# Patient Record
Sex: Female | Born: 1975 | Race: Black or African American | Hispanic: No | Marital: Married | Smoking: Never smoker
Health system: Southern US, Community
[De-identification: ages and names within clinical notes are randomized; demographics above are authoritative.]

## PROBLEM LIST (undated history)

## (undated) DIAGNOSIS — M26629 Arthralgia of temporomandibular joint, unspecified side: Secondary | ICD-10-CM

## (undated) DIAGNOSIS — N83209 Unspecified ovarian cyst, unspecified side: Secondary | ICD-10-CM

## (undated) DIAGNOSIS — L209 Atopic dermatitis, unspecified: Secondary | ICD-10-CM

## (undated) DIAGNOSIS — J0101 Acute recurrent maxillary sinusitis: Secondary | ICD-10-CM

## (undated) DIAGNOSIS — G43909 Migraine, unspecified, not intractable, without status migrainosus: Secondary | ICD-10-CM

## (undated) DIAGNOSIS — B019 Varicella without complication: Secondary | ICD-10-CM

## (undated) DIAGNOSIS — F419 Anxiety disorder, unspecified: Secondary | ICD-10-CM

## (undated) HISTORY — DX: Unspecified ovarian cyst, unspecified side: N83.209

## (undated) HISTORY — DX: Anxiety disorder, unspecified: F41.9

## (undated) HISTORY — DX: Migraine, unspecified, not intractable, without status migrainosus: G43.909

## (undated) HISTORY — DX: Atopic dermatitis, unspecified: L20.9

## (undated) HISTORY — DX: Acute recurrent maxillary sinusitis: J01.01

## (undated) HISTORY — DX: Varicella without complication: B01.9

## (undated) HISTORY — DX: Arthralgia of temporomandibular joint, unspecified side: M26.629

---
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
## 2008-08-17 ENCOUNTER — Ambulatory Visit: Payer: Self-pay | Admitting: Obstetrics and Gynecology

## 2008-08-23 ENCOUNTER — Encounter: Payer: Self-pay | Admitting: Obstetrics & Gynecology

## 2008-08-23 ENCOUNTER — Ambulatory Visit: Payer: Self-pay | Admitting: Obstetrics & Gynecology

## 2008-08-24 ENCOUNTER — Encounter: Payer: Self-pay | Admitting: Obstetrics & Gynecology

## 2008-08-30 ENCOUNTER — Ambulatory Visit: Payer: Self-pay | Admitting: Nurse Practitioner

## 2008-08-31 ENCOUNTER — Ambulatory Visit (HOSPITAL_COMMUNITY): Admission: RE | Admit: 2008-08-31 | Discharge: 2008-08-31 | Payer: Self-pay | Admitting: Obstetrics & Gynecology

## 2008-09-15 ENCOUNTER — Ambulatory Visit: Payer: Self-pay | Admitting: Obstetrics and Gynecology

## 2008-09-26 ENCOUNTER — Inpatient Hospital Stay (HOSPITAL_COMMUNITY): Admission: AD | Admit: 2008-09-26 | Discharge: 2008-09-26 | Payer: Self-pay | Admitting: Obstetrics & Gynecology

## 2008-10-08 ENCOUNTER — Ambulatory Visit: Payer: Self-pay | Admitting: Advanced Practice Midwife

## 2008-10-08 ENCOUNTER — Inpatient Hospital Stay (HOSPITAL_COMMUNITY): Admission: AD | Admit: 2008-10-08 | Discharge: 2008-10-09 | Payer: Self-pay | Admitting: Obstetrics and Gynecology

## 2008-10-11 ENCOUNTER — Ambulatory Visit: Payer: Self-pay | Admitting: Family Medicine

## 2008-10-18 ENCOUNTER — Ambulatory Visit: Payer: Self-pay | Admitting: Obstetrics & Gynecology

## 2008-10-21 ENCOUNTER — Inpatient Hospital Stay (HOSPITAL_COMMUNITY): Admission: AD | Admit: 2008-10-21 | Discharge: 2008-10-28 | Payer: Self-pay | Admitting: Family Medicine

## 2008-10-21 ENCOUNTER — Ambulatory Visit: Payer: Self-pay | Admitting: Obstetrics and Gynecology

## 2008-10-24 ENCOUNTER — Encounter: Payer: Self-pay | Admitting: Obstetrics & Gynecology

## 2008-10-28 ENCOUNTER — Encounter: Payer: Self-pay | Admitting: Family Medicine

## 2008-10-29 ENCOUNTER — Other Ambulatory Visit: Payer: Self-pay | Admitting: Emergency Medicine

## 2008-10-29 ENCOUNTER — Ambulatory Visit: Payer: Self-pay | Admitting: Physician Assistant

## 2008-10-29 ENCOUNTER — Inpatient Hospital Stay (HOSPITAL_COMMUNITY): Admission: AD | Admit: 2008-10-29 | Discharge: 2008-10-29 | Payer: Self-pay | Admitting: Obstetrics & Gynecology

## 2008-10-31 ENCOUNTER — Encounter: Payer: Self-pay | Admitting: Family Medicine

## 2008-10-31 ENCOUNTER — Ambulatory Visit (HOSPITAL_COMMUNITY): Admission: RE | Admit: 2008-10-31 | Discharge: 2008-10-31 | Payer: Self-pay | Admitting: Family Medicine

## 2008-11-03 ENCOUNTER — Ambulatory Visit: Payer: Self-pay | Admitting: Obstetrics and Gynecology

## 2008-11-08 ENCOUNTER — Ambulatory Visit: Payer: Self-pay | Admitting: Family Medicine

## 2008-11-08 ENCOUNTER — Encounter: Payer: Self-pay | Admitting: Obstetrics & Gynecology

## 2008-11-08 LAB — CONVERTED CEMR LAB
MCHC: 33.3 g/dL (ref 30.0–36.0)
MCV: 88.7 fL (ref 78.0–100.0)
Platelets: 279 10*3/uL (ref 150–400)
RDW: 13.8 % (ref 11.5–15.5)
WBC: 10.4 10*3/uL (ref 4.0–10.5)

## 2008-11-14 ENCOUNTER — Ambulatory Visit (HOSPITAL_COMMUNITY): Admission: RE | Admit: 2008-11-14 | Discharge: 2008-11-14 | Payer: Self-pay | Admitting: Family Medicine

## 2008-11-16 ENCOUNTER — Ambulatory Visit: Payer: Self-pay | Admitting: Obstetrics & Gynecology

## 2008-11-22 ENCOUNTER — Ambulatory Visit: Payer: Self-pay | Admitting: Obstetrics & Gynecology

## 2008-12-01 ENCOUNTER — Ambulatory Visit: Payer: Self-pay | Admitting: Obstetrics & Gynecology

## 2008-12-08 ENCOUNTER — Ambulatory Visit: Payer: Self-pay | Admitting: Obstetrics & Gynecology

## 2008-12-15 ENCOUNTER — Ambulatory Visit: Payer: Self-pay | Admitting: Obstetrics & Gynecology

## 2008-12-21 ENCOUNTER — Ambulatory Visit: Payer: Self-pay | Admitting: Obstetrics and Gynecology

## 2009-01-05 ENCOUNTER — Ambulatory Visit: Payer: Self-pay | Admitting: Obstetrics & Gynecology

## 2009-01-06 ENCOUNTER — Encounter: Payer: Self-pay | Admitting: Obstetrics & Gynecology

## 2009-01-06 LAB — CONVERTED CEMR LAB: GC Probe Amp, Genital: NEGATIVE

## 2009-01-09 ENCOUNTER — Ambulatory Visit: Payer: Self-pay | Admitting: Obstetrics and Gynecology

## 2009-01-12 ENCOUNTER — Ambulatory Visit: Payer: Self-pay | Admitting: Obstetrics & Gynecology

## 2009-01-19 ENCOUNTER — Ambulatory Visit: Payer: Self-pay | Admitting: Obstetrics & Gynecology

## 2009-01-24 ENCOUNTER — Ambulatory Visit: Payer: Self-pay | Admitting: Obstetrics & Gynecology

## 2009-01-24 ENCOUNTER — Inpatient Hospital Stay (HOSPITAL_COMMUNITY): Admission: AD | Admit: 2009-01-24 | Discharge: 2009-01-26 | Payer: Self-pay | Admitting: Obstetrics & Gynecology

## 2009-01-28 ENCOUNTER — Encounter: Admission: RE | Admit: 2009-01-28 | Discharge: 2009-02-26 | Payer: Self-pay | Admitting: Obstetrics & Gynecology

## 2009-01-30 ENCOUNTER — Ambulatory Visit: Admission: RE | Admit: 2009-01-30 | Discharge: 2009-01-30 | Payer: Self-pay | Admitting: Obstetrics & Gynecology

## 2009-02-02 ENCOUNTER — Ambulatory Visit: Payer: Self-pay | Admitting: Family Medicine

## 2009-02-02 LAB — CONVERTED CEMR LAB
ALT: 24 units/L (ref 0–35)
AST: 16 units/L (ref 0–37)
Alkaline Phosphatase: 132 units/L — ABNORMAL HIGH (ref 39–117)
BUN: 13 mg/dL (ref 6–23)
Creatinine, Ser: 0.89 mg/dL (ref 0.40–1.20)
HCT: 40.8 % (ref 36.0–46.0)
MCHC: 34.1 g/dL (ref 30.0–36.0)
MCV: 88.9 fL (ref 78.0–100.0)
Platelets: 308 10*3/uL (ref 150–400)
RDW: 14.3 % (ref 11.5–15.5)
Total Bilirubin: 0.7 mg/dL (ref 0.3–1.2)

## 2009-02-08 ENCOUNTER — Ambulatory Visit: Payer: Self-pay | Admitting: Obstetrics and Gynecology

## 2009-02-20 ENCOUNTER — Telehealth: Payer: Self-pay | Admitting: Family Medicine

## 2009-02-21 ENCOUNTER — Ambulatory Visit: Payer: Self-pay | Admitting: Family Medicine

## 2009-02-21 DIAGNOSIS — J0100 Acute maxillary sinusitis, unspecified: Secondary | ICD-10-CM | POA: Insufficient documentation

## 2009-02-21 DIAGNOSIS — S335XXA Sprain of ligaments of lumbar spine, initial encounter: Secondary | ICD-10-CM | POA: Insufficient documentation

## 2009-02-22 ENCOUNTER — Telehealth: Payer: Self-pay | Admitting: Family Medicine

## 2009-02-22 LAB — CONVERTED CEMR LAB
ALT: 18 units/L (ref 0–35)
AST: 21 units/L (ref 0–37)
Albumin: 3.8 g/dL (ref 3.5–5.2)
Alkaline Phosphatase: 93 units/L (ref 39–117)
BUN: 11 mg/dL (ref 6–23)
CO2: 31 meq/L (ref 19–32)
Chloride: 105 meq/L (ref 96–112)
Cholesterol: 195 mg/dL (ref 0–200)
Creatinine, Ser: 0.8 mg/dL (ref 0.4–1.2)
LDL Cholesterol: 139 mg/dL — ABNORMAL HIGH (ref 0–99)
Potassium: 4.1 meq/L (ref 3.5–5.1)
VLDL: 9.8 mg/dL (ref 0.0–40.0)

## 2009-02-23 ENCOUNTER — Telehealth: Payer: Self-pay | Admitting: Family Medicine

## 2009-03-08 ENCOUNTER — Encounter: Payer: Self-pay | Admitting: Family Medicine

## 2009-03-09 ENCOUNTER — Telehealth: Payer: Self-pay | Admitting: Family Medicine

## 2009-03-09 ENCOUNTER — Ambulatory Visit: Payer: Self-pay | Admitting: Obstetrics and Gynecology

## 2009-03-09 ENCOUNTER — Encounter: Payer: Self-pay | Admitting: Family Medicine

## 2009-03-09 DIAGNOSIS — M542 Cervicalgia: Secondary | ICD-10-CM | POA: Insufficient documentation

## 2009-03-09 LAB — CONVERTED CEMR LAB
Hemoglobin: 14.4 g/dL (ref 12.0–15.0)
MCHC: 36.2 g/dL — ABNORMAL HIGH (ref 30.0–36.0)
MCV: 84.3 fL (ref 78.0–100.0)
RBC: 4.72 M/uL (ref 3.87–5.11)
WBC: 5.3 10*3/uL (ref 4.0–10.5)

## 2009-03-21 ENCOUNTER — Encounter: Payer: Self-pay | Admitting: Family Medicine

## 2009-03-30 ENCOUNTER — Encounter: Admission: RE | Admit: 2009-03-30 | Discharge: 2009-04-29 | Payer: Self-pay | Admitting: Obstetrics & Gynecology

## 2009-04-21 ENCOUNTER — Telehealth: Payer: Self-pay | Admitting: Family Medicine

## 2009-04-30 ENCOUNTER — Encounter: Admission: RE | Admit: 2009-04-30 | Discharge: 2009-05-30 | Payer: Self-pay | Admitting: Obstetrics & Gynecology

## 2009-06-28 ENCOUNTER — Telehealth: Payer: Self-pay | Admitting: Family Medicine

## 2009-06-29 ENCOUNTER — Ambulatory Visit: Payer: Self-pay | Admitting: Family Medicine

## 2009-06-29 ENCOUNTER — Encounter: Admission: RE | Admit: 2009-06-29 | Discharge: 2009-07-29 | Payer: Self-pay | Admitting: Obstetrics & Gynecology

## 2009-06-29 DIAGNOSIS — S139XXA Sprain of joints and ligaments of unspecified parts of neck, initial encounter: Secondary | ICD-10-CM | POA: Insufficient documentation

## 2009-07-03 ENCOUNTER — Telehealth: Payer: Self-pay | Admitting: Family Medicine

## 2009-07-13 ENCOUNTER — Telehealth: Payer: Self-pay | Admitting: Family Medicine

## 2009-07-14 ENCOUNTER — Encounter (INDEPENDENT_AMBULATORY_CARE_PROVIDER_SITE_OTHER): Payer: Self-pay | Admitting: *Deleted

## 2009-07-20 ENCOUNTER — Encounter: Payer: Self-pay | Admitting: Family Medicine

## 2009-07-21 ENCOUNTER — Telehealth: Payer: Self-pay | Admitting: Family Medicine

## 2009-07-21 ENCOUNTER — Encounter: Payer: Self-pay | Admitting: Family Medicine

## 2009-07-30 ENCOUNTER — Encounter
Admission: RE | Admit: 2009-07-30 | Discharge: 2009-08-09 | Payer: Self-pay | Source: Home / Self Care | Admitting: Obstetrics & Gynecology

## 2009-08-01 ENCOUNTER — Encounter: Payer: Self-pay | Admitting: Family Medicine

## 2009-08-02 ENCOUNTER — Encounter: Payer: Self-pay | Admitting: Family Medicine

## 2009-08-08 ENCOUNTER — Encounter: Payer: Self-pay | Admitting: Family Medicine

## 2010-01-09 ENCOUNTER — Ambulatory Visit: Payer: Self-pay | Admitting: Nurse Practitioner

## 2010-04-03 ENCOUNTER — Ambulatory Visit
Admission: RE | Admit: 2010-04-03 | Discharge: 2010-04-03 | Payer: Self-pay | Source: Home / Self Care | Attending: Nurse Practitioner | Admitting: Nurse Practitioner

## 2010-04-03 NOTE — Progress Notes (Signed)
Summary: ? Bad cold  Phone Note Call from Patient Call back at Home Phone [phone]   Caller: Patient Call For: Ruthe Mannan MD Summary of Call: Patient called and stated that she has a bad cold.  She has no fever, no sore throat, body aches, chills, no nausea, no vomiting, no diarrhea.  Leaving to go out of town on "Sunday and wanted to know if Dr. Aron could call her in another Zpak because that is what she had before and it worked.  Advised that we usually do not call in antibiotics without seeing the patient first but would sent the message to Dr. Aron. [facility] Initial call taken by: Nikki Thorpe CMA (AAMA),  April 21, 2009 11:28 AM  Follow-up for Phone Call        I don't typicall do that but since she is going out of town, will do this ONE time.  Needs to be evaluated if worsening sputum production, fevers, or shortness of breath. Follow-up by: Talia Aron MD,  April 21, 2009 11:41 AM    New/Updated Medications: AZITHROMYCIN 250 MG  TABS (AZITHROMYCIN) 2 by  mouth today and then 1 daily for 4 days Prescriptions: AZITHROMYCIN 250 MG  TABS (AZITHROMYCIN) 2 by  mouth today and then 1 daily for 4 days  #6 x 0   Entered and Authorized by:   Talia Aron MD   Signed by:   Talia Aron MD on 04/21/2009   Method used:   Electronically [facility]        [facility] [id number]       [address]       [place], [place]  [address]       Ph: [phone] or [phone]       Fax: [phone]   RxID:   [id number]   Appended Document: ? Bad cold Patient Advised.

## 2010-04-03 NOTE — Miscellaneous (Signed)
Summary: PT Certification/[facility]  PT Certification/[facility]   Imported By: Lanelle Bal 08/07/2009 10:42:25  _____________________________________________________________________  External Attachment:    Type:   Image     Comment:   External Document

## 2010-04-03 NOTE — Progress Notes (Signed)
Summary: Muscle spasms  Phone Note Call from Patient Call back at Home Phone [phone]   Caller: Patient Call For: Ruthe Mannan MD Summary of Call: Patient say that for the last several days she has been having muscle spasms in her neck.  Doesn't remember pulling or injuring it in any way.  She has been taking Tylenol and Flexeril but neither are helping the pain.  Taking long hot showers and using a heating pad and still no relief.  [facility].  Please advise. Initial call taken by: Linde Gillis CMA Duncan Dull),  June 28, 2009 11:46 AM  Follow-up for Phone Call        If muscle relaxants, NSAIDs and hot showers not helping much, no other meds that could really help at this point.  Needs to be seen. Ruthe Mannan MD  June 28, 2009 11:56 AM   Additional Follow-up for Phone Call Additional follow up Details #1::        Patient advised as instructed, scheduled her to see Dr. Dayton Martes tomorrow at 2:45. Additional Follow-up by: Linde Gillis CMA Duncan Dull),  June 28, 2009 1:05 PM

## 2010-04-03 NOTE — Miscellaneous (Signed)
Summary: PT Discharge/[facility]  PT Discharge/ [facility]   Imported By: Lanelle Bal 08/16/2009 09:56:02  _____________________________________________________________________  External Attachment:    Type:   Image     Comment:   External Document

## 2010-04-03 NOTE — Progress Notes (Signed)
Summary: neck pain is not any better  Phone Note Call from Patient Call back at Home Phone [phone]   Caller: Patient Call For: Ruthe Mannan MD Summary of Call: Pt is taking soma and vicodin for muscle spasms in her neck.  She said this is not helping, she has not seen any improvement in her pain.  [facility]. Initial call taken by: Lowella Petties CMA,  Jul 03, 2009 9:42 AM  Follow-up for Phone Call        We could try PT next if she is interested. Ruthe Mannan MD  Jul 03, 2009 9:43 AM  Patient Advised.  She would like to do PT.  Please send referral to [facility].  Patient is coming into the office later this a.m. to bring her daughter.  Lugene Fuquay CMA Duncan Dull)  Jul 03, 2009 10:34 AM

## 2010-04-03 NOTE — Progress Notes (Signed)
Summary: requests refill on soma  Phone Note Call from Patient Call back at Home Phone [phone]   Caller: Patient Summary of Call: Pt has been taking soma for neck pain but she is out.  She still has the pain but it is getting better, she will be starting PT next week.  She is asking if refill on soma can be [facility]. Initial call taken by: Lowella Petties CMA,  Jul 13, 2009 12:30 PM  Follow-up for Phone Call        Skelaxin was called in 10 days ago. (another muscle relaxant)  I don't routinely use Soma.   OK to use Skelaxin and continue with supportive care for now. Will forward to Dr. Dayton Martes to see if she is comfortable refilling SOMA (controlled substance) Follow-up by: Hannah Beat MD,  Jul 13, 2009 12:37 PM  Additional Follow-up for Phone Call Additional follow up Details #1::        Patient says that the Skelaxin did not work for her and thats why she wants the [place] refill.Consuello Masse CMA  Additional Follow-up by: Benny Lennert CMA Duncan Dull),  Jul 13, 2009 2:50 PM    Additional Follow-up for Phone Call Additional follow up Details #2::    No. Controlled and highly abused substance. Will have to wait for Dr. Dayton Martes to refill.  cc: Dr. Dayton Martes Follow-up by: Hannah Beat MD,  Jul 13, 2009 2:56 PM  Additional Follow-up for Phone Call Additional follow up Details #3:: Details for Additional Follow-up Action Taken: Pt called back, is angry that soma has been denied.  She doesnt want anything else, says skelaxin and flexeril dont  work.  Insisting on soma.  She says she will come in for appt if she needs to.               Lowella Petties CMA  Jul 14, 2009 8:55 AM  Spoke with dr Ermalene Searing she is agreeable to fill temporary rx of soma as long as patient brings skelaxin back in.Consuello Masse CMA   Spoke with patient and phyllis also about office policy and procedure she will bring medication (skelaxin) to off and we will refill soma until dr Dayton Martes is  back.Consuello Masse CMA   Patient came in office and returned 28 of 30 skelaxin in original bottle. Dr. Ermalene Searing gave 15 pills until dr Dayton Martes returns.Consuello Masse CMA  Additional Follow-up by: Benny Lennert CMA Duncan Dull),  Jul 14, 2009 4:34 PM  Prescriptions: SOMA 250 MG TABS (CARISOPRODOL) 1 tab by mouth three times a day and at bedtime.  #15 x 0   Entered and Authorized by:   Kerby Nora MD   Signed by:   Kerby Nora MD on 07/14/2009   Method used:   Print then Give to Patient   RxID:   [id number]   Appended Document: requests refill on soma Patient was educated on these two medications not supposed to be taken together patietn took pills with her and took to pharmacy to see if they would take them back. She called back and said that pharamcy would not take medication back and she was going to discard this medication her self. Patient was told not to take the medications together.Consuello Masse CMA

## 2010-04-03 NOTE — Miscellaneous (Signed)
Summary: PT Certification/[facility]  PT Certification/[facility]   Imported By: Lanelle Bal 07/28/2009 10:14:23  _____________________________________________________________________  External Attachment:    Type:   Image     Comment:   External Document
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
## 2010-04-03 NOTE — Progress Notes (Signed)
Summary: soma  Phone Note Refill Request Call back at Home Phone [phone] Message from:  Patient on Jul 21, 2009 11:23 AM  Refills Requested: Medication #1:  SOMA 250 MG TABS 1 tab by mouth three times a day and at bedtime.  Method Requested: Pick up at Office Initial call taken by: Melody Comas,  Jul 21, 2009 11:23 AM  Follow-up for Phone Call        Rx called to pharmacy, patient notified.   Follow-up by: Linde Gillis CMA Duncan Dull),  Jul 21, 2009 11:50 AM    Prescriptions: SOMA 250 MG TABS (CARISOPRODOL) 1 tab by mouth three times a day and at bedtime.  #30 x 0   Entered and Authorized by:   Ruthe Mannan MD   Signed by:   Ruthe Mannan MD on 07/21/2009   Method used:   Telephoned to ...       [facility] [facility]       [address]       [place], [place]  [address]       Ph: [phone] or [phone]       Fax: [phone]   RxID:   [id number]

## 2010-04-03 NOTE — Letter (Signed)
Summary: Generic Letter  [facility]  [address] [place], [place] [address]   Phone: [phone]  Fax: [phone]    07/14/2009  [name] [address] [name], [place]  [address]  To Whom it may concern,   The above named patient returned a prescription of Skelaxin 800mg  to our office at [facility] in [place]. The Patient had only taken to tablets out of the prescription and come to find that they did not work for her. Patient had to be perscribe another rx that does work for her. Again 28 of the 30 tablets in the rx had been returned and counted in the present of the patient. If you have any furthur questions please contact our office at [phone].          Sincerely,   Benny Lennert CMA (AAMA)

## 2010-04-03 NOTE — Assessment & Plan Note (Signed)
Summary: MUSCLE SPASMS/NT   Vital Signs:  Patient profile:   35 year old female Height:      65.5 inches Weight:      178.25 pounds BMI:     29.32 Temp:     98.3 degrees F oral Pulse rate:   80 / minute Pulse rhythm:   regular BP sitting:   112 / 70  (left arm) Cuff size:   regular  Vitals Entered By: Delilah Shan CMA Duncan Dull) (June 29, 2009 3:04 PM) CC: Muscle spasm   History of Present Illness: 35 yo here for acute muscle spasm.  Woke up with sore right side of neck 1 week ago.  Hurts to turn head to right or any sudden movements. No radiculopathy or focal neurological deficits. Has tried Flexeril 10 mg and Tylenol with no relief of symptoms.  Current Medications (verified): 1)  Prenavite Multiple Vitamin 28-0.8 Mg Tabs (Prenatal Vit-Fe Fumarate-Fa) .... Take 1 Tablet By Mouth Once A Day 2)  Soma 250 Mg Tabs (Carisoprodol) .Marland Kitchen.. 1 Tab By Mouth Three Times A Day and At Bedtime. 3)  Vicodin 5-500 Mg Tabs (Hydrocodone-Acetaminophen) .Marland Kitchen.. 1 Tab Every Six Hours As Needed For Pain.  Allergies: 1)  ! Penicillin  Review of Systems      See HPI MS:  Complains of stiffness. Neuro:  Denies tingling and weakness.  Physical Exam  General:  Well-developed,well-nourished,in no acute distress; alert,appropriate and cooperative throughout examination Msk:  right trapezium tightness,  Full ROM. Neurologic:  No cranial nerve deficits noted. Station and gait are normal. Plantar reflexes are down-going bilaterally. DTRs are symmetrical throughout. Sensory, motor and coordinative functions appear intact.   Impression & Recommendations:  Problem # 1:  CERVICAL SPASM (ICD-847.0) Assessment New Failed Flexeril. Will try Soma, Vicodin as needed pain. Follow up in 1 week if no improvement. Her updated medication list for this problem includes:    Soma 250 Mg Tabs (Carisoprodol) .Marland Kitchen... 1 tab by mouth three times a day and at bedtime.    Vicodin 5-500 Mg Tabs (Hydrocodone-acetaminophen)  .Marland Kitchen... 1 tab every six hours as needed for pain.  Complete Medication List: 1)  Prenavite Multiple Vitamin 28-0.8 Mg Tabs (Prenatal vit-fe fumarate-fa) .... Take 1 tablet by mouth once a day 2)  Soma 250 Mg Tabs (Carisoprodol) .Marland Kitchen.. 1 tab by mouth three times a day and at bedtime. 3)  Vicodin 5-500 Mg Tabs (Hydrocodone-acetaminophen) .Marland Kitchen.. 1 tab every six hours as needed for pain. Prescriptions: VICODIN 5-500 MG TABS (HYDROCODONE-ACETAMINOPHEN) 1 tab every six hours as needed for pain.  #30 x 0   Entered and Authorized by:   Ruthe Mannan MD   Signed by:   Ruthe Mannan MD on 06/29/2009   Method used:   Print then Give to Patient   RxID:   [id number] SOMA 250 MG TABS (CARISOPRODOL) 1 tab by mouth three times a day and at bedtime.  #30 x 0   Entered and Authorized by:   Ruthe Mannan MD   Signed by:   Ruthe Mannan MD on 06/29/2009   Method used:   Print then Give to Patient   RxID:   [id number]   Current Allergies (reviewed today): ! PENICILLIN

## 2010-04-04 NOTE — Assessment & Plan Note (Unsigned)
[name], [name]         ACCOUNT NO.:  [id number]  MEDICAL RECORD NO.:  [id number]          PATIENT TYPE:  POB  LOCATION:  [facility]         FACILITY:  [facility]  PHYSICIAN:  Allie Bossier, MD        DATE OF BIRTH:  June 24, 1975  DATE OF SERVICE:  04/03/2010                                 CLINIC NOTE  The patient comes to the office today for a headache followup.  The patient was last seen in November 2011.  At that time, we did have a lengthy discussion concerning her headache and her anxiety.  The patient was encouraged at that time to try her best to do exercise, yoga, try some stress reduction techniques.  She was given a prescription for Imitrex as well as Norflex.  She was doing relatively well until about a week ago when her husband had an MI, very stressful situation for her. She also has a 37-year-old child and she really had a hard time.  So again after having yet another almost 45-minute discussion today, the patient has agreed to start on Effexor 75 mg one daily.  She is sleeping very poorly.  Dr. Penne Lash did call in some Ambien for her, which she has been taking and she would like to have a refill on and we will refill that for her.  She does not feel that the Imitrex is working well.  We will switch that over for some Relpax.  We spent a very long time today talking about the mechanism of action of all of these medications as well as migraine and an anxiety.  She did get a prescription for Vicodin and Phenergan 10 tablets each when she was having a difficult time.  I did give her a refill on those for rescue only.  She will return here in 4 weeks or sooner as need be.  Today on physical exam, cardiac was clear and lungs were regular.     Remonia Richter, NP   ______________________________ Allie Bossier, MD   LR/MEDQ  D:  04/03/2010  T:  04/04/2010  Job:  [id number]

## 2010-05-30 ENCOUNTER — Emergency Department: Payer: Self-pay | Admitting: Emergency Medicine

## 2010-06-06 LAB — CBC
HCT: 42.8 % (ref 36.0–46.0)
MCHC: 33.5 g/dL (ref 30.0–36.0)
MCV: 92.5 fL (ref 78.0–100.0)
RBC: 4.62 MIL/uL (ref 3.87–5.11)
WBC: 11 10*3/uL — ABNORMAL HIGH (ref 4.0–10.5)

## 2010-06-06 LAB — RPR: RPR Ser Ql: NONREACTIVE

## 2010-06-07 LAB — POCT URINALYSIS DIP (DEVICE)
Glucose, UA: NEGATIVE mg/dL
Hgb urine dipstick: NEGATIVE
Nitrite: NEGATIVE
Protein, ur: 30 mg/dL — AB
Protein, ur: NEGATIVE mg/dL
Specific Gravity, Urine: 1.02 (ref 1.005–1.030)
Specific Gravity, Urine: 1.025 (ref 1.005–1.030)
Urobilinogen, UA: 0.2 mg/dL (ref 0.0–1.0)
Urobilinogen, UA: 0.2 mg/dL (ref 0.0–1.0)
pH: 5.5 (ref 5.0–8.0)
pH: 6.5 (ref 5.0–8.0)

## 2010-06-08 LAB — POCT URINALYSIS DIP (DEVICE)
Hgb urine dipstick: NEGATIVE
Ketones, ur: NEGATIVE mg/dL
Nitrite: NEGATIVE
Protein, ur: 30 mg/dL — AB
Protein, ur: NEGATIVE mg/dL
Urobilinogen, UA: 0.2 mg/dL (ref 0.0–1.0)
Urobilinogen, UA: 0.2 mg/dL (ref 0.0–1.0)
pH: 6 (ref 5.0–8.0)
pH: 5.5 (ref 5.0–8.0)

## 2010-06-09 LAB — POCT I-STAT, CHEM 8
Calcium, Ion: 1.14 mmol/L (ref 1.12–1.32)
Chloride: 104 mEq/L (ref 96–112)
Creatinine, Ser: 0.7 mg/dL (ref 0.4–1.2)
Glucose, Bld: 91 mg/dL (ref 70–99)
Potassium: 3.5 mEq/L (ref 3.5–5.1)

## 2010-06-09 LAB — URINALYSIS, ROUTINE W REFLEX MICROSCOPIC
Bilirubin Urine: NEGATIVE
Bilirubin Urine: NEGATIVE
Bilirubin Urine: NEGATIVE
Glucose, UA: NEGATIVE mg/dL
Hgb urine dipstick: NEGATIVE
Hgb urine dipstick: NEGATIVE
Ketones, ur: NEGATIVE mg/dL
Ketones, ur: NEGATIVE mg/dL
Ketones, ur: NEGATIVE mg/dL
Nitrite: NEGATIVE
Protein, ur: NEGATIVE mg/dL
Protein, ur: NEGATIVE mg/dL
Protein, ur: NEGATIVE mg/dL
Urobilinogen, UA: 0.2 mg/dL (ref 0.0–1.0)
pH: 6.5 (ref 5.0–8.0)

## 2010-06-09 LAB — CBC
HCT: 37.1 % (ref 36.0–46.0)
Hemoglobin: 13.3 g/dL (ref 12.0–15.0)
Hemoglobin: 12.6 g/dL (ref 12.0–15.0)
MCHC: 33.7 g/dL (ref 30.0–36.0)
MCHC: 34.1 g/dL (ref 30.0–36.0)
MCV: 91.9 fL (ref 78.0–100.0)
RBC: 4.28 MIL/uL (ref 3.87–5.11)
RDW: 13.5 % (ref 11.5–15.5)
RDW: 13.5 % (ref 11.5–15.5)

## 2010-06-09 LAB — LACTIC ACID, PLASMA: Lactic Acid, Venous: 0.8 mmol/L (ref 0.5–2.2)

## 2010-06-10 LAB — WET PREP, GENITAL
Clue Cells Wet Prep HPF POC: NONE SEEN
Trich, Wet Prep: NONE SEEN
Yeast Wet Prep HPF POC: NONE SEEN

## 2010-06-10 LAB — URINALYSIS, ROUTINE W REFLEX MICROSCOPIC
Glucose, UA: NEGATIVE mg/dL
Ketones, ur: NEGATIVE mg/dL
pH: 6 (ref 5.0–8.0)

## 2010-07-09 ENCOUNTER — Other Ambulatory Visit: Payer: Self-pay | Admitting: Family Medicine

## 2010-07-09 MED ORDER — AZITHROMYCIN 250 MG PO TABS: ORAL_TABLET | ORAL | Status: AC

## 2010-07-13 ENCOUNTER — Telehealth: Payer: Self-pay | Admitting: *Deleted

## 2010-07-13 NOTE — Telephone Encounter (Signed)
Triage Record Num: [id number] Operator: April Finney Patient Name: [name] Call Date & Time: 07/12/2010 7:14:54PM Patient Phone: [phone] PCP: Elsie Lincoln (Mat Adm) Patient Gender: Female PCP Fax : [phone] Patient DOB: 1975-07-12 Practice Name: [facility] Reason for Call: Iysis calling about abd pain and extreme nausea. She is on a Z-pak and started it on 07/09/10. Now having diarrhea, vomiting, and abd pain feels like deep boring severe pain. See in ED care advice given per abdominal pain guidelines. Protocol(s) Used: Abdominal Pain Recommended Outcome per Protocol: See ED Immediately Reason for Outcome: Pain described as deep, boring, or tearing Care Advice: ~ Another adult should drive. ~ Do not give the patient anything to eat or drink. ~ Do not push on abdomen. ~ IMMEDIATE ACTION Write down provider's name. List or place the following in a bag for transport with the patient: current prescription and/or nonprescription medications; alternative treatments, therapies and medications; and street drugs. ~ 07/12/2010 7:29:19PM Page 1 of 1 [email or web address]

## 2010-07-13 NOTE — Telephone Encounter (Signed)
Called [name] at phone number listed below to check on her. Left voicemail to call us back and let us know how she is feeling.  I am quite sure she has received Zpack in past (PCN allergic). Hopefully symptoms have resolved.

## 2010-07-17 NOTE — Assessment & Plan Note (Signed)
NAME:  [name], [name]         ACCOUNT NO.:  [id number]   MEDICAL RECORD NO.:  [id number]          PATIENT TYPE:  POB   LOCATION:  [facility]         FACILITY:  [facility]   PHYSICIAN:  Scheryl Darter, MD       DATE OF BIRTH:  1975-04-05   DATE OF SERVICE:                                  CLINIC NOTE   The patient comes to the office today for consultation on her migraine  headaches.  Prior to seeing the patient in a room, the patient had a  episode, where she felt somewhat fainted.  She did eat breakfast, but  she did not have any fluids this morning.  Her blood pressure was taken  at that time and it was 68/30.  She was brought into a room and  subsequently began to feel better.  Her blood pressure was retaken at it  was 104/60.   This patient was referred by Dr. Elsie Lincoln.  The patient is exactly  [redacted] weeks pregnant today.  She has ultrasound scheduled for tomorrow.  She has an estimated due date of January 31, 2009.  She is 35 year old,  G1, P0.  She has had migraines since the age of 29.  She was sent to see  Dr. Harriet Butte, who is a well-known Headache Specialist in [place] for  years.  She was on Elavil as well as anti-inflammatories and Imitrex and  she did very well.  She may have had an aura as a teenager, but has had  no aura in at least the past 20 years.  She was doing well until very  recently.  Approximately 2 weeks ago, she moved from [place] to  [place].  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  She and  her husband moved to [place], [place].  She has a new house and  has stress with moving.  During her first trimester, she was quite ill  with nausea.  She was given Zofran, which actually caused her to have a  headache.  Her migraine is located in her left occipital and left  temple.  This past week, she had a rather severe headache that started  on Tuesday and did not end until Saturday.  She describes the pain as  10/10,  taking Tylenol was not helpful.  She did not take any other  medications.  She states today that she does not have any headache.  The  patient has no other medical illnesses.  The patient recognizes triggers  for headache is changing her estrogen, stress, and heat.   PHYSICAL EXAMINATION:  GENERAL:  Well-developed, well-nourished 33-year-  old African American female in no acute distress.  HEENT:  Head is normocephalic and atraumatic.  Pupils are equal and  react  CARDIAC:  Regular rate and rhythm.  LUNGS:  Clear bilaterally.  Fetal heart tones were heard at 148.  NEUROLOGIC:  The patient is alert and oriented.  She is lying down  following her fainting episode.  She is coherent.  Her thoughts are  clear and concise.  She is well coordinated with good muscle sensation.   ASSESSMENT:  1. Migraine without aura.  2. A 18 weeks' pregnant.  PLAN:  1. We had a 45-minute discussion today, but we have come to the      conclusion is that she would like to use Phenergan on an as-needed      basis.  She was given a prescription for 25 mg 1.5 to 1 tablet q.6      h. p.r.n., #30 with 1 refill.  2. She will also be given Darvocet-N 100 one q.6 h. p.r.n. pain #30      with 1 refill.  3. The patient is encouraged to seek counseling as need be.  4. The patient is asked to return on an as-needed basis.  If her      headaches do improved or if she is not happy with the medications      that we have used for her, she will be being seen on a routine      basis for her pregnancy and she can certainly talk to Dr. Penne Lash      at any point about her headaches.      Remonia Richter, NP    ______________________________  Scheryl Darter, MD    LR/MEDQ  D:  08/30/2008  T:  08/31/2008  Job:  [id number]

## 2010-07-17 NOTE — Assessment & Plan Note (Signed)
NAMEMILLIE, FORDE         ACCOUNT NO.:  [id number]   MEDICAL RECORD NO.:  [id number]          PATIENT TYPE:  POB   LOCATION:  [facility]         FACILITY:  [facility]   PHYSICIAN:  Catalina Antigua, MD     DATE OF BIRTH:  1975/06/23   DATE OF SERVICE:                                  CLINIC NOTE   This is a 35 year old G1, P1 status post vaginal delivery on January 25, 2009, who presents today for her postpartum visit.  The patient has  been complaining of excessive fatigue and some dizzy spells.  The  patient reports that although she is receiving adequate support from her  husband, she is still not getting adequate sleep and at time is not  really eating well secondary to time issues.  Her mother does plan on  returning to assist her in the next couple of weeks.  The patient also  reports having her last menstrual period starting on March 05, 2009.  She described it as being a little bit heavier than her norm, but  otherwise, denies passage of clots.  The patient is concerned that she  may have anemia.  The patient has been sexually active approximately a  week ago without any complaints, using condoms.  The patient denies any  signs and symptoms of postpartum depression and other than the excessive  fatigue, reports doing well.  She denies any past medical or past  surgical history.   PAST GYN HISTORY:  No cyst or fibroids or any abnormal Pap smear.   SOCIAL HISTORY:  She denies drinking, smoking or the use of illicit drug  use.   FAMILY HISTORY:  Significant for heart disease in her father.   REVIEW OF SYSTEMS:  Otherwise negative.   PHYSICAL EXAMINATION:  VITAL SIGNS:  Blood pressure 112/72, pulse of 83,  weight of 175 pounds, height of 5 feet 5 inches.  LUNGS:  Clear to auscultation bilaterally.  HEART:  Regular rate and rhythm.  ABDOMEN:  Soft, nontender, nondistended.  GENITALIA:  On bimanual exam, she had small anteverted uterus.  No  palpable adnexal  masses or tenderness.   This is a 35 year old G1, P1 status post vaginal delivery on January 25, 2009, who is here for postpartum exam.  The patient requested birth  control pill for contraception.  The patient was previously using Ortho-  Tri-Cyclen Lo without any complaints.  Prescription was provided to her.  As far as her dizziness and generalized fatigue is concerned, the  patient was advised to try to rest as much as she can and make sure she  stays well hydrated and if she cannot have 3 big meals every day, to  make sure that she continuously snacks on high-protein items such as  cheese, crackers, peanut butter, bread.  A CBC was also ordered to rule  out anemia.  The patient is still taking her prenatal  vitamins and was encouraged to continue doing so as she is still breast-  feeding.  The patient will return in June 2011 for annual exam or p.r.n.  as needed.  The patient will be contacted with any abnormal results.  ______________________________  Catalina Antigua, MD     PC/MEDQ  D:  03/09/2009  T:  03/10/2009  Job:  [id number]

## 2010-07-17 NOTE — Assessment & Plan Note (Signed)
NAME:  [name], [name]         ACCOUNT NO.:  [id number]   MEDICAL RECORD NO.:  [id number]          PATIENT TYPE:  POB   LOCATION:  [facility] at [facility]         FACILITY:  [facility]   PHYSICIAN:  Tinnie Gens, MD        DATE OF BIRTH:  June 05, 1975   DATE OF SERVICE:  01/09/2010                                  CLINIC NOTE   The patient comes to office today for followup on her migraine  headaches.  The patient was last seen back in June of 2010, when she was  20 weeks' pregnant since then she has delivered and has a 74-year-old.  She started having headaches again with the stress surrounding her  daughter having to have an MRI of her head.  The MRI turned out to be  negative and she has felt better since then, but she would like to have  something to take when she gets a migraine.  She also has a lot of  tension in her neck.  Will like to have something to take on an as-  needed basis around bedtime for tension in her neck.   PHYSICAL EXAMINATION:  VITAL SIGNS:  Blood pressure 119/78, pulse was  98, weight is 176, height is 5 feet 5-1/2 inches.  GENERAL:  A well developed, well nourished 35 year old African American  female in no acute distress.  HEENT:  Head is normocephalic and atraumatic.  Pupils equal and react.  NEUROLOGIC:  The patient is alert, oriented.  She is somewhat anxious.  She has good muscle tone and good coordination and good sensation.   ASSESSMENT:  1. Migraine.  2. Muscle spasm.   PLAN:  We had a good talk today concerning her headaches and her  anxiety.  She is strongly encouraged to exercise, do meditation, prayer,  yoga for stress reduction.  She will also be given a prescription for  Imitrex 100 mg 1 p.o. p.r.n. migraine #9 with p.r.n. refills.  She is  okay to repeat it once in 2 hours.  She will also be given Norflex 100  mg 1 p.o. bedtime p.r.n. #30 with 3 refills.  She can return to the  office on an as needed basis.      Remonia Richter, NP    ______________________________  Tinnie Gens, MD    LR/MEDQ  D:  01/09/2010  T:  01/10/2010  Job:  [id number]

## 2010-07-17 NOTE — Assessment & Plan Note (Signed)
NAME:  [name], [name]         ACCOUNT NO.:  [id number]   MEDICAL RECORD NO.:  [id number]          PATIENT TYPE:  POB   LOCATION:  [facility]         FACILITY:  [facility]   PHYSICIAN:  Tinnie Gens, MD        DATE OF BIRTH:  1975/06/24   DATE OF SERVICE:  02/02/2009                                  CLINIC NOTE   CHIEF COMPLAINT:  Lower extremity swelling, questionably elevated blood  pressure .   HISTORY OF PRESENT ILLNESS:  The patient is a 35 year old gravida 1,  para 1, who is approximately 8 days postpartum from a normal vaginal  delivery.  She had a 6 pounds 3 ounces female.  She took the baby in to  see Dr. Denny Peon who noted that her feet were swollen and recommended that  she come get her feet checked out.  The patient also had a regular nurse  come to her house and she reported that her blood pressure was increased  and that her top number was 138.  She is not sure what the bottom number  was.  The patient also reports headache that has been there since she  was pushing.  This headache is not worse with standing or recumbency.  She has a history of migraines in the past.  She has been taking  Vicodin, which has not seemed to help.  The patient is breast-feeding  and pumping, doing very little in the way of sleeping.   On exam today, her vitals are as noted in the chart.  Her blood pressure  is 123/83 and pulse was 81.  She has 2-3+ pedal edema bilaterally.   IMPRESSION:  Pedal edema probably normal for postpartum, however, given  the patient's angst headache and questionably mildly elevated blood  pressure, we will check associated labs.  We will start her on 12.5 of  hydrochlorothiazide.  I have asked her to increase her water so as to  not affect her milk supply.  We will see her back next week for repeat  blood pressure check.  I have given only 15 days worth of  hydrochlorothiazide as I do not think she will need much of that.     ______________________________  Tinnie Gens, MD     TP/MEDQ  D:  02/02/2009  T:  02/03/2009  Job:  [id number]

## 2010-07-17 NOTE — Discharge Summary (Signed)
NAMESANDY, [name]         ACCOUNT NO.:  [id number]   MEDICAL RECORD NO.:  [id number]          PATIENT TYPE:  INP   LOCATION:  9151                          [facility]ACILITY[facility]  [facility]   PHYSICIAN:  Scheryl Darter, MD       DATE OF BIRTH:  1975/03/19   DATE OF ADMISSION:  10/08/2008  DATE OF DISCHARGE:  10/09/2008                               DISCHARGE SUMMARY   DIAGNOSES:  1. Intrauterine pregnancy at 23 weeks' 5 days' gestation.  2. Ultrasound showed tunneling of the internal os with threatened      preterm labor.   HISTORY OF PRESENT ILLNESS:  The patient is a 35 year old black female,  gravida 1, para 0.  Estimated date of confinement is January 31, 2009,  currently at 23 weeks' 5 days' gestation who presented with left lower  quadrant pain that is characterized as grabbing.  No rupture of  membranes or bleeding.  She notes the fetal movement.   PAST MEDICAL HISTORY:  Migraine.   PAST SURGICAL HISTORY:  None.   SOCIAL HISTORY:  The patient is married.  Nonsmoker.  She denies alcohol  or drug use.  She recently moved from [place] to [place].  Her  prenatal care was at [facility].   MEDICATIONS:  1. Darvocet p.r.n. headache.  2. Prenatal vitamin.  3. Ibuprofen 400 mg p.o. p.r.n.  4. Phenergan 25 mg q.6 h. p.r.n. headache.   ALLERGIES:  PENICILLIN and PERCOCET.   PHYSICAL EXAMINATION:  GENERAL:  The patient is not in acute distress,  but with some mild discomfort.  CHEST:  Clear.  HEART:  Regular rate and rhythm.  ABDOMEN:  Gravid, consistent with dates.  PELVIC:  Normal external genitalia.  No vaginal discharge or bleeding.  Cervical os is closed and cervix is warm.  An ultrasound which showed  tunneling of the cervix was about 2.3 cm.  Internal os was closed.  An  ultrasound monitoring showed some uterine contractions consistent with  possible preterm labor.  The patient was admitted for observation for  possible preterm labor.  She was started  on Procardia XL 30 mg a day.  During hospitalization, she exhibited only occasional uterine  irritability.  Cervix was unchanged on October 09, 2008, on my physical  examination.  The patient had a negative fetal fibronectin 1 week ago at  [facility].   IMPRESSION:  Intrauterine pregnancy 23 weeks' 5 days' gestation with  threatened preterm labor with ultrasound findings of tunneling of the  internal cervical os.   PLAN:  The patient was given preterm labor precautions and she was  discharged with decreased activity.  She will continue with Procardia XL  30 mg daily.  She can continue with her other medications for headaches.  She has an  appointment at [facility] on October 12, 2008, at which time we will  review her symptoms, and she will be having her 1-hour blood glucose  testing at that time.  At that time, we can discuss whether she will be  able to travel to [place] in 8 days for a wedding.  Scheryl Darter, MD  Electronically Signed     JA/MEDQ  D:  10/09/2008  T:  10/09/2008  Job:  [id number]

## 2010-07-17 NOTE — Discharge Summary (Signed)
[name], [name]         ACCOUNT NO.:  [id number]   MEDICAL RECORD NO.:  [id number]          PATIENT TYPE:  INP   LOCATION:  9154                          [facility]ACILITY[facility]  [facility]   PHYSICIAN:  Catalina Antigua, MD     DATE OF BIRTH:  [birth year]   DATE OF ADMISSION:  10/21/2008  DATE OF DISCHARGE:  10/28/2008                               DISCHARGE SUMMARY   PRIMARY CARE [name]:  The patient is being followed at the [facility]   DISCHARGE DIAGNOSES:  1. Incompetent cervix.  2. Pregnancy.  3. Headache.   DISCHARGE MEDICATIONS:  1. Prometrium 200 mg per vagina at bedtime.  2. Procardia 30 mg 1 p.o. daily.  3. Ambien 5 mg 1 p.o. at bedtime p.r.n. insomnia.  4. Prenatal vitamin 1 p.o. daily.  5. Flexeril 10 mg p.o. q.8 h.  6. Fioricet 1 p.o. q.4 h. p.r.n. headache.   CONSULTS:  MFM was consulted.   PROCEDURES:  The patient underwent transvaginal ultrasound on August  27th, August 23rd, and August 20th.   LABORATORY DATA:  Fetal fibronectin negative.  Urinanalysis negative for  nitrites and leukocytes.   BRIEF HOSPITAL COURSE:  The patient is a 35 year old, G1, P0 presenting  at 78 and 3 weeks on October 21, 2008, sent in for an ultrasound, which  showed funneling of the internal os and contractions.  The patient had  received betamethasone x2, and was placed on Procardia.  She was  observed then follow up ultrasound showed shortened cervix.  The patient  was admitted, continued on Procardia, started on Prometrium.  The  patient had serial ultrasounds, which showed no change in the cervical  length, and the patient was deemed stable for discharge with close  followup.   DISCHARGE INSTRUCTIONS:  The patient is to maintain bedrest with  bathroom privileges, bedside commode, and shower chair, pelvic rest.   FOLLOWUP APPOINTMENTS:  The patient is to be seen at the [facility], November 03, 2008, at 7:45 a.m.  The patient is also to follow  up with MFM for  ultrasound.  The appointment is to be scheduled.  MFM  will call us back.   DISCHARGE CONDITION:  The patient was discharged to home in stable  medical condition.      Eula Fried, MD      Catalina Antigua, MD  Electronically Signed    DS/MEDQ  D:  10/28/2008  T:  10/28/2008  Job:  [id number]

## 2010-09-25 ENCOUNTER — Ambulatory Visit (INDEPENDENT_AMBULATORY_CARE_PROVIDER_SITE_OTHER): Payer: Self-pay | Admitting: Nurse Practitioner

## 2010-09-25 ENCOUNTER — Encounter: Payer: Self-pay | Admitting: Nurse Practitioner

## 2010-09-25 VITALS — BP 104/72 | HR 96 | Ht 65.0 in | Wt 165.0 lb

## 2010-09-25 DIAGNOSIS — G43009 Migraine without aura, not intractable, without status migrainosus: Secondary | ICD-10-CM | POA: Insufficient documentation

## 2010-09-25 DIAGNOSIS — G4700 Insomnia, unspecified: Secondary | ICD-10-CM

## 2010-09-25 MED ORDER — ZOLPIDEM TARTRATE 10 MG PO TABS: 10.0000 mg | ORAL_TABLET | Freq: Every evening | ORAL | Status: DC | PRN

## 2010-09-25 MED ORDER — VENLAFAXINE HCL ER 37.5 MG PO CP24: 37.5000 mg | ORAL_CAPSULE | Freq: Three times a day (TID) | ORAL | Status: DC

## 2010-09-25 MED ORDER — HYDROCODONE-ACETAMINOPHEN 7.5-750 MG PO TABS: 1.0000 | ORAL_TABLET | Freq: Four times a day (QID) | ORAL | Status: DC | PRN

## 2010-09-25 MED ORDER — RIZATRIPTAN BENZOATE 10 MG PO TABS: 10.0000 mg | ORAL_TABLET | Freq: Once | ORAL | Status: DC

## 2010-09-25 MED ORDER — PROMETHAZINE HCL 25 MG PO TABS: 25.0000 mg | ORAL_TABLET | Freq: Four times a day (QID) | ORAL | Status: DC | PRN

## 2010-09-25 MED ORDER — NAPROXEN SODIUM 550 MG PO TABS: 550.0000 mg | ORAL_TABLET | Freq: Two times a day (BID) | ORAL | Status: DC

## 2010-09-25 NOTE — Progress Notes (Signed)
  Subjective:    Patient ID: [name], female    DOB: 18-Aug-1975, 35 y.o.   MRN: [id number]  HPI Office visit for migraine headache. Pt was asked to return in March and did not keep that appointment. Was doing very well with effexor as preventative until father-in- law became ill. At that time headaches got worse and she called in for Maxalt RX. She had intolerance of Imitrex an had insurance issues with Relpax. Had to use phenergan and vicoden for rescue. Still uses Ambien from time to time for difficulty falling asleep. Considering going back to school for [employer]   Review of SystemsDoing well otherwise     Objective:   Physical Exam Negative neuro exam       Assessment & Plan:  A: Discussed going up on prevention from effexor 75mg  to 37.5mg  TID daily. Also adding anaprox to maxalt. Ok to refill ambien , phenergan and vicoden.

## 2010-10-03 ENCOUNTER — Ambulatory Visit: Payer: Self-pay

## 2010-10-30 ENCOUNTER — Telehealth: Payer: Self-pay | Admitting: Nurse Practitioner

## 2010-10-30 NOTE — Telephone Encounter (Signed)
Pt was seen one month ago and given 30 Vicoden for rescue. This was intended to last for longer than one month. If she needs to increase her prevention she will need another visit. She may call in the mean time if she needs assistance. FYI

## 2010-11-27 ENCOUNTER — Encounter: Payer: Self-pay | Admitting: Nurse Practitioner

## 2010-11-27 DIAGNOSIS — G43109 Migraine with aura, not intractable, without status migrainosus: Secondary | ICD-10-CM

## 2011-01-21 ENCOUNTER — Telehealth: Payer: Self-pay

## 2011-01-21 NOTE — Telephone Encounter (Signed)
Patient needs a refill on her medicines, on Vicodin, her ambien and phenergan. Her pharmacy is [facility] in [place]. Thanks!

## 2011-01-22 NOTE — Telephone Encounter (Signed)
Pt requested medication refill in Aug, she was told at that time she would need office visit to discuss headaches. She did not make an appointment. Will need appointment before any further meds are given.

## 2011-01-22 NOTE — Telephone Encounter (Signed)
Spoke to patient and she understands that she needs an appointment prior to having medications refilled.  She will make an appointment for when she returns from out of town.

## 2011-02-19 ENCOUNTER — Encounter: Payer: Self-pay | Admitting: Nurse Practitioner

## 2011-02-27 ENCOUNTER — Ambulatory Visit (INDEPENDENT_AMBULATORY_CARE_PROVIDER_SITE_OTHER): Payer: Self-pay | Admitting: Nurse Practitioner

## 2011-02-27 ENCOUNTER — Encounter: Payer: Self-pay | Admitting: Nurse Practitioner

## 2011-02-27 DIAGNOSIS — G4700 Insomnia, unspecified: Secondary | ICD-10-CM

## 2011-02-27 DIAGNOSIS — G43909 Migraine, unspecified, not intractable, without status migrainosus: Secondary | ICD-10-CM

## 2011-02-27 DIAGNOSIS — G43009 Migraine without aura, not intractable, without status migrainosus: Secondary | ICD-10-CM

## 2011-02-27 MED ORDER — VENLAFAXINE HCL ER 150 MG PO CP24: 150.0000 mg | ORAL_CAPSULE | Freq: Every day | ORAL | Status: DC

## 2011-02-27 MED ORDER — IBUPROFEN 800 MG PO TABS: 800.0000 mg | ORAL_TABLET | Freq: Three times a day (TID) | ORAL | Status: DC | PRN

## 2011-02-27 MED ORDER — ZOLPIDEM TARTRATE 10 MG PO TABS: 10.0000 mg | ORAL_TABLET | Freq: Every evening | ORAL | Status: DC | PRN

## 2011-02-27 NOTE — Patient Instructions (Signed)

## 2011-02-27 NOTE — Progress Notes (Signed)
Pt was last seen September 21, 2010. She did not keep her follow up appointment. She has called in several times for refills on ambien, vicoden and phenergan. She was denied these at the last request and advised she must have office visit. Today in off ice she states she is continuing Effexor 37.5 TID for prevention. She is currently not taking BCPs and is not trying to prevent pregnancy. She continues to complain of sleep issues including difficulty falling asleep up to 4 hours and difficulty maintaining sleep. She has been taking ambien and can not sleep without it. She has multiple excuses for not taking maxalt including money issues. Pt was told at end of visit that she would not be given vicoden on monthly basis for headache. She must take preventions and use maxalt at the beginning of headache. She became upset and left office.  [facility]armac[facility] at [facility] to say that she has not been filling her Effexor just [place]  PE: Very talkative, difficult to keep focused on point.  Became upset and left prior to exam  A: Migraine Anxiety  P: We had another 45-50 minute visit today. Again difficult to keep her on the point at hand. We discussed going up on effexor to 150mg , though the pharmacist called to say she is not taking. We discussed taking Motrin TID prior to menses and migraine. Suggest sleep study for extreme sleep issue. Refill ambien. Will not refill vicoden.

## 2011-02-27 NOTE — Progress Notes (Signed)
Patients menses has become increasingly crampy and heavy and she seems to be having more headaches at this time as well.Marland KitchenMarland Kitchen

## 2011-03-25 ENCOUNTER — Ambulatory Visit (HOSPITAL_BASED_OUTPATIENT_CLINIC_OR_DEPARTMENT_OTHER): Payer: BC Managed Care – PPO | Attending: Nurse Practitioner | Admitting: Radiology

## 2011-03-25 VITALS — Ht 65.0 in | Wt 160.0 lb

## 2011-03-25 DIAGNOSIS — G4733 Obstructive sleep apnea (adult) (pediatric): Secondary | ICD-10-CM

## 2011-03-25 DIAGNOSIS — G4700 Insomnia, unspecified: Secondary | ICD-10-CM | POA: Insufficient documentation

## 2011-03-25 DIAGNOSIS — G4730 Sleep apnea, unspecified: Secondary | ICD-10-CM | POA: Insufficient documentation

## 2011-03-30 DIAGNOSIS — G4700 Insomnia, unspecified: Secondary | ICD-10-CM

## 2011-03-30 DIAGNOSIS — G4730 Sleep apnea, unspecified: Secondary | ICD-10-CM

## 2011-03-30 NOTE — Procedures (Signed)
NAME:  [name], [name]         ACCOUNT NO.:  [id number]  MEDICAL RECORD NO.:  [id number]          PATIENT TYPE:  OUT  LOCATION:  SLEEP CENTER                 FACILITY:  [facility]  PHYSICIAN:  Clinton D. Maple Hudson, MD, FCCP, FACPDATE OF BIRTH:  [birth year]  DATE OF STUDY:  03/25/2011                           NOCTURNAL POLYSOMNOGRAM  REFERRING PHYSICIAN:  LINDA MILLER BAREFOOT  INDICATION FOR STUDY:  Insomnia with sleep apnea.  EPWORTH SLEEPINESS SCORE:  9/24.  BMI 26.6, weight 160 pounds, height 65 inches, neck 12 inches.  MEDICATIONS:  Home medications are charted and reviewed.  SLEEP ARCHITECTURE:  Total sleep time 350 minutes with sleep efficiency 93.6%.  Stage I was 2.3%, stage II 60.1%, stage III 14.3%, REM 23.3% of total sleep time.  Sleep latency 97.0, REM latency 97.5 minutes.  Awake after sleep onset 16 minutes.  Arousal index 5.5.  BEDTIME MEDICATION:  Ambien.  RESPIRATORY DATA:  Apnea-hypopnea index (AHI) 0.9 per hour. A total of 5 events was scored, all as hypopneas, non positional.  REM/AHI 0.7 per hour.  OXYGEN DATA:  Moderate snoring with oxygen desaturation to a nadir of 86% and a mean oxygen saturation through the study of 96.9% on room air.  CARDIAC DATA:  Normal sinus rhythm.  MOVEMENT-PARASOMNIA:  No significant movement disturbance.  No bathroom trips.  IMPRESSIONS-RECOMMENDATIONS: 1. Unremarkable sleep architecture for Sleep Center environment after     Ambien. 2. Occasional respiratory event with sleep disturbance, within normal     limits.  Apnea-hypopnea index 0.9 per     hour (the normal range for adult is from 0-5 per hour).  Moderate     snoring with oxygen desaturation to a nadir of 86% and a mean     oxygen saturation through the study of 96.9% on room air.     Clinton D. Maple Hudson, MD, [facility], FACP Diplomate, Biomedical engineer of Sleep Medicine Electronically Signed    CDY/MEDQ  D:  03/30/2011 15:06:39  T:  03/30/2011 20:54:00  Job:  [id number]

## 2011-04-01 ENCOUNTER — Ambulatory Visit: Payer: PRIVATE HEALTH INSURANCE | Admitting: Family Medicine

## 2011-04-02 ENCOUNTER — Ambulatory Visit: Payer: PRIVATE HEALTH INSURANCE | Admitting: Nurse Practitioner

## 2011-04-02 DIAGNOSIS — R51 Headache: Secondary | ICD-10-CM

## 2011-04-23 ENCOUNTER — Ambulatory Visit (INDEPENDENT_AMBULATORY_CARE_PROVIDER_SITE_OTHER): Payer: PRIVATE HEALTH INSURANCE | Admitting: Nurse Practitioner

## 2011-04-23 ENCOUNTER — Encounter: Payer: Self-pay | Admitting: Nurse Practitioner

## 2011-04-23 DIAGNOSIS — G43909 Migraine, unspecified, not intractable, without status migrainosus: Secondary | ICD-10-CM

## 2011-04-23 NOTE — Patient Instructions (Signed)

## 2011-04-23 NOTE — Progress Notes (Signed)
S: Pt came to office today for follow up on migraines. Pt was confronted with information that was called from pharmacy that she was not taking daily preventive Effexor or Maxalt. She became upset and stated  "You would not say these things if I were a white woman". Inetta Fermo Nogues was in room to witness interview. Pt was told this was a counterproductive visit and that we no longer needed to have a provider/ patient relationship. She asked for and was given results of Sleep Apnea test. She is advised we will forward her records to whom ever she chooses to care for her headaches.   O: 35 YO AA female in NAD  A: Migraine headache  P: Sleep study results given which were negative Records available upon request

## 2011-06-24 ENCOUNTER — Ambulatory Visit (INDEPENDENT_AMBULATORY_CARE_PROVIDER_SITE_OTHER): Payer: PRIVATE HEALTH INSURANCE | Admitting: *Deleted

## 2011-06-24 DIAGNOSIS — Z111 Encounter for screening for respiratory tuberculosis: Secondary | ICD-10-CM

## 2011-06-27 ENCOUNTER — Other Ambulatory Visit (INDEPENDENT_AMBULATORY_CARE_PROVIDER_SITE_OTHER): Payer: PRIVATE HEALTH INSURANCE | Admitting: *Deleted

## 2011-06-27 DIAGNOSIS — Z111 Encounter for screening for respiratory tuberculosis: Secondary | ICD-10-CM

## 2011-06-27 NOTE — Progress Notes (Signed)
Tb test reads negative in Right forearm.  Paperwork filled out and given to patient.

## 2011-07-24 ENCOUNTER — Ambulatory Visit (INDEPENDENT_AMBULATORY_CARE_PROVIDER_SITE_OTHER): Payer: PRIVATE HEALTH INSURANCE | Admitting: Obstetrics & Gynecology

## 2011-07-24 ENCOUNTER — Encounter: Payer: Self-pay | Admitting: Obstetrics & Gynecology

## 2011-07-24 VITALS — BP 100/77 | HR 110 | Ht 65.0 in | Wt 178.0 lb

## 2011-07-24 DIAGNOSIS — B3731 Acute candidiasis of vulva and vagina: Secondary | ICD-10-CM

## 2011-07-24 DIAGNOSIS — G4700 Insomnia, unspecified: Secondary | ICD-10-CM

## 2011-07-24 DIAGNOSIS — Z01419 Encounter for gynecological examination (general) (routine) without abnormal findings: Secondary | ICD-10-CM

## 2011-07-24 DIAGNOSIS — R1084 Generalized abdominal pain: Secondary | ICD-10-CM

## 2011-07-24 DIAGNOSIS — G43009 Migraine without aura, not intractable, without status migrainosus: Secondary | ICD-10-CM

## 2011-07-24 DIAGNOSIS — Z124 Encounter for screening for malignant neoplasm of cervix: Secondary | ICD-10-CM

## 2011-07-24 DIAGNOSIS — B373 Candidiasis of vulva and vagina: Secondary | ICD-10-CM

## 2011-07-24 MED ORDER — ZOLPIDEM TARTRATE 10 MG PO TABS: 10.0000 mg | ORAL_TABLET | Freq: Every evening | ORAL | Status: DC | PRN

## 2011-07-24 MED ORDER — FLUCONAZOLE 150 MG PO TABS: ORAL_TABLET | ORAL | Status: DC

## 2011-07-24 NOTE — Patient Instructions (Signed)
Preventive Care for Adults, Female A healthy lifestyle and preventive care can promote health and wellness. Preventive health guidelines for women include the following key practices.  A routine yearly physical is a good way to check with your caregiver about your health and preventive screening. It is a chance to share any concerns and updates on your health, and to receive a thorough exam.   Visit your dentist for a routine exam and preventive care every 6 months. Brush your teeth twice a day and floss once a day. Good oral hygiene prevents tooth decay and gum disease.   The frequency of eye exams is based on your age, health, family medical history, use of contact lenses, and other factors. Follow your caregiver's recommendations for frequency of eye exams.   Eat a healthy diet. Foods like vegetables, fruits, whole grains, low-fat dairy products, and lean protein foods contain the nutrients you need without too many calories. Decrease your intake of foods high in solid fats, added sugars, and salt. Eat the right amount of calories for you.Get information about a proper diet from your caregiver, if necessary.   Regular physical exercise is one of the most important things you can do for your health. Most adults should get at least 150 minutes of moderate-intensity exercise (any activity that increases your heart rate and causes you to sweat) each week. In addition, most adults need muscle-strengthening exercises on 2 or more days a week.   Maintain a healthy weight. The body mass index (BMI) is a screening tool to identify possible weight problems. It provides an estimate of body fat based on height and weight. Your caregiver can help determine your BMI, and can help you achieve or maintain a healthy weight.For adults 20 years and older:   A BMI below 18.5 is considered underweight.   A BMI of 18.5 to 24.9 is normal.   A BMI of 25 to 29.9 is considered overweight.   A BMI of 30 and above is  considered obese.   Maintain normal blood lipids and cholesterol levels by exercising and minimizing your intake of saturated fat. Eat a balanced diet with plenty of fruit and vegetables. Blood tests for lipids and cholesterol should begin at age 20 and be repeated every 5 years. If your lipid or cholesterol levels are high, you are over 50, or you are at high risk for heart disease, you may need your cholesterol levels checked more frequently.Ongoing high lipid and cholesterol levels should be treated with medicines if diet and exercise are not effective.   If you smoke, find out from your caregiver how to quit. If you do not use tobacco, do not start.   If you are pregnant, do not drink alcohol. If you are breastfeeding, be very cautious about drinking alcohol. If you are not pregnant and choose to drink alcohol, do not exceed 1 drink per day. One drink is considered to be 12 ounces (355 mL) of beer, 5 ounces (148 mL) of wine, or 1.5 ounces (44 mL) of liquor.   Avoid use of street drugs. Do not share needles with anyone. Ask for help if you need support or instructions about stopping the use of drugs.   High blood pressure causes heart disease and increases the risk of stroke. Your blood pressure should be checked at least every 1 to 2 years. Ongoing high blood pressure should be treated with medicines if weight loss and exercise are not effective.   If you are 55 to 38   years old, ask your caregiver if you should take aspirin to prevent strokes.   Diabetes screening involves taking a blood sample to check your fasting blood sugar level. This should be done once every 3 years, after age 45, if you are within normal weight and without risk factors for diabetes. Testing should be considered at a younger age or be carried out more frequently if you are overweight and have at least 1 risk factor for diabetes.   Breast cancer screening is essential preventive care for women. You should practice "breast  self-awareness." This means understanding the normal appearance and feel of your breasts and may include breast self-examination. Any changes detected, no matter how small, should be reported to a caregiver. Women in their 20s and 30s should have a clinical breast exam (CBE) by a caregiver as part of a regular health exam every 1 to 3 years. After age 40, women should have a CBE every year. Starting at age 40, women should consider having a mammography (breast X-ray test) every year. Women who have a family history of breast cancer should talk to their caregiver about genetic screening. Women at a high risk of breast cancer should talk to their caregivers about having magnetic resonance imaging (MRI) and a mammography every year.   The Pap test is a screening test for cervical cancer. A Pap test can show cell changes on the cervix that might become cervical cancer if left untreated. A Pap test is a procedure in which cells are obtained and examined from the lower end of the uterus (cervix).   Women should have a Pap test starting at age 21.   Between ages 21 and 29, Pap tests should be repeated every 2 years.   Beginning at age 30, you should have a Pap test every 3 years as long as the past 3 Pap tests have been normal.   Some women have medical problems that increase the chance of getting cervical cancer. Talk to your caregiver about these problems. It is especially important to talk to your caregiver if a new problem develops soon after your last Pap test. In these cases, your caregiver may recommend more frequent screening and Pap tests.   The above recommendations are the same for women who have or have not gotten the vaccine for human papillomavirus (HPV).   If you had a hysterectomy for a problem that was not cancer or a condition that could lead to cancer, then you no longer need Pap tests. Even if you no longer need a Pap test, a regular exam is a good idea to make sure no other problems are  starting.   If you are between ages 65 and 70, and you have had normal Pap tests going back 10 years, you no longer need Pap tests. Even if you no longer need a Pap test, a regular exam is a good idea to make sure no other problems are starting.   If you have had past treatment for cervical cancer or a condition that could lead to cancer, you need Pap tests and screening for cancer for at least 20 years after your treatment.   If Pap tests have been discontinued, risk factors (such as a new sexual partner) need to be reassessed to determine if screening should be resumed.   The HPV test is an additional test that may be used for cervical cancer screening. The HPV test looks for the virus that can cause the cell changes on the cervix.   The cells collected during the Pap test can be tested for HPV. The HPV test could be used to screen women aged 30 years and older, and should be used in women of any age who have unclear Pap test results. After the age of 30, women should have HPV testing at the same frequency as a Pap test.   Colorectal cancer can be detected and often prevented. Most routine colorectal cancer screening begins at the age of 50 and continues through age 75. However, your caregiver may recommend screening at an earlier age if you have risk factors for colon cancer. On a yearly basis, your caregiver may provide home test kits to check for hidden blood in the stool. Use of a small camera at the end of a tube, to directly examine the colon (sigmoidoscopy or colonoscopy), can detect the earliest forms of colorectal cancer. Talk to your caregiver about this at age 50, when routine screening begins. Direct examination of the colon should be repeated every 5 to 10 years through age 75, unless early forms of pre-cancerous polyps or small growths are found.   Hepatitis C blood testing is recommended for all people born from 1945 through 1965 and any individual with known risks for hepatitis C.    Practice safe sex. Use condoms and avoid high-risk sexual practices to reduce the spread of sexually transmitted infections (STIs). STIs include gonorrhea, chlamydia, syphilis, trichomonas, herpes, HPV, and human immunodeficiency virus (HIV). Herpes, HIV, and HPV are viral illnesses that have no cure. They can result in disability, cancer, and death. Sexually active women aged 25 and younger should be checked for chlamydia. Older women with new or multiple partners should also be tested for chlamydia. Testing for other STIs is recommended if you are sexually active and at increased risk.   Osteoporosis is a disease in which the bones lose minerals and strength with aging. This can result in serious bone fractures. The risk of osteoporosis can be identified using a bone density scan. Women ages 65 and over and women at risk for fractures or osteoporosis should discuss screening with their caregivers. Ask your caregiver whether you should take a calcium supplement or vitamin D to reduce the rate of osteoporosis.   Menopause can be associated with physical symptoms and risks. Hormone replacement therapy is available to decrease symptoms and risks. You should talk to your caregiver about whether hormone replacement therapy is right for you.   Use sunscreen with sun protection factor (SPF) of 30 or more. Apply sunscreen liberally and repeatedly throughout the day. You should seek shade when your shadow is shorter than you. Protect yourself by wearing long sleeves, pants, a wide-brimmed hat, and sunglasses year round, whenever you are outdoors.   Once a month, do a whole body skin exam, using a mirror to look at the skin on your back. Notify your caregiver of new moles, moles that have irregular borders, moles that are larger than a pencil eraser, or moles that have changed in shape or color.   Stay current with required immunizations.   Influenza. You need a dose every fall (or winter). The composition of  the flu vaccine changes each year, so being vaccinated once is not enough.   Pneumococcal polysaccharide. You need 1 to 2 doses if you smoke cigarettes or if you have certain chronic medical conditions. You need 1 dose at age 65 (or older) if you have never been vaccinated.   Tetanus, diphtheria, pertussis (Tdap, Td). Get 1 dose of   Tdap vaccine if you are younger than age 65, are over 65 and have contact with an infant, are a healthcare worker, are pregnant, or simply want to be protected from whooping cough. After that, you need a Td booster dose every 10 years. Consult your caregiver if you have not had at least 3 tetanus and diphtheria-containing shots sometime in your life or have a deep or dirty wound.   HPV. You need this vaccine if you are a woman age 26 or younger. The vaccine is given in 3 doses over 6 months.   Measles, mumps, rubella (MMR). You need at least 1 dose of MMR if you were born in 1957 or later. You may also need a second dose.   Meningococcal. If you are age 19 to 21 and a first-year college student living in a residence hall, or have one of several medical conditions, you need to get vaccinated against meningococcal disease. You may also need additional booster doses.   Zoster (shingles). If you are age 60 or older, you should get this vaccine.   Varicella (chickenpox). If you have never had chickenpox or you were vaccinated but received only 1 dose, talk to your caregiver to find out if you need this vaccine.   Hepatitis A. You need this vaccine if you have a specific risk factor for hepatitis A virus infection or you simply wish to be protected from this disease. The vaccine is usually given as 2 doses, 6 to 18 months apart.   Hepatitis B. You need this vaccine if you have a specific risk factor for hepatitis B virus infection or you simply wish to be protected from this disease. The vaccine is given in 3 doses, usually over 6 months.  Preventive Services /  Frequency Ages 19 to 39  Blood pressure check.** / Every 1 to 2 years.   Lipid and cholesterol check.** / Every 5 years beginning at age 20.   Clinical breast exam.** / Every 3 years for women in their 20s and 30s.   Pap test.** / Every 2 years from ages 21 through 29. Every 3 years starting at age 30 through age 65 or 70 with a history of 3 consecutive normal Pap tests.   HPV screening.** / Every 3 years from ages 30 through ages 65 to 70 with a history of 3 consecutive normal Pap tests.   Hepatitis C blood test.** / For any individual with known risks for hepatitis C.   Skin self-exam. / Monthly.   Influenza immunization.** / Every year.   Pneumococcal polysaccharide immunization.** / 1 to 2 doses if you smoke cigarettes or if you have certain chronic medical conditions.   Tetanus, diphtheria, pertussis (Tdap, Td) immunization. / A one-time dose of Tdap vaccine. After that, you need a Td booster dose every 10 years.   HPV immunization. / 3 doses over 6 months, if you are 26 and younger.   Measles, mumps, rubella (MMR) immunization. / You need at least 1 dose of MMR if you were born in 1957 or later. You may also need a second dose.   Meningococcal immunization. / 1 dose if you are age 19 to 21 and a first-year college student living in a residence hall, or have one of several medical conditions, you need to get vaccinated against meningococcal disease. You may also need additional booster doses.   Varicella immunization.** / Consult your caregiver.   Hepatitis A immunization.** / Consult your caregiver. 2 doses, 6 to 18 months   apart.   Hepatitis B immunization.** / Consult your caregiver. 3 doses usually over 6 months.    ** Family history and personal history of risk and conditions may change your caregiver's recommendations. Document Released: 04/16/2001 Document Revised: 02/07/2011 Document Reviewed: 07/16/2010 [employer] Patient Information 2012 [employer]. 

## 2011-07-24 NOTE — Progress Notes (Signed)
  Subjective:     [name] is a 36 y.o.G1P1 female and is here for a comprehensive gynecologic exam. The patient reports problems - abdominal pain for a week which she relates to stress, associated with nausea, no diarrhea or constipation.  Also reports vulvar irritation shich she attributes to being on Effexor (which was prescribed for her migraines and anxiety), the vulvar irritation is associated with white discharge, makes it difficult and painful to have intercourse.  No other associated symptoms. Patient wants refill of her Ambien for insomnia.  History   Social History  . Marital Status: Married    Spouse Name: N/A    Number of Children: N/A  . Years of Education: N/A   Occupational History  . Not on file.   Social History Main Topics  . Smoking status: Never Smoker   . Smokeless tobacco: Never Used  . Alcohol Use: Yes     occassionally  . Drug Use: No  . Sexually Active: Yes -- Female partner(s)   Other Topics Concern  . Not on file   Social History Narrative  . No narrative on file   Health Maintenance  Topic Date Due  . Pap Smear  08/02/1991  . Tetanus/tdap  08/01/1992  . Influenza Vaccine  12/03/2011    The following portions of the patient's history were reviewed and updated as appropriate: allergies, current medications, past family history, past medical history, past social history, past surgical history and problem list.  Review of Systems Pertinent items are noted in HPI.   Objective:   Blood pressure 100/77, pulse 110, height 5\' 5"  (1.651 m), weight 178 lb (80.74 kg), last menstrual period 07/09/2011. GENERAL: Well-developed, well-nourished female in no acute distress.  HEENT: Normocephalic, atraumatic. Sclerae anicteric.  NECK: Supple. Normal thyroid.  LUNGS: Clear to auscultation bilaterally.  HEART: Regular rate and rhythm. BREASTS: Symmetric with everted nipples. No masses, skin changes, nipple drainage, or lymphadenopathy. ABDOMEN: Soft,  nondistended, no organomegaly, diffuse tenderness on palpation especially in bilateral upper quadrants, +voluntary guarding, no rebound. PELVIC: External female genitalia with some irritation noted around posterior fourchette and inner thighs. Vagina is pink and rugated with thick, white, clumpy discharge, wet prep sample obtained Normal cervix contour. Pap smear obtained, minimal bleeding after pap. Uterus is normal in size. No adnexal mass or tenderness.  EXTREMITIES: No cyanosis, clubbing, or edema, 2+ distal pulses.   Assessment:    Healthy female exam.  Candidal vulvovaginitis Abdominal pain     Plan:   Follow up pap smear Follow up wet prep; Diflucan e-prescribed for presumptive diagnosis of candidiasis. Likely unrelated to Effexor use, patient has since self-discontinued this medication.  Proper vulvar hygiene emphasized. Patient to follow up with PCP for further evaluation of abdominal pain, may need GI referral Ambien refilled as per patient request for her insomnia Return to clinic for any worsening gynecologic symptoms

## 2011-07-25 LAB — WET PREP, GENITAL

## 2011-11-12 ENCOUNTER — Telehealth: Payer: Self-pay | Admitting: Obstetrics & Gynecology

## 2011-11-12 DIAGNOSIS — G4700 Insomnia, unspecified: Secondary | ICD-10-CM

## 2011-11-12 DIAGNOSIS — G43009 Migraine without aura, not intractable, without status migrainosus: Secondary | ICD-10-CM

## 2011-11-12 MED ORDER — ZOLPIDEM TARTRATE 10 MG PO TABS: 10.0000 mg | ORAL_TABLET | Freq: Every evening | ORAL | Status: DC | PRN

## 2011-11-12 NOTE — Telephone Encounter (Signed)
Patient called requesting refill on her Ambien 10mg .  [facility] uses the [facility] on [address] in [place].

## 2011-11-12 NOTE — Telephone Encounter (Signed)
Ambien Rx called in for patient as per her request, with five refills. Patient will be called

## 2011-11-12 NOTE — Addendum Note (Signed)
Addended by: Jaynie Collins A on: 11/12/2011 02:05 PM   Modules accepted: Orders

## 2012-04-14 ENCOUNTER — Telehealth: Payer: Self-pay

## 2012-04-14 DIAGNOSIS — G43009 Migraine without aura, not intractable, without status migrainosus: Secondary | ICD-10-CM

## 2012-04-14 DIAGNOSIS — G4700 Insomnia, unspecified: Secondary | ICD-10-CM

## 2012-04-14 MED ORDER — ZOLPIDEM TARTRATE 10 MG PO TABS: 10.0000 mg | ORAL_TABLET | Freq: Every evening | ORAL | Status: DC | PRN

## 2012-04-14 NOTE — Telephone Encounter (Signed)
Hi Dr. Macon Large,   Patient is requesting refills on her Ambien 10mg . She is not due for her physical till May of this year, can you call more refills in till she gets in for her annual. [facility] [place]. Thanks

## 2012-04-14 NOTE — Addendum Note (Signed)
Addended by: Jaynie Collins A on: 04/14/2012 05:09 PM   Modules accepted: Orders

## 2012-04-14 NOTE — Telephone Encounter (Addendum)
Ambien refilled and called in as per patient's request.  She needs annual exam after 07/23/2012.

## 2012-05-11 DIAGNOSIS — K219 Gastro-esophageal reflux disease without esophagitis: Secondary | ICD-10-CM | POA: Insufficient documentation

## 2012-05-11 DIAGNOSIS — G43909 Migraine, unspecified, not intractable, without status migrainosus: Secondary | ICD-10-CM | POA: Insufficient documentation

## 2012-05-12 DIAGNOSIS — R29898 Other symptoms and signs involving the musculoskeletal system: Secondary | ICD-10-CM

## 2012-05-12 HISTORY — DX: Other symptoms and signs involving the musculoskeletal system: R29.898

## 2012-10-07 ENCOUNTER — Ambulatory Visit (INDEPENDENT_AMBULATORY_CARE_PROVIDER_SITE_OTHER): Payer: BC Managed Care – PPO | Admitting: Obstetrics and Gynecology

## 2012-10-07 ENCOUNTER — Encounter: Payer: Self-pay | Admitting: Obstetrics and Gynecology

## 2012-10-07 VITALS — BP 121/99 | HR 100 | Ht 65.5 in | Wt 184.0 lb

## 2012-10-07 DIAGNOSIS — Z01419 Encounter for gynecological examination (general) (routine) without abnormal findings: Secondary | ICD-10-CM

## 2012-10-07 DIAGNOSIS — G4700 Insomnia, unspecified: Secondary | ICD-10-CM

## 2012-10-07 DIAGNOSIS — G43009 Migraine without aura, not intractable, without status migrainosus: Secondary | ICD-10-CM

## 2012-10-07 DIAGNOSIS — Z1151 Encounter for screening for human papillomavirus (HPV): Secondary | ICD-10-CM

## 2012-10-07 DIAGNOSIS — Z124 Encounter for screening for malignant neoplasm of cervix: Secondary | ICD-10-CM

## 2012-10-07 MED ORDER — ZOLPIDEM TARTRATE 10 MG PO TABS: 10.0000 mg | ORAL_TABLET | Freq: Every evening | ORAL | Status: DC | PRN

## 2012-10-07 NOTE — Progress Notes (Signed)
  Subjective:     [name] is a 37 y.o. female G1P1 with LMP 10/05/2012 and BMI 30 who is here for a comprehensive physical exam. The patient reports vaginal irritation for 3 weeks. No discharge or pruritis  History   Social History  . Marital Status: Married    Spouse Name: N/A    Number of Children: N/A  . Years of Education: N/A   Occupational History  . Not on file.   Social History Main Topics  . Smoking status: Never Smoker   . Smokeless tobacco: Never Used  . Alcohol Use: Yes     Comment: occassionally  . Drug Use: No  . Sexually Active: Yes -- Female partner(s)   Other Topics Concern  . Not on file   Social History Narrative  . No narrative on file   Health Maintenance  Topic Date Due  . Tetanus/tdap  08/01/1992  . Influenza Vaccine  11/02/2012  . Pap Smear  07/24/2014   Past Medical History  Diagnosis Date  . Anxiety   . Migraine headache   . Muscle spasm 01/2010   History reviewed. No pertinent past surgical history. Family History  Problem Relation Age of Onset  . Hyperlipidemia Mother   . Heart disease Father   . Diabetes Father    History  Substance Use Topics  . Smoking status: Never Smoker   . Smokeless tobacco: Never Used  . Alcohol Use: Yes     Comment: occassionally       Review of Systems A comprehensive review of systems was negative.   Objective:      GENERAL: Well-developed, well-nourished female in no acute distress.  HEENT: Normocephalic, atraumatic. Sclerae anicteric.  NECK: Supple. Normal thyroid.  LUNGS: Clear to auscultation bilaterally.  HEART: Regular rate and rhythm. BREASTS: Symmetric in size. No palpable masses or lymphadenopathy, skin changes, or nipple drainage. ABDOMEN: Soft, nontender, nondistended. No organomegaly. PELVIC: Normal external female genitalia. Small teat at vaginal fourchette, tender to touch. Vagina is pink and rugated.  Normal discharge. Normal appearing cervix. Uterus is normal in size.  No adnexal mass or tenderness. EXTREMITIES: No cyanosis, clubbing, or edema, 2+ distal pulses.    Assessment:    Healthy female exam.      Plan:    Pap smear performed Wet prep collected Patient advised to pat dry after urination to allow healing to occur. Protect skin with ointment until completely healed Patient advised to perform monthly self breast and vulva exams Patient advised to continue her weight loss efforts with modifying diet and exercising as least 150 min/week Will refer to nutritionist for weight loss assistance See After Visit Summary for Counseling Recommendations

## 2012-10-07 NOTE — Patient Instructions (Signed)
Preventive Care for Adults, Female A healthy lifestyle and preventive care can promote health and wellness. Preventive health guidelines for women include the following key practices.  A routine yearly physical is a good way to check with your caregiver about your health and preventive screening. It is a chance to share any concerns and updates on your health, and to receive a thorough exam.  Visit your dentist for a routine exam and preventive care every 6 months. Brush your teeth twice a day and floss once a day. Good oral hygiene prevents tooth decay and gum disease.  The frequency of eye exams is based on your age, health, family medical history, use of contact lenses, and other factors. Follow your caregiver's recommendations for frequency of eye exams.  Eat a healthy diet. Foods like vegetables, fruits, whole grains, low-fat dairy products, and lean protein foods contain the nutrients you need without too many calories. Decrease your intake of foods high in solid fats, added sugars, and salt. Eat the right amount of calories for you.Get information about a proper diet from your caregiver, if necessary.  Regular physical exercise is one of the most important things you can do for your health. Most adults should get at least 150 minutes of moderate-intensity exercise (any activity that increases your heart rate and causes you to sweat) each week. In addition, most adults need muscle-strengthening exercises on 2 or more days a week.  Maintain a healthy weight. The body mass index (BMI) is a screening tool to identify possible weight problems. It provides an estimate of body fat based on height and weight. Your caregiver can help determine your BMI, and can help you achieve or maintain a healthy weight.For adults 20 years and older:  A BMI below 18.5 is considered underweight.  A BMI of 18.5 to 24.9 is normal.  A BMI of 25 to 29.9 is considered overweight.  A BMI of 30 and above is  considered obese.  Maintain normal blood lipids and cholesterol levels by exercising and minimizing your intake of saturated fat. Eat a balanced diet with plenty of fruit and vegetables. Blood tests for lipids and cholesterol should begin at age 20 and be repeated every 5 years. If your lipid or cholesterol levels are high, you are over 50, or you are at high risk for heart disease, you may need your cholesterol levels checked more frequently.Ongoing high lipid and cholesterol levels should be treated with medicines if diet and exercise are not effective.  If you smoke, find out from your caregiver how to quit. If you do not use tobacco, do not start.  If you are pregnant, do not drink alcohol. If you are breastfeeding, be very cautious about drinking alcohol. If you are not pregnant and choose to drink alcohol, do not exceed 1 drink per day. One drink is considered to be 12 ounces (355 mL) of beer, 5 ounces (148 mL) of wine, or 1.5 ounces (44 mL) of liquor.  Avoid use of street drugs. Do not share needles with anyone. Ask for help if you need support or instructions about stopping the use of drugs.  High blood pressure causes heart disease and increases the risk of stroke. Your blood pressure should be checked at least every 1 to 2 years. Ongoing high blood pressure should be treated with medicines if weight loss and exercise are not effective.  If you are 55 to 39 years old, ask your caregiver if you should take aspirin to prevent strokes.  Diabetes   screening involves taking a blood sample to check your fasting blood sugar level. This should be done once every 3 years, after age 45, if you are within normal weight and without risk factors for diabetes. Testing should be considered at a younger age or be carried out more frequently if you are overweight and have at least 1 risk factor for diabetes.  Breast cancer screening is essential preventive care for women. You should practice "breast  self-awareness." This means understanding the normal appearance and feel of your breasts and may include breast self-examination. Any changes detected, no matter how small, should be reported to a caregiver. Women in their 20s and 30s should have a clinical breast exam (CBE) by a caregiver as part of a regular health exam every 1 to 3 years. After age 40, women should have a CBE every year. Starting at age 40, women should consider having a mammography (breast X-ray test) every year. Women who have a family history of breast cancer should talk to their caregiver about genetic screening. Women at a high risk of breast cancer should talk to their caregivers about having magnetic resonance imaging (MRI) and a mammography every year.  The Pap test is a screening test for cervical cancer. A Pap test can show cell changes on the cervix that might become cervical cancer if left untreated. A Pap test is a procedure in which cells are obtained and examined from the lower end of the uterus (cervix).  Women should have a Pap test starting at age 21.  Between ages 21 and 29, Pap tests should be repeated every 2 years.  Beginning at age 30, you should have a Pap test every 3 years as long as the past 3 Pap tests have been normal.  Some women have medical problems that increase the chance of getting cervical cancer. Talk to your caregiver about these problems. It is especially important to talk to your caregiver if a new problem develops soon after your last Pap test. In these cases, your caregiver may recommend more frequent screening and Pap tests.  The above recommendations are the same for women who have or have not gotten the vaccine for human papillomavirus (HPV).  If you had a hysterectomy for a problem that was not cancer or a condition that could lead to cancer, then you no longer need Pap tests. Even if you no longer need a Pap test, a regular exam is a good idea to make sure no other problems are  starting.  If you are between ages 65 and 70, and you have had normal Pap tests going back 10 years, you no longer need Pap tests. Even if you no longer need a Pap test, a regular exam is a good idea to make sure no other problems are starting.  If you have had past treatment for cervical cancer or a condition that could lead to cancer, you need Pap tests and screening for cancer for at least 20 years after your treatment.  If Pap tests have been discontinued, risk factors (such as a new sexual partner) need to be reassessed to determine if screening should be resumed.  The HPV test is an additional test that may be used for cervical cancer screening. The HPV test looks for the virus that can cause the cell changes on the cervix. The cells collected during the Pap test can be tested for HPV. The HPV test could be used to screen women aged 30 years and older, and should   be used in women of any age who have unclear Pap test results. After the age of 30, women should have HPV testing at the same frequency as a Pap test.  Colorectal cancer can be detected and often prevented. Most routine colorectal cancer screening begins at the age of 50 and continues through age 75. However, your caregiver may recommend screening at an earlier age if you have risk factors for colon cancer. On a yearly basis, your caregiver may provide home test kits to check for hidden blood in the stool. Use of a small camera at the end of a tube, to directly examine the colon (sigmoidoscopy or colonoscopy), can detect the earliest forms of colorectal cancer. Talk to your caregiver about this at age 50, when routine screening begins. Direct examination of the colon should be repeated every 5 to 10 years through age 75, unless early forms of pre-cancerous polyps or small growths are found.  Hepatitis C blood testing is recommended for all people born from 1945 through 1965 and any individual with known risks for hepatitis C.  Practice  safe sex. Use condoms and avoid high-risk sexual practices to reduce the spread of sexually transmitted infections (STIs). STIs include gonorrhea, chlamydia, syphilis, trichomonas, herpes, HPV, and human immunodeficiency virus (HIV). Herpes, HIV, and HPV are viral illnesses that have no cure. They can result in disability, cancer, and death. Sexually active women aged 25 and younger should be checked for chlamydia. Older women with new or multiple partners should also be tested for chlamydia. Testing for other STIs is recommended if you are sexually active and at increased risk.  Osteoporosis is a disease in which the bones lose minerals and strength with aging. This can result in serious bone fractures. The risk of osteoporosis can be identified using a bone density scan. Women ages 65 and over and women at risk for fractures or osteoporosis should discuss screening with their caregivers. Ask your caregiver whether you should take a calcium supplement or vitamin D to reduce the rate of osteoporosis.  Menopause can be associated with physical symptoms and risks. Hormone replacement therapy is available to decrease symptoms and risks. You should talk to your caregiver about whether hormone replacement therapy is right for you.  Use sunscreen with sun protection factor (SPF) of 30 or more. Apply sunscreen liberally and repeatedly throughout the day. You should seek shade when your shadow is shorter than you. Protect yourself by wearing long sleeves, pants, a wide-brimmed hat, and sunglasses year round, whenever you are outdoors.  Once a month, do a whole body skin exam, using a mirror to look at the skin on your back. Notify your caregiver of new moles, moles that have irregular borders, moles that are larger than a pencil eraser, or moles that have changed in shape or color.  Stay current with required immunizations.  Influenza. You need a dose every fall (or winter). The composition of the flu vaccine  changes each year, so being vaccinated once is not enough.  Pneumococcal polysaccharide. You need 1 to 2 doses if you smoke cigarettes or if you have certain chronic medical conditions. You need 1 dose at age 65 (or older) if you have never been vaccinated.  Tetanus, diphtheria, pertussis (Tdap, Td). Get 1 dose of Tdap vaccine if you are younger than age 65, are over 65 and have contact with an infant, are a healthcare worker, are pregnant, or simply want to be protected from whooping cough. After that, you need a Td   booster dose every 10 years. Consult your caregiver if you have not had at least 3 tetanus and diphtheria-containing shots sometime in your life or have a deep or dirty wound.  HPV. You need this vaccine if you are a woman age 26 or younger. The vaccine is given in 3 doses over 6 months.  Measles, mumps, rubella (MMR). You need at least 1 dose of MMR if you were born in 1957 or later. You may also need a second dose.  Meningococcal. If you are age 19 to 21 and a first-year college student living in a residence hall, or have one of several medical conditions, you need to get vaccinated against meningococcal disease. You may also need additional booster doses.  Zoster (shingles). If you are age 60 or older, you should get this vaccine.  Varicella (chickenpox). If you have never had chickenpox or you were vaccinated but received only 1 dose, talk to your caregiver to find out if you need this vaccine.  Hepatitis A. You need this vaccine if you have a specific risk factor for hepatitis A virus infection or you simply wish to be protected from this disease. The vaccine is usually given as 2 doses, 6 to 18 months apart.  Hepatitis B. You need this vaccine if you have a specific risk factor for hepatitis B virus infection or you simply wish to be protected from this disease. The vaccine is given in 3 doses, usually over 6 months. Preventive Services / Frequency Ages 19 to 39  Blood  pressure check.** / Every 1 to 2 years.  Lipid and cholesterol check.** / Every 5 years beginning at age 20.  Clinical breast exam.** / Every 3 years for women in their 20s and 30s.  Pap test.** / Every 2 years from ages 21 through 29. Every 3 years starting at age 30 through age 65 or 70 with a history of 3 consecutive normal Pap tests.  HPV screening.** / Every 3 years from ages 30 through ages 65 to 70 with a history of 3 consecutive normal Pap tests.  Hepatitis C blood test.** / For any individual with known risks for hepatitis C.  Skin self-exam. / Monthly.  Influenza immunization.** / Every year.  Pneumococcal polysaccharide immunization.** / 1 to 2 doses if you smoke cigarettes or if you have certain chronic medical conditions.  Tetanus, diphtheria, pertussis (Tdap, Td) immunization. / A one-time dose of Tdap vaccine. After that, you need a Td booster dose every 10 years.  HPV immunization. / 3 doses over 6 months, if you are 26 and younger.  Measles, mumps, rubella (MMR) immunization. / You need at least 1 dose of MMR if you were born in 1957 or later. You may also need a second dose.  Meningococcal immunization. / 1 dose if you are age 19 to 21 and a first-year college student living in a residence hall, or have one of several medical conditions, you need to get vaccinated against meningococcal disease. You may also need additional booster doses.  Varicella immunization.** / Consult your caregiver.  Hepatitis A immunization.** / Consult your caregiver. 2 doses, 6 to 18 months apart.  Hepatitis B immunization.** / Consult your caregiver. 3 doses usually over 6 months. Ages 40 to 64  Blood pressure check.** / Every 1 to 2 years.  Lipid and cholesterol check.** / Every 5 years beginning at age 20.  Clinical breast exam.** / Every year after age 40.  Mammogram.** / Every year beginning at age 40   and continuing for as long as you are in good health. Consult with your  caregiver.  Pap test.** / Every 3 years starting at age 30 through age 65 or 70 with a history of 3 consecutive normal Pap tests.  HPV screening.** / Every 3 years from ages 30 through ages 65 to 70 with a history of 3 consecutive normal Pap tests.  Fecal occult blood test (FOBT) of stool. / Every year beginning at age 50 and continuing until age 75. You may not need to do this test if you get a colonoscopy every 10 years.  Flexible sigmoidoscopy or colonoscopy.** / Every 5 years for a flexible sigmoidoscopy or every 10 years for a colonoscopy beginning at age 50 and continuing until age 75.  Hepatitis C blood test.** / For all people born from 1945 through 1965 and any individual with known risks for hepatitis C.  Skin self-exam. / Monthly.  Influenza immunization.** / Every year.  Pneumococcal polysaccharide immunization.** / 1 to 2 doses if you smoke cigarettes or if you have certain chronic medical conditions.  Tetanus, diphtheria, pertussis (Tdap, Td) immunization.** / A one-time dose of Tdap vaccine. After that, you need a Td booster dose every 10 years.  Measles, mumps, rubella (MMR) immunization. / You need at least 1 dose of MMR if you were born in 1957 or later. You may also need a second dose.  Varicella immunization.** / Consult your caregiver.  Meningococcal immunization.** / Consult your caregiver.  Hepatitis A immunization.** / Consult your caregiver. 2 doses, 6 to 18 months apart.  Hepatitis B immunization.** / Consult your caregiver. 3 doses, usually over 6 months. Ages 65 and over  Blood pressure check.** / Every 1 to 2 years.  Lipid and cholesterol check.** / Every 5 years beginning at age 20.  Clinical breast exam.** / Every year after age 40.  Mammogram.** / Every year beginning at age 40 and continuing for as long as you are in good health. Consult with your caregiver.  Pap test.** / Every 3 years starting at age 30 through age 65 or 70 with a 3  consecutive normal Pap tests. Testing can be stopped between 65 and 70 with 3 consecutive normal Pap tests and no abnormal Pap or HPV tests in the past 10 years.  HPV screening.** / Every 3 years from ages 30 through ages 65 or 70 with a history of 3 consecutive normal Pap tests. Testing can be stopped between 65 and 70 with 3 consecutive normal Pap tests and no abnormal Pap or HPV tests in the past 10 years.  Fecal occult blood test (FOBT) of stool. / Every year beginning at age 50 and continuing until age 75. You may not need to do this test if you get a colonoscopy every 10 years.  Flexible sigmoidoscopy or colonoscopy.** / Every 5 years for a flexible sigmoidoscopy or every 10 years for a colonoscopy beginning at age 50 and continuing until age 75.  Hepatitis C blood test.** / For all people born from 1945 through 1965 and any individual with known risks for hepatitis C.  Osteoporosis screening.** / A one-time screening for women ages 65 and over and women at risk for fractures or osteoporosis.  Skin self-exam. / Monthly.  Influenza immunization.** / Every year.  Pneumococcal polysaccharide immunization.** / 1 dose at age 65 (or older) if you have never been vaccinated.  Tetanus, diphtheria, pertussis (Tdap, Td) immunization. / A one-time dose of Tdap vaccine if you are over   65 and have contact with an infant, are a healthcare worker, or simply want to be protected from whooping cough. After that, you need a Td booster dose every 10 years.  Varicella immunization.** / Consult your caregiver.  Meningococcal immunization.** / Consult your caregiver.  Hepatitis A immunization.** / Consult your caregiver. 2 doses, 6 to 18 months apart.  Hepatitis B immunization.** / Check with your caregiver. 3 doses, usually over 6 months. ** Family history and personal history of risk and conditions may change your caregiver's recommendations. Document Released: 04/16/2001 Document Revised: 05/13/2011  Document Reviewed: 07/16/2010 [employer] Patient Information 2014 [employer].  

## 2012-10-07 NOTE — Progress Notes (Signed)
Here today for vaginal irritation.  Has concerns with not being able to loose weight. Needs refill on her Ambien.

## 2012-10-08 LAB — WET PREP, GENITAL
Clue Cells Wet Prep HPF POC: NONE SEEN
Trich, Wet Prep: NONE SEEN
WBC, Wet Prep HPF POC: NONE SEEN
Yeast Wet Prep HPF POC: NONE SEEN

## 2012-10-28 ENCOUNTER — Telehealth: Payer: Self-pay | Admitting: *Deleted

## 2012-10-28 MED ORDER — FLUCONAZOLE 150 MG PO TABS: 150.0000 mg | ORAL_TABLET | Freq: Once | ORAL | Status: DC

## 2012-10-28 NOTE — Telephone Encounter (Signed)
Rx for diflucan sent to patients pharmacy of choice for a yeast infection.

## 2012-10-28 NOTE — Telephone Encounter (Signed)
Patient needs rx for diflucan and she is currently in [place].  She will call me back with a pharmacy near to her and we will call this in for her.

## 2013-04-24 ENCOUNTER — Other Ambulatory Visit: Payer: Self-pay | Admitting: Obstetrics and Gynecology

## 2013-05-06 ENCOUNTER — Ambulatory Visit (INDEPENDENT_AMBULATORY_CARE_PROVIDER_SITE_OTHER): Payer: BC Managed Care – PPO | Admitting: Obstetrics & Gynecology

## 2013-05-06 ENCOUNTER — Encounter: Payer: Self-pay | Admitting: Obstetrics & Gynecology

## 2013-05-06 VITALS — BP 120/83 | HR 110 | Ht 65.0 in | Wt 193.0 lb

## 2013-05-06 DIAGNOSIS — G4700 Insomnia, unspecified: Secondary | ICD-10-CM

## 2013-05-06 DIAGNOSIS — N926 Irregular menstruation, unspecified: Secondary | ICD-10-CM

## 2013-05-06 MED ORDER — ZOLPIDEM TARTRATE 10 MG PO TABS: 10.0000 mg | ORAL_TABLET | Freq: Every evening | ORAL | Status: DC | PRN

## 2013-05-06 NOTE — Progress Notes (Signed)
   CLINIC ENCOUNTER NOTE  History:  38 y.o. G1P1001 here today for irregular menstrual bleeding.  She reports having three 'periods' last month, also was undergoing a lot of personal stress.  Negative home UPT.  No symptoms of anemia. Also desires refill of Ambien. No GYN problems.    The following portions of the patient's history were reviewed and updated as appropriate: allergies, current medications, past family history, past medical history, past social history, past surgical history and problem list. Last pap smear was 10/07/12 and was normal, negative HRHPV.  Review of Systems:  Pertinent items are noted in HPI.  Objective:  Physical Exam BP 120/83  Pulse 110  Ht 5\' 5"  (1.651 m)  Wt 193 lb (87.544 kg)  BMI 32.12 kg/m2  LMP 04/29/2013 Gen: NAD Abd: Soft, nontender and nondistended Pelvic: Normal appearing external genitalia; normal appearing vaginal mucosa and cervix.  Normal discharge.  Small uterus, no other palpable masses, no uterine or adnexal tenderness  Assessment & Plan:  Isolated month of irregular menstrual bleeding, no symptoms of anemia. Neg UPT. Likely secondary to stress. Will continue to observe for now. If recurs, will initiate evaluation with labs and ultrasound.  Bleeding precautions reviewed. Ambien refilled as per patient's request Routine preventative health maintenance measures emphasized.   Verita Schneiders, MD, [place] [facility]ending [place], [place]

## 2013-05-06 NOTE — Patient Instructions (Signed)
Preventive Care for Adults, Female A healthy lifestyle and preventive care can promote health and wellness. Preventive health guidelines for women include the following key practices.  A routine yearly physical is a good way to check with your health care provider about your health and preventive screening. It is a chance to share any concerns and updates on your health and to receive a thorough exam.  Visit your dentist for a routine exam and preventive care every 6 months. Brush your teeth twice a day and floss once a day. Good oral hygiene prevents tooth decay and gum disease.  The frequency of eye exams is based on your age, health, family medical history, use of contact lenses, and other factors. Follow your health care provider's recommendations for frequency of eye exams.  Eat a healthy diet. Foods like vegetables, fruits, whole grains, low-fat dairy products, and lean protein foods contain the nutrients you need without too many calories. Decrease your intake of foods high in solid fats, added sugars, and salt. Eat the right amount of calories for you.Get information about a proper diet from your health care provider, if necessary.  Regular physical exercise is one of the most important things you can do for your health. Most adults should get at least 150 minutes of moderate-intensity exercise (any activity that increases your heart rate and causes you to sweat) each week. In addition, most adults need muscle-strengthening exercises on 2 or more days a week.  Maintain a healthy weight. The body mass index (BMI) is a screening tool to identify possible weight problems. It provides an estimate of body fat based on height and weight. Your health care provider can find your BMI, and can help you achieve or maintain a healthy weight.For adults 20 years and older:  A BMI below 18.5 is considered underweight.  A BMI of 18.5 to 24.9 is normal.  A BMI of 25 to 29.9 is considered  overweight.  A BMI of 30 and above is considered obese.  Maintain normal blood lipids and cholesterol levels by exercising and minimizing your intake of saturated fat. Eat a balanced diet with plenty of fruit and vegetables. Blood tests for lipids and cholesterol should begin at age 20 and be repeated every 5 years. If your lipid or cholesterol levels are high, you are over 50, or you are at high risk for heart disease, you may need your cholesterol levels checked more frequently.Ongoing high lipid and cholesterol levels should be treated with medicines if diet and exercise are not working.  If you smoke, find out from your health care provider how to quit. If you do not use tobacco, do not start.  Lung cancer screening is recommended for adults aged 55 80 years who are at high risk for developing lung cancer because of a history of smoking. A yearly low-dose CT scan of the lungs is recommended for people who have at least a 30-pack-year history of smoking and are a current smoker or have quit within the past 15 years. A pack year of smoking is smoking an average of 1 pack of cigarettes a day for 1 year (for example: 1 pack a day for 30 years or 2 packs a day for 15 years). Yearly screening should continue until the smoker has stopped smoking for at least 15 years. Yearly screening should be stopped for people who develop a health problem that would prevent them from having lung cancer treatment.  If you are pregnant, do not drink alcohol. If you   are breastfeeding, be very cautious about drinking alcohol. If you are not pregnant and choose to drink alcohol, do not have more than 1 drink per day. One drink is considered to be 12 ounces (355 mL) of beer, 5 ounces (148 mL) of wine, or 1.5 ounces (44 mL) of liquor.  Avoid use of street drugs. Do not share needles with anyone. Ask for help if you need support or instructions about stopping the use of drugs.  High blood pressure causes heart disease and  increases the risk of stroke. Your blood pressure should be checked at least every 1 to 2 years. Ongoing high blood pressure should be treated with medicines if weight loss and exercise do not work.  If you are 20 40 years old, ask your health care provider if you should take aspirin to prevent strokes.  Diabetes screening involves taking a blood sample to check your fasting blood sugar level. This should be done once every 3 years, after age 35, if you are within normal weight and without risk factors for diabetes. Testing should be considered at a younger age or be carried out more frequently if you are overweight and have at least 1 risk factor for diabetes.  Breast cancer screening is essential preventive care for women. You should practice "breast self-awareness." This means understanding the normal appearance and feel of your breasts and may include breast self-examination. Any changes detected, no matter how small, should be reported to a health care provider. Women in their 42s and 30s should have a clinical breast exam (CBE) by a health care provider as part of a regular health exam every 1 to 3 years. After age 74, women should have a CBE every year. Starting at age 43, women should consider having a mammogram (breast X-ray test) every year. Women who have a family history of breast cancer should talk to their health care provider about genetic screening. Women at a high risk of breast cancer should talk to their health care providers about having an MRI and a mammogram every year.  Breast cancer gene (BRCA)-related cancer risk assessment is recommended for women who have family members with BRCA-related cancers. BRCA-related cancers include breast, ovarian, tubal, and peritoneal cancers. Having family members with these cancers may be associated with an increased risk for harmful changes (mutations) in the breast cancer genes BRCA1 and BRCA2. Results of the assessment will determine the need for  genetic counseling and BRCA1 and BRCA2 testing.  The Pap test is a screening test for cervical cancer. A Pap test can show cell changes on the cervix that might become cervical cancer if left untreated. A Pap test is a procedure in which cells are obtained and examined from the lower end of the uterus (cervix).  Women should have a Pap test starting at age 60.  Between ages 63 and 62, Pap tests should be repeated every 2 years.  Beginning at age 43, you should have a Pap test every 3 years as long as the past 3 Pap tests have been normal.  Some women have medical problems that increase the chance of getting cervical cancer. Talk to your health care provider about these problems. It is especially important to talk to your health care provider if a new problem develops soon after your last Pap test. In these cases, your health care provider may recommend more frequent screening and Pap tests.  The above recommendations are the same for women who have or have not gotten the vaccine  for human papillomavirus (HPV).  If you had a hysterectomy for a problem that was not cancer or a condition that could lead to cancer, then you no longer need Pap tests. Even if you no longer need a Pap test, a regular exam is a good idea to make sure no other problems are starting.  If you are between ages 65 and 70 years, and you have had normal Pap tests going back 10 years, you no longer need Pap tests. Even if you no longer need a Pap test, a regular exam is a good idea to make sure no other problems are starting.  If you have had past treatment for cervical cancer or a condition that could lead to cancer, you need Pap tests and screening for cancer for at least 20 years after your treatment.  If Pap tests have been discontinued, risk factors (such as a new sexual partner) need to be reassessed to determine if screening should be resumed.  The HPV test is an additional test that may be used for cervical cancer  screening. The HPV test looks for the virus that can cause the cell changes on the cervix. The cells collected during the Pap test can be tested for HPV. The HPV test could be used to screen women aged 30 years and older, and should be used in women of any age who have unclear Pap test results. After the age of 30, women should have HPV testing at the same frequency as a Pap test.  Colorectal cancer can be detected and often prevented. Most routine colorectal cancer screening begins at the age of 50 years and continues through age 75 years. However, your health care provider may recommend screening at an earlier age if you have risk factors for colon cancer. On a yearly basis, your health care provider may provide home test kits to check for hidden blood in the stool. Use of a small camera at the end of a tube, to directly examine the colon (sigmoidoscopy or colonoscopy), can detect the earliest forms of colorectal cancer. Talk to your health care provider about this at age 50, when routine screening begins. Direct exam of the colon should be repeated every 5 10 years through age 75 years, unless early forms of pre-cancerous polyps or small growths are found.  People who are at an increased risk for hepatitis B should be screened for this virus. You are considered at high risk for hepatitis B if:  You were born in a country where hepatitis B occurs often. Talk with your health care provider about which countries are considered high risk.  Your parents were born in a high-risk country and you have not received a shot to protect against hepatitis B (hepatitis B vaccine).  You have HIV or AIDS.  You use needles to inject street drugs.  You live with, or have sex with, someone who has Hepatitis B.  You get hemodialysis treatment.  You take certain medicines for conditions like cancer, organ transplantation, and autoimmune conditions.  Hepatitis C blood testing is recommended for all people born from  1945 through 1965 and any individual with known risks for hepatitis C.  Practice safe sex. Use condoms and avoid high-risk sexual practices to reduce the spread of sexually transmitted infections (STIs). STIs include gonorrhea, chlamydia, syphilis, trichomonas, herpes, HPV, and human immunodeficiency virus (HIV). Herpes, HIV, and HPV are viral illnesses that have no cure. They can result in disability, cancer, and death. Sexually active women aged 25   years and younger should be checked for chlamydia. Older women with new or multiple partners should also be tested for chlamydia. Testing for other STIs is recommended if you are sexually active and at increased risk.  Osteoporosis is a disease in which the bones lose minerals and strength with aging. This can result in serious bone fractures or breaks. The risk of osteoporosis can be identified using a bone density scan. Women ages 65 years and over and women at risk for fractures or osteoporosis should discuss screening with their health care providers. Ask your health care provider whether you should take a calcium supplement or vitamin D to reduce the rate of osteoporosis.  Menopause can be associated with physical symptoms and risks. Hormone replacement therapy is available to decrease symptoms and risks. You should talk to your health care provider about whether hormone replacement therapy is right for you.  Use sunscreen. Apply sunscreen liberally and repeatedly throughout the day. You should seek shade when your shadow is shorter than you. Protect yourself by wearing long sleeves, pants, a wide-brimmed hat, and sunglasses year round, whenever you are outdoors.  Once a month, do a whole body skin exam, using a mirror to look at the skin on your back. Tell your health care provider of new moles, moles that have irregular borders, moles that are larger than a pencil eraser, or moles that have changed in shape or color.  Stay current with required  vaccines (immunizations).  Influenza vaccine. All adults should be immunized every year.  Tetanus, diphtheria, and acellular pertussis (Td, Tdap) vaccine. Pregnant women should receive 1 dose of Tdap vaccine during each pregnancy. The dose should be obtained regardless of the length of time since the last dose. Immunization is preferred during the 27th 36th week of gestation. An adult who has not previously received Tdap or who does not know her vaccine status should receive 1 dose of Tdap. This initial dose should be followed by tetanus and diphtheria toxoids (Td) booster doses every 10 years. Adults with an unknown or incomplete history of completing a 3-dose immunization series with Td-containing vaccines should begin or complete a primary immunization series including a Tdap dose. Adults should receive a Td booster every 10 years.  Varicella vaccine. An adult without evidence of immunity to varicella should receive 2 doses or a second dose if she has previously received 1 dose. Pregnant females who do not have evidence of immunity should receive the first dose after pregnancy. This first dose should be obtained before leaving the health care facility. The second dose should be obtained 4 8 weeks after the first dose.  Human papillomavirus (HPV) vaccine. Females aged 13 26 years who have not received the vaccine previously should obtain the 3-dose series. The vaccine is not recommended for use in pregnant females. However, pregnancy testing is not needed before receiving a dose. If a female is found to be pregnant after receiving a dose, no treatment is needed. In that case, the remaining doses should be delayed until after the pregnancy. Immunization is recommended for any person with an immunocompromised condition through the age of 26 years if she did not get any or all doses earlier. During the 3-dose series, the second dose should be obtained 4 8 weeks after the first dose. The third dose should be  obtained 24 weeks after the first dose and 16 weeks after the second dose.  Zoster vaccine. One dose is recommended for adults aged 60 years or older unless certain   conditions are present.  Measles, mumps, and rubella (MMR) vaccine. Adults born before 1957 generally are considered immune to measles and mumps. Adults born in 1957 or later should have 1 or more doses of MMR vaccine unless there is a contraindication to the vaccine or there is laboratory evidence of immunity to each of the three diseases. A routine second dose of MMR vaccine should be obtained at least 28 days after the first dose for students attending postsecondary schools, health care workers, or international travelers. People who received inactivated measles vaccine or an unknown type of measles vaccine during 1963 1967 should receive 2 doses of MMR vaccine. People who received inactivated mumps vaccine or an unknown type of mumps vaccine before 1979 and are at high risk for mumps infection should consider immunization with 2 doses of MMR vaccine. For females of childbearing age, rubella immunity should be determined. If there is no evidence of immunity, females who are not pregnant should be vaccinated. If there is no evidence of immunity, females who are pregnant should delay immunization until after pregnancy. Unvaccinated health care workers born before 1957 who lack laboratory evidence of measles, mumps, or rubella immunity or laboratory confirmation of disease should consider measles and mumps immunization with 2 doses of MMR vaccine or rubella immunization with 1 dose of MMR vaccine.  Pneumococcal 13-valent conjugate (PCV13) vaccine. When indicated, a person who is uncertain of her immunization history and has no record of immunization should receive the PCV13 vaccine. An adult aged 19 years or older who has certain medical conditions and has not been previously immunized should receive 1 dose of PCV13 vaccine. This PCV13 should be  followed with a dose of pneumococcal polysaccharide (PPSV23) vaccine. The PPSV23 vaccine dose should be obtained at least 8 weeks after the dose of PCV13 vaccine. An adult aged 19 years or older who has certain medical conditions and previously received 1 or more doses of PPSV23 vaccine should receive 1 dose of PCV13. The PCV13 vaccine dose should be obtained 1 or more years after the last PPSV23 vaccine dose.  Pneumococcal polysaccharide (PPSV23) vaccine. When PCV13 is also indicated, PCV13 should be obtained first. All adults aged 65 years and older should be immunized. An adult younger than age 65 years who has certain medical conditions should be immunized. Any person who resides in a nursing home or long-term care facility should be immunized. An adult smoker should be immunized. People with an immunocompromised condition and certain other conditions should receive both PCV13 and PPSV23 vaccines. People with human immunodeficiency virus (HIV) infection should be immunized as soon as possible after diagnosis. Immunization during chemotherapy or radiation therapy should be avoided. Routine use of PPSV23 vaccine is not recommended for American Indians, [place] Natives, or people younger than 65 years unless there are medical conditions that require PPSV23 vaccine. When indicated, people who have unknown immunization and have no record of immunization should receive PPSV23 vaccine. One-time revaccination 5 years after the first dose of PPSV23 is recommended for people aged 19 64 years who have chronic kidney failure, nephrotic syndrome, asplenia, or immunocompromised conditions. People who received 1 2 doses of PPSV23 before age 65 years should receive another dose of PPSV23 vaccine at age 65 years or later if at least 5 years have passed since the previous dose. Doses of PPSV23 are not needed for people immunized with PPSV23 at or after age 65 years.  Meningococcal vaccine. Adults with asplenia or persistent  complement component deficiencies should receive 2   doses of quadrivalent meningococcal conjugate (MenACWY-D) vaccine. The doses should be obtained at least 2 months apart. Microbiologists working with certain meningococcal bacteria, military recruits, people at risk during an outbreak, and people who travel to or live in countries with a high rate of meningitis should be immunized. A first-year college student up through age 21 years who is living in a residence hall should receive a dose if she did not receive a dose on or after her 16th birthday. Adults who have certain high-risk conditions should receive one or more doses of vaccine.  Hepatitis A vaccine. Adults who wish to be protected from this disease, have certain high-risk conditions, work with hepatitis A-infected animals, work in hepatitis A research labs, or travel to or work in countries with a high rate of hepatitis A should be immunized. Adults who were previously unvaccinated and who anticipate close contact with an international adoptee during the first 60 days after arrival in the [place] from a country with a high rate of hepatitis A should be immunized.  Hepatitis B vaccine. Adults who wish to be protected from this disease, have certain high-risk conditions, may be exposed to blood or other infectious body fluids, are household contacts or sex partners of hepatitis B positive people, are clients or workers in certain care facilities, or travel to or work in countries with a high rate of hepatitis B should be immunized.  Haemophilus influenzae type b (Hib) vaccine. A previously unvaccinated person with asplenia or sickle cell disease or having a scheduled splenectomy should receive 1 dose of Hib vaccine. Regardless of previous immunization, a recipient of a hematopoietic stem cell transplant should receive a 3-dose series 6 12 months after her successful transplant. Hib vaccine is not recommended for adults with HIV  infection. Preventive Services / Frequency Ages 19 to 39years  Blood pressure check.** / Every 1 to 2 years.  Lipid and cholesterol check.** / Every 5 years beginning at age 20.  Clinical breast exam.** / Every 3 years for women in their 20s and 30s.  BRCA-related cancer risk assessment.** / For women who have family members with a BRCA-related cancer (breast, ovarian, tubal, or peritoneal cancers).  Pap test.** / Every 2 years from ages 21 through 29. Every 3 years starting at age 30 through age 65 or 70 with a history of 3 consecutive normal Pap tests.  HPV screening.** / Every 3 years from ages 30 through ages 65 to 70 with a history of 3 consecutive normal Pap tests.  Hepatitis C blood test.** / For any individual with known risks for hepatitis C.  Skin self-exam. / Monthly.  Influenza vaccine. / Every year.  Tetanus, diphtheria, and acellular pertussis (Tdap, Td) vaccine.** / Consult your health care provider. Pregnant women should receive 1 dose of Tdap vaccine during each pregnancy. 1 dose of Td every 10 years.  Varicella vaccine.** / Consult your health care provider. Pregnant females who do not have evidence of immunity should receive the first dose after pregnancy.  HPV vaccine. / 3 doses over 6 months, if 26 and younger. The vaccine is not recommended for use in pregnant females. However, pregnancy testing is not needed before receiving a dose.  Measles, mumps, rubella (MMR) vaccine.** / You need at least 1 dose of MMR if you were born in 1957 or later. You may also need a 2nd dose. For females of childbearing age, rubella immunity should be determined. If there is no evidence of immunity, females who are not   pregnant should be vaccinated. If there is no evidence of immunity, females who are pregnant should delay immunization until after pregnancy.  Pneumococcal 13-valent conjugate (PCV13) vaccine.** / Consult your health care provider.  Pneumococcal polysaccharide (PPSV23)  vaccine.** / 1 to 2 doses if you smoke cigarettes or if you have certain conditions.  Meningococcal vaccine.** / 1 dose if you are age 88 to 41 years and a Market researcher living in a residence hall, or have one of several medical conditions, you need to get vaccinated against meningococcal disease. You may also need additional booster doses.  Hepatitis A vaccine.** / Consult your health care provider.  Hepatitis B vaccine.** / Consult your health care provider.  Haemophilus influenzae type b (Hib) vaccine.** / Consult your health care provider. Ages 45 to 64years  Blood pressure check.** / Every 1 to 2 years.  Lipid and cholesterol check.** / Every 5 years beginning at age 2 years.  Lung cancer screening. / Every year if you are aged 10 80 years and have a 30-pack-year history of smoking and currently smoke or have quit within the past 15 years. Yearly screening is stopped once you have quit smoking for at least 15 years or develop a health problem that would prevent you from having lung cancer treatment.  Clinical breast exam.** / Every year after age 28 years.  BRCA-related cancer risk assessment.** / For women who have family members with a BRCA-related cancer (breast, ovarian, tubal, or peritoneal cancers).  Mammogram.** / Every year beginning at age 63 years and continuing for as long as you are in good health. Consult with your health care provider.  Pap test.** / Every 3 years starting at age 71 years through age 39 or 90 years with a history of 3 consecutive normal Pap tests.  HPV screening.** / Every 3 years from ages 27 years through ages 32 to 72 years with a history of 3 consecutive normal Pap tests.  Fecal occult blood test (FOBT) of stool. / Every year beginning at age 40 years and continuing until age 29 years. You may not need to do this test if you get a colonoscopy every 10 years.  Flexible sigmoidoscopy or colonoscopy.** / Every 5 years for a flexible  sigmoidoscopy or every 10 years for a colonoscopy beginning at age 66 years and continuing until age 47 years.  Hepatitis C blood test.** / For all people born from [birth year] through 1967 and any individual with known risks for hepatitis C.  Skin self-exam. / Monthly.  Influenza vaccine. / Every year.  Tetanus, diphtheria, and acellular pertussis (Tdap/Td) vaccine.** / Consult your health care provider. Pregnant women should receive 1 dose of Tdap vaccine during each pregnancy. 1 dose of Td every 10 years.  Varicella vaccine.** / Consult your health care provider. Pregnant females who do not have evidence of immunity should receive the first dose after pregnancy.  Zoster vaccine.** / 1 dose for adults aged 39 years or older.  Measles, mumps, rubella (MMR) vaccine.** / You need at least 1 dose of MMR if you were born in 1959 or later. You may also need a 2nd dose. For females of childbearing age, rubella immunity should be determined. If there is no evidence of immunity, females who are not pregnant should be vaccinated. If there is no evidence of immunity, females who are pregnant should delay immunization until after pregnancy.  Pneumococcal 13-valent conjugate (PCV13) vaccine.** / Consult your health care provider.  Pneumococcal polysaccharide (PPSV23) vaccine.** / 1  to 2 doses if you smoke cigarettes or if you have certain conditions.  Meningococcal vaccine.** / Consult your health care provider.  Hepatitis A vaccine.** / Consult your health care provider.  Hepatitis B vaccine.** / Consult your health care provider.  Haemophilus influenzae type b (Hib) vaccine.** / Consult your health care provider. Ages 66 years and over  Blood pressure check.** / Every 1 to 2 years.  Lipid and cholesterol check.** / Every 5 years beginning at age 50 years.  Lung cancer screening. / Every year if you are aged 65 80 years and have a 30-pack-year history of smoking and currently smoke or have quit  within the past 15 years. Yearly screening is stopped once you have quit smoking for at least 15 years or develop a health problem that would prevent you from having lung cancer treatment.  Clinical breast exam.** / Every year after age 71 years.  BRCA-related cancer risk assessment.** / For women who have family members with a BRCA-related cancer (breast, ovarian, tubal, or peritoneal cancers).  Mammogram.** / Every year beginning at age 58 years and continuing for as long as you are in good health. Consult with your health care provider.  Pap test.** / Every 3 years starting at age 51 years through age 34 or 38 years with 3 consecutive normal Pap tests. Testing can be stopped between 65 and 70 years with 3 consecutive normal Pap tests and no abnormal Pap or HPV tests in the past 10 years.  HPV screening.** / Every 3 years from ages 35 years through ages 84 or 80 years with a history of 3 consecutive normal Pap tests. Testing can be stopped between 65 and 70 years with 3 consecutive normal Pap tests and no abnormal Pap or HPV tests in the past 10 years.  Fecal occult blood test (FOBT) of stool. / Every year beginning at age 26 years and continuing until age 51 years. You may not need to do this test if you get a colonoscopy every 10 years.  Flexible sigmoidoscopy or colonoscopy.** / Every 5 years for a flexible sigmoidoscopy or every 10 years for a colonoscopy beginning at age 55 years and continuing until age 19 years.  Hepatitis C blood test.** / For all people born from [birth year] through 1967 and any individual with known risks for hepatitis C.  Osteoporosis screening.** / A one-time screening for women ages 68 years and over and women at risk for fractures or osteoporosis.  Skin self-exam. / Monthly.  Influenza vaccine. / Every year.  Tetanus, diphtheria, and acellular pertussis (Tdap/Td) vaccine.** / 1 dose of Td every 10 years.  Varicella vaccine.** / Consult your health care  provider.  Zoster vaccine.** / 1 dose for adults aged 21 years or older.  Pneumococcal 13-valent conjugate (PCV13) vaccine.** / Consult your health care provider.  Pneumococcal polysaccharide (PPSV23) vaccine.** / 1 dose for all adults aged 43 years and older.  Meningococcal vaccine.** / Consult your health care provider.  Hepatitis A vaccine.** / Consult your health care provider.  Hepatitis B vaccine.** / Consult your health care provider.  Haemophilus influenzae type b (Hib) vaccine.** / Consult your health care provider. ** Family history and personal history of risk and conditions may change your health care provider's recommendations. Document Released: 04/16/2001 Document Revised: 12/09/2012 Document Reviewed: 07/16/2010 [facility] Patient Information 2014 [place], [place].  Thank you for enrolling in [facility]. Please follow the instructions below to securely access your online medical record. MyChart allows you to send messages  to your doctor, view your test results, manage appointments, and more.   How Do I Sign Up? 1. In your Internet browser, go to [employer] and enter [email or web address]. 2. Click on the Sign Up Now link in the Sign In box. You will see the New Member Sign Up page. 3. Enter your MyChart Access Code exactly as it appears below. You will not need to use this code after you've completed the sign-up process. If you do not sign up before the expiration date, you must request a new code.  MyChart Access Code: [email or web address] Expires: 07/05/2013  9:15 AM  4. Enter your Social Security Number ([id number]) and Date of Birth (mm/dd/yyyy) as indicated and click Submit. You will be taken to the next sign-up page. 5. Create a MyChart ID. This will be your MyChart login ID and cannot be changed, so think of one that is secure and easy to remember. 6. Create a MyChart password. You can change your password at any time. 7. Enter your Password Reset Question  and Answer. This can be used at a later time if you forget your password.  8. Enter your e-mail address. You will receive e-mail notification when new information is available in [facility]. 9. Click Sign Up. You can now view your medical record.   Additional Information Remember, MyChart is NOT to be used for urgent needs. For medical emergencies, dial 911.

## 2013-06-10 DIAGNOSIS — J309 Allergic rhinitis, unspecified: Secondary | ICD-10-CM | POA: Insufficient documentation

## 2013-06-23 DIAGNOSIS — L209 Atopic dermatitis, unspecified: Secondary | ICD-10-CM | POA: Insufficient documentation

## 2013-06-23 HISTORY — DX: Atopic dermatitis, unspecified: L20.9

## 2013-10-06 ENCOUNTER — Ambulatory Visit (INDEPENDENT_AMBULATORY_CARE_PROVIDER_SITE_OTHER): Payer: BC Managed Care – PPO | Admitting: Obstetrics and Gynecology

## 2013-10-06 ENCOUNTER — Encounter: Payer: Self-pay | Admitting: Obstetrics and Gynecology

## 2013-10-06 VITALS — BP 115/81 | HR 95 | Ht 65.5 in | Wt 184.0 lb

## 2013-10-06 DIAGNOSIS — Z1151 Encounter for screening for human papillomavirus (HPV): Secondary | ICD-10-CM

## 2013-10-06 DIAGNOSIS — G4700 Insomnia, unspecified: Secondary | ICD-10-CM

## 2013-10-06 DIAGNOSIS — Z01419 Encounter for gynecological examination (general) (routine) without abnormal findings: Secondary | ICD-10-CM

## 2013-10-06 DIAGNOSIS — N899 Noninflammatory disorder of vagina, unspecified: Secondary | ICD-10-CM

## 2013-10-06 DIAGNOSIS — L709 Acne, unspecified: Secondary | ICD-10-CM | POA: Insufficient documentation

## 2013-10-06 DIAGNOSIS — Z124 Encounter for screening for malignant neoplasm of cervix: Secondary | ICD-10-CM

## 2013-10-06 MED ORDER — ZOLPIDEM TARTRATE 10 MG PO TABS: 10.0000 mg | ORAL_TABLET | Freq: Every evening | ORAL | Status: DC | PRN

## 2013-10-06 NOTE — Progress Notes (Signed)
  Subjective:     [name] is a 38 y.o. female with LMP 09/29/2013 and BMI 30and is here for a comprehensive physical exam. The patient reports some vaginal irritation which started around the time of her last menses.  It is accompanied by pruritis but no abnormal discharge. Patient is sexually active and not interested in contraception  History   Social History  . Marital Status: Married    Spouse Name: N/A    Number of Children: N/A  . Years of Education: N/A   Occupational History  . Not on file.   Social History Main Topics  . Smoking status: Never Smoker   . Smokeless tobacco: Never Used  . Alcohol Use: Yes     Comment: occassionally  . Drug Use: No  . Sexual Activity: Yes    Partners: Male   Other Topics Concern  . Not on file   Social History Narrative  . No narrative on file   Health Maintenance  Topic Date Due  . Tetanus/tdap  08/01/1992  . Influenza Vaccine  10/02/2013  . Pap Smear  10/08/2015       Review of Systems A comprehensive review of systems was negative.   Objective:      GENERAL: Well-developed, well-nourished female in no acute distress.  HEENT: Normocephalic, atraumatic. Sclerae anicteric.  NECK: Supple. Normal thyroid.  LUNGS: Clear to auscultation bilaterally.  HEART: Regular rate and rhythm. BREASTS: Symmetric in size. No palpable masses or lymphadenopathy, skin changes, or nipple drainage. ABDOMEN: Soft, nontender, nondistended. No organomegaly. PELVIC: Normal external female genitalia. Vagina is pink and rugated.  Normal discharge. Normal appearing cervix. Uterus is normal in size. No adnexal mass or tenderness. EXTREMITIES: No cyanosis, clubbing, or edema, 2+ distal pulses.    Assessment:    Healthy female exam.      Plan:    Pap smear collected Wet prep collected Screening mammogram ordered Patient advised to perform monthly self breast and vulva exams Patient will be contacted with any abnormal results RTC in a  year or prn See After Visit Summary for Counseling Recommendations

## 2013-10-06 NOTE — Progress Notes (Signed)
Right before during and after her cycle her vaginal discharge is different and causes her to be very sensitive in that area.  It does not have an odor.  It has been happening since beginning of last week.  It has happened before but not every time.

## 2013-10-06 NOTE — Patient Instructions (Signed)
Preventive Care for Adults A healthy lifestyle and preventive care can promote health and wellness. Preventive health guidelines for women include the following key practices.  A routine yearly physical is a good way to check with your health care provider about your health and preventive screening. It is a chance to share any concerns and updates on your health and to receive a thorough exam.  Visit your dentist for a routine exam and preventive care every 6 months. Brush your teeth twice a day and floss once a day. Good oral hygiene prevents tooth decay and gum disease.  The frequency of eye exams is based on your age, health, family medical history, use of contact lenses, and other factors. Follow your health care provider's recommendations for frequency of eye exams.  Eat a healthy diet. Foods like vegetables, fruits, whole grains, low-fat dairy products, and lean protein foods contain the nutrients you need without too many calories. Decrease your intake of foods high in solid fats, added sugars, and salt. Eat the right amount of calories for you.Get information about a proper diet from your health care provider, if necessary.  Regular physical exercise is one of the most important things you can do for your health. Most adults should get at least 150 minutes of moderate-intensity exercise (any activity that increases your heart rate and causes you to sweat) each week. In addition, most adults need muscle-strengthening exercises on 2 or more days a week.  Maintain a healthy weight. The body mass index (BMI) is a screening tool to identify possible weight problems. It provides an estimate of body fat based on height and weight. Your health care provider can find your BMI and can help you achieve or maintain a healthy weight.For adults 20 years and older:  A BMI below 18.5 is considered underweight.  A BMI of 18.5 to 24.9 is normal.  A BMI of 25 to 29.9 is considered overweight.  A BMI of  30 and above is considered obese.  Maintain normal blood lipids and cholesterol levels by exercising and minimizing your intake of saturated fat. Eat a balanced diet with plenty of fruit and vegetables. Blood tests for lipids and cholesterol should begin at age 20 and be repeated every 5 years. If your lipid or cholesterol levels are high, you are over 50, or you are at high risk for heart disease, you may need your cholesterol levels checked more frequently.Ongoing high lipid and cholesterol levels should be treated with medicines if diet and exercise are not working.  If you smoke, find out from your health care provider how to quit. If you do not use tobacco, do not start.  Lung cancer screening is recommended for adults aged 55-80 years who are at high risk for developing lung cancer because of a history of smoking. A yearly low-dose CT scan of the lungs is recommended for people who have at least a 30-pack-year history of smoking and are a current smoker or have quit within the past 15 years. A pack year of smoking is smoking an average of 1 pack of cigarettes a day for 1 year (for example: 1 pack a day for 30 years or 2 packs a day for 15 years). Yearly screening should continue until the smoker has stopped smoking for at least 15 years. Yearly screening should be stopped for people who develop a health problem that would prevent them from having lung cancer treatment.  If you are pregnant, do not drink alcohol. If you are breastfeeding,   be very cautious about drinking alcohol. If you are not pregnant and choose to drink alcohol, do not have more than 1 drink per day. One drink is considered to be 12 ounces (355 mL) of beer, 5 ounces (148 mL) of wine, or 1.5 ounces (44 mL) of liquor.  Avoid use of street drugs. Do not share needles with anyone. Ask for help if you need support or instructions about stopping the use of drugs.  High blood pressure causes heart disease and increases the risk of  stroke. Your blood pressure should be checked at least every 1 to 2 years. Ongoing high blood pressure should be treated with medicines if weight loss and exercise do not work.  If you are 3-86 years old, ask your health care provider if you should take aspirin to prevent strokes.  Diabetes screening involves taking a blood sample to check your fasting blood sugar level. This should be done once every 3 years, after age 67, if you are within normal weight and without risk factors for diabetes. Testing should be considered at a younger age or be carried out more frequently if you are overweight and have at least 1 risk factor for diabetes.  Breast cancer screening is essential preventive care for women. You should practice "breast self-awareness." This means understanding the normal appearance and feel of your breasts and may include breast self-examination. Any changes detected, no matter how small, should be reported to a health care provider. Women in their 8s and 30s should have a clinical breast exam (CBE) by a health care provider as part of a regular health exam every 1 to 3 years. After age 70, women should have a CBE every year. Starting at age 25, women should consider having a mammogram (breast X-ray test) every year. Women who have a family history of breast cancer should talk to their health care provider about genetic screening. Women at a high risk of breast cancer should talk to their health care providers about having an MRI and a mammogram every year.  Breast cancer gene (BRCA)-related cancer risk assessment is recommended for women who have family members with BRCA-related cancers. BRCA-related cancers include breast, ovarian, tubal, and peritoneal cancers. Having family members with these cancers may be associated with an increased risk for harmful changes (mutations) in the breast cancer genes BRCA1 and BRCA2. Results of the assessment will determine the need for genetic counseling and  BRCA1 and BRCA2 testing.  Routine pelvic exams to screen for cancer are no longer recommended for nonpregnant women who are considered low risk for cancer of the pelvic organs (ovaries, uterus, and vagina) and who do not have symptoms. Ask your health care provider if a screening pelvic exam is right for you.  If you have had past treatment for cervical cancer or a condition that could lead to cancer, you need Pap tests and screening for cancer for at least 20 years after your treatment. If Pap tests have been discontinued, your risk factors (such as having a new sexual partner) need to be reassessed to determine if screening should be resumed. Some women have medical problems that increase the chance of getting cervical cancer. In these cases, your health care provider may recommend more frequent screening and Pap tests.  The HPV test is an additional test that may be used for cervical cancer screening. The HPV test looks for the virus that can cause the cell changes on the cervix. The cells collected during the Pap test can be  tested for HPV. The HPV test could be used to screen women aged 58 years and older, and should be used in women of any age who have unclear Pap test results. After the age of 53, women should have HPV testing at the same frequency as a Pap test.  Colorectal cancer can be detected and often prevented. Most routine colorectal cancer screening begins at the age of 67 years and continues through age 83 years. However, your health care provider may recommend screening at an earlier age if you have risk factors for colon cancer. On a yearly basis, your health care provider may provide home test kits to check for hidden blood in the stool. Use of a small camera at the end of a tube, to directly examine the colon (sigmoidoscopy or colonoscopy), can detect the earliest forms of colorectal cancer. Talk to your health care provider about this at age 38, when routine screening begins. Direct  exam of the colon should be repeated every 5-10 years through age 88 years, unless early forms of pre-cancerous polyps or small growths are found.  People who are at an increased risk for hepatitis B should be screened for this virus. You are considered at high risk for hepatitis B if:  You were born in a country where hepatitis B occurs often. Talk with your health care provider about which countries are considered high risk.  Your parents were born in a high-risk country and you have not received a shot to protect against hepatitis B (hepatitis B vaccine).  You have HIV or AIDS.  You use needles to inject street drugs.  You live with, or have sex with, someone who has hepatitis B.  You get hemodialysis treatment.  You take certain medicines for conditions like cancer, organ transplantation, and autoimmune conditions.  Hepatitis C blood testing is recommended for all people born from [birth year] through 1965 and any individual with known risks for hepatitis C.  Practice safe sex. Use condoms and avoid high-risk sexual practices to reduce the spread of sexually transmitted infections (STIs). STIs include gonorrhea, chlamydia, syphilis, trichomonas, herpes, HPV, and human immunodeficiency virus (HIV). Herpes, HIV, and HPV are viral illnesses that have no cure. They can result in disability, cancer, and death.  You should be screened for sexually transmitted illnesses (STIs) including gonorrhea and chlamydia if:  You are sexually active and are younger than 24 years.  You are older than 24 years and your health care provider tells you that you are at risk for this type of infection.  Your sexual activity has changed since you were last screened and you are at an increased risk for chlamydia or gonorrhea. Ask your health care provider if you are at risk.  If you are at risk of being infected with HIV, it is recommended that you take a prescription medicine daily to prevent HIV infection. This is  called preexposure prophylaxis (PrEP). You are considered at risk if:  You are a heterosexual woman, are sexually active, and are at increased risk for HIV infection.  You take drugs by injection.  You are sexually active with a partner who has HIV.  Talk with your health care provider about whether you are at high risk of being infected with HIV. If you choose to begin PrEP, you should first be tested for HIV. You should then be tested every 3 months for as long as you are taking PrEP.  Osteoporosis is a disease in which the bones lose minerals and strength  with aging. This can result in serious bone fractures or breaks. The risk of osteoporosis can be identified using a bone density scan. Women ages 27 years and over and women at risk for fractures or osteoporosis should discuss screening with their health care providers. Ask your health care provider whether you should take a calcium supplement or vitamin D to reduce the rate of osteoporosis.  Menopause can be associated with physical symptoms and risks. Hormone replacement therapy is available to decrease symptoms and risks. You should talk to your health care provider about whether hormone replacement therapy is right for you.  Use sunscreen. Apply sunscreen liberally and repeatedly throughout the day. You should seek shade when your shadow is shorter than you. Protect yourself by wearing long sleeves, pants, a wide-brimmed hat, and sunglasses year round, whenever you are outdoors.  Once a month, do a whole body skin exam, using a mirror to look at the skin on your back. Tell your health care provider of new moles, moles that have irregular borders, moles that are larger than a pencil eraser, or moles that have changed in shape or color.  Stay current with required vaccines (immunizations).  Influenza vaccine. All adults should be immunized every year.  Tetanus, diphtheria, and acellular pertussis (Td, Tdap) vaccine. Pregnant women should  receive 1 dose of Tdap vaccine during each pregnancy. The dose should be obtained regardless of the length of time since the last dose. Immunization is preferred during the 27th-36th week of gestation. An adult who has not previously received Tdap or who does not know her vaccine status should receive 1 dose of Tdap. This initial dose should be followed by tetanus and diphtheria toxoids (Td) booster doses every 10 years. Adults with an unknown or incomplete history of completing a 3-dose immunization series with Td-containing vaccines should begin or complete a primary immunization series including a Tdap dose. Adults should receive a Td booster every 10 years.  Varicella vaccine. An adult without evidence of immunity to varicella should receive 2 doses or a second dose if she has previously received 1 dose. Pregnant females who do not have evidence of immunity should receive the first dose after pregnancy. This first dose should be obtained before leaving the health care facility. The second dose should be obtained 4-8 weeks after the first dose.  Human papillomavirus (HPV) vaccine. Females aged 13-26 years who have not received the vaccine previously should obtain the 3-dose series. The vaccine is not recommended for use in pregnant females. However, pregnancy testing is not needed before receiving a dose. If a female is found to be pregnant after receiving a dose, no treatment is needed. In that case, the remaining doses should be delayed until after the pregnancy. Immunization is recommended for any person with an immunocompromised condition through the age of 68 years if she did not get any or all doses earlier. During the 3-dose series, the second dose should be obtained 4-8 weeks after the first dose. The third dose should be obtained 24 weeks after the first dose and 16 weeks after the second dose.  Zoster vaccine. One dose is recommended for adults aged 75 years or older unless certain conditions are  present.  Measles, mumps, and rubella (MMR) vaccine. Adults born before [birth year] generally are considered immune to measles and mumps. Adults born in [birth year] or later should have 1 or more doses of MMR vaccine unless there is a contraindication to the vaccine or there is laboratory evidence of immunity to  each of the three diseases. A routine second dose of MMR vaccine should be obtained at least 28 days after the first dose for students attending postsecondary schools, health care workers, or international travelers. People who received inactivated measles vaccine or an unknown type of measles vaccine during 1963-1967 should receive 2 doses of MMR vaccine. People who received inactivated mumps vaccine or an unknown type of mumps vaccine before 1979 and are at high risk for mumps infection should consider immunization with 2 doses of MMR vaccine. For females of childbearing age, rubella immunity should be determined. If there is no evidence of immunity, females who are not pregnant should be vaccinated. If there is no evidence of immunity, females who are pregnant should delay immunization until after pregnancy. Unvaccinated health care workers born before 1957 who lack laboratory evidence of measles, mumps, or rubella immunity or laboratory confirmation of disease should consider measles and mumps immunization with 2 doses of MMR vaccine or rubella immunization with 1 dose of MMR vaccine.  Pneumococcal 13-valent conjugate (PCV13) vaccine. When indicated, a person who is uncertain of her immunization history and has no record of immunization should receive the PCV13 vaccine. An adult aged 19 years or older who has certain medical conditions and has not been previously immunized should receive 1 dose of PCV13 vaccine. This PCV13 should be followed with a dose of pneumococcal polysaccharide (PPSV23) vaccine. The PPSV23 vaccine dose should be obtained at least 8 weeks after the dose of PCV13 vaccine. An adult aged 19  years or older who has certain medical conditions and previously received 1 or more doses of PPSV23 vaccine should receive 1 dose of PCV13. The PCV13 vaccine dose should be obtained 1 or more years after the last PPSV23 vaccine dose.  Pneumococcal polysaccharide (PPSV23) vaccine. When PCV13 is also indicated, PCV13 should be obtained first. All adults aged 65 years and older should be immunized. An adult younger than age 65 years who has certain medical conditions should be immunized. Any person who resides in a nursing home or long-term care facility should be immunized. An adult smoker should be immunized. People with an immunocompromised condition and certain other conditions should receive both PCV13 and PPSV23 vaccines. People with human immunodeficiency virus (HIV) infection should be immunized as soon as possible after diagnosis. Immunization during chemotherapy or radiation therapy should be avoided. Routine use of PPSV23 vaccine is not recommended for American Indians, [place] Natives, or people younger than 65 years unless there are medical conditions that require PPSV23 vaccine. When indicated, people who have unknown immunization and have no record of immunization should receive PPSV23 vaccine. One-time revaccination 5 years after the first dose of PPSV23 is recommended for people aged 19-64 years who have chronic kidney failure, nephrotic syndrome, asplenia, or immunocompromised conditions. People who received 1-2 doses of PPSV23 before age 65 years should receive another dose of PPSV23 vaccine at age 65 years or later if at least 5 years have passed since the previous dose. Doses of PPSV23 are not needed for people immunized with PPSV23 at or after age 65 years.  Meningococcal vaccine. Adults with asplenia or persistent complement component deficiencies should receive 2 doses of quadrivalent meningococcal conjugate (MenACWY-D) vaccine. The doses should be obtained at least 2 months apart.  Microbiologists working with certain meningococcal bacteria, military recruits, people at risk during an outbreak, and people who travel to or live in countries with a high rate of meningitis should be immunized. A first-year college student up through age   21 years who is living in a residence hall should receive a dose if she did not receive a dose on or after her 16th birthday. Adults who have certain high-risk conditions should receive one or more doses of vaccine.  Hepatitis A vaccine. Adults who wish to be protected from this disease, have certain high-risk conditions, work with hepatitis A-infected animals, work in hepatitis A research labs, or travel to or work in countries with a high rate of hepatitis A should be immunized. Adults who were previously unvaccinated and who anticipate close contact with an international adoptee during the first 60 days after arrival in the [place] from a country with a high rate of hepatitis A should be immunized.  Hepatitis B vaccine. Adults who wish to be protected from this disease, have certain high-risk conditions, may be exposed to blood or other infectious body fluids, are household contacts or sex partners of hepatitis B positive people, are clients or workers in certain care facilities, or travel to or work in countries with a high rate of hepatitis B should be immunized.  Haemophilus influenzae type b (Hib) vaccine. A previously unvaccinated person with asplenia or sickle cell disease or having a scheduled splenectomy should receive 1 dose of Hib vaccine. Regardless of previous immunization, a recipient of a hematopoietic stem cell transplant should receive a 3-dose series 6-12 months after her successful transplant. Hib vaccine is not recommended for adults with HIV infection. Preventive Services / Frequency Ages 57 to 1 years  Blood pressure check.** / Every 1 to 2 years.  Lipid and cholesterol check.** / Every 5 years beginning at age  71.  Clinical breast exam.** / Every 3 years for women in their 1s and 48s.  BRCA-related cancer risk assessment.** / For women who have family members with a BRCA-related cancer (breast, ovarian, tubal, or peritoneal cancers).  Pap test.** / Every 2 years from ages 8 through 51. Every 3 years starting at age 40 through age 67 or 55 with a history of 3 consecutive normal Pap tests.  HPV screening.** / Every 3 years from ages 66 through ages 40 to 59 with a history of 3 consecutive normal Pap tests.  Hepatitis C blood test.** / For any individual with known risks for hepatitis C.  Skin self-exam. / Monthly.  Influenza vaccine. / Every year.  Tetanus, diphtheria, and acellular pertussis (Tdap, Td) vaccine.** / Consult your health care provider. Pregnant women should receive 1 dose of Tdap vaccine during each pregnancy. 1 dose of Td every 10 years.  Varicella vaccine.** / Consult your health care provider. Pregnant females who do not have evidence of immunity should receive the first dose after pregnancy.  HPV vaccine. / 3 doses over 6 months, if 73 and younger. The vaccine is not recommended for use in pregnant females. However, pregnancy testing is not needed before receiving a dose.  Measles, mumps, rubella (MMR) vaccine.** / You need at least 1 dose of MMR if you were born in 1957 or later. You may also need a 2nd dose. For females of childbearing age, rubella immunity should be determined. If there is no evidence of immunity, females who are not pregnant should be vaccinated. If there is no evidence of immunity, females who are pregnant should delay immunization until after pregnancy.  Pneumococcal 13-valent conjugate (PCV13) vaccine.** / Consult your health care provider.  Pneumococcal polysaccharide (PPSV23) vaccine.** / 1 to 2 doses if you smoke cigarettes or if you have certain conditions.  Meningococcal vaccine.** /  1 dose if you are age 19 to 21 years and a first-year college  student living in a residence hall, or have one of several medical conditions, you need to get vaccinated against meningococcal disease. You may also need additional booster doses.  Hepatitis A vaccine.** / Consult your health care provider.  Hepatitis B vaccine.** / Consult your health care provider.  Haemophilus influenzae type b (Hib) vaccine.** / Consult your health care provider. Ages 40 to 64 years  Blood pressure check.** / Every 1 to 2 years.  Lipid and cholesterol check.** / Every 5 years beginning at age 20 years.  Lung cancer screening. / Every year if you are aged 55-80 years and have a 30-pack-year history of smoking and currently smoke or have quit within the past 15 years. Yearly screening is stopped once you have quit smoking for at least 15 years or develop a health problem that would prevent you from having lung cancer treatment.  Clinical breast exam.** / Every year after age 40 years.  BRCA-related cancer risk assessment.** / For women who have family members with a BRCA-related cancer (breast, ovarian, tubal, or peritoneal cancers).  Mammogram.** / Every year beginning at age 40 years and continuing for as long as you are in good health. Consult with your health care provider.  Pap test.** / Every 3 years starting at age 30 years through age 65 or 70 years with a history of 3 consecutive normal Pap tests.  HPV screening.** / Every 3 years from ages 30 years through ages 65 to 70 years with a history of 3 consecutive normal Pap tests.  Fecal occult blood test (FOBT) of stool. / Every year beginning at age 50 years and continuing until age 75 years. You may not need to do this test if you get a colonoscopy every 10 years.  Flexible sigmoidoscopy or colonoscopy.** / Every 5 years for a flexible sigmoidoscopy or every 10 years for a colonoscopy beginning at age 50 years and continuing until age 75 years.  Hepatitis C blood test.** / For all people born from 1945 through  1965 and any individual with known risks for hepatitis C.  Skin self-exam. / Monthly.  Influenza vaccine. / Every year.  Tetanus, diphtheria, and acellular pertussis (Tdap/Td) vaccine.** / Consult your health care provider. Pregnant women should receive 1 dose of Tdap vaccine during each pregnancy. 1 dose of Td every 10 years.  Varicella vaccine.** / Consult your health care provider. Pregnant females who do not have evidence of immunity should receive the first dose after pregnancy.  Zoster vaccine.** / 1 dose for adults aged 60 years or older.  Measles, mumps, rubella (MMR) vaccine.** / You need at least 1 dose of MMR if you were born in 1957 or later. You may also need a 2nd dose. For females of childbearing age, rubella immunity should be determined. If there is no evidence of immunity, females who are not pregnant should be vaccinated. If there is no evidence of immunity, females who are pregnant should delay immunization until after pregnancy.  Pneumococcal 13-valent conjugate (PCV13) vaccine.** / Consult your health care provider.  Pneumococcal polysaccharide (PPSV23) vaccine.** / 1 to 2 doses if you smoke cigarettes or if you have certain conditions.  Meningococcal vaccine.** / Consult your health care provider.  Hepatitis A vaccine.** / Consult your health care provider.  Hepatitis B vaccine.** / Consult your health care provider.  Haemophilus influenzae type b (Hib) vaccine.** / Consult your health care provider. Ages 65   years and over  Blood pressure check.** / Every 1 to 2 years.  Lipid and cholesterol check.** / Every 5 years beginning at age 33 years.  Lung cancer screening. / Every year if you are aged 66-80 years and have a 30-pack-year history of smoking and currently smoke or have quit within the past 15 years. Yearly screening is stopped once you have quit smoking for at least 15 years or develop a health problem that would prevent you from having lung cancer  treatment.  Clinical breast exam.** / Every year after age 35 years.  BRCA-related cancer risk assessment.** / For women who have family members with a BRCA-related cancer (breast, ovarian, tubal, or peritoneal cancers).  Mammogram.** / Every year beginning at age 70 years and continuing for as long as you are in good health. Consult with your health care provider.  Pap test.** / Every 3 years starting at age 61 years through age 31 or 59 years with 3 consecutive normal Pap tests. Testing can be stopped between 65 and 70 years with 3 consecutive normal Pap tests and no abnormal Pap or HPV tests in the past 10 years.  HPV screening.** / Every 3 years from ages 32 years through ages 8 or 6 years with a history of 3 consecutive normal Pap tests. Testing can be stopped between 65 and 70 years with 3 consecutive normal Pap tests and no abnormal Pap or HPV tests in the past 10 years.  Fecal occult blood test (FOBT) of stool. / Every year beginning at age 57 years and continuing until age 78 years. You may not need to do this test if you get a colonoscopy every 10 years.  Flexible sigmoidoscopy or colonoscopy.** / Every 5 years for a flexible sigmoidoscopy or every 10 years for a colonoscopy beginning at age 50 years and continuing until age 56 years.  Hepatitis C blood test.** / For all people born from [birth year] through 1967 and any individual with known risks for hepatitis C.  Osteoporosis screening.** / A one-time screening for women ages 65 years and over and women at risk for fractures or osteoporosis.  Skin self-exam. / Monthly.  Influenza vaccine. / Every year.  Tetanus, diphtheria, and acellular pertussis (Tdap/Td) vaccine.** / 1 dose of Td every 10 years.  Varicella vaccine.** / Consult your health care provider.  Zoster vaccine.** / 1 dose for adults aged 30 years or older.  Pneumococcal 13-valent conjugate (PCV13) vaccine.** / Consult your health care provider.  Pneumococcal  polysaccharide (PPSV23) vaccine.** / 1 dose for all adults aged 64 years and older.  Meningococcal vaccine.** / Consult your health care provider.  Hepatitis A vaccine.** / Consult your health care provider.  Hepatitis B vaccine.** / Consult your health care provider.  Haemophilus influenzae type b (Hib) vaccine.** / Consult your health care provider. ** Family history and personal history of risk and conditions may change your health care provider's recommendations. Document Released: 04/16/2001 Document Revised: 07/05/2013 Document Reviewed: 07/16/2010 [facility] Patient Information 2015 [place], [place]. This information is not intended to replace advice given to you by your health care provider. Make sure you discuss any questions you have with your health care provider.

## 2013-10-07 LAB — WET PREP, GENITAL
CLUE CELLS WET PREP: NONE SEEN
TRICH WET PREP: NONE SEEN
WBC WET PREP: NONE SEEN

## 2013-10-07 LAB — CYTOLOGY - PAP

## 2013-10-07 MED ORDER — FLUCONAZOLE 150 MG PO TABS: 150.0000 mg | ORAL_TABLET | Freq: Once | ORAL | Status: DC

## 2013-10-07 NOTE — Addendum Note (Signed)
Addended by: Mora Bellman on: 10/07/2013 11:56 AM   Modules accepted: Orders

## 2013-10-07 NOTE — Progress Notes (Signed)
Notified patient of test results and that medication has been called in.

## 2013-10-12 ENCOUNTER — Telehealth: Payer: Self-pay | Admitting: *Deleted

## 2013-10-12 DIAGNOSIS — B379 Candidiasis, unspecified: Secondary | ICD-10-CM

## 2013-10-12 MED ORDER — FLUCONAZOLE 150 MG PO TABS: 150.0000 mg | ORAL_TABLET | Freq: Once | ORAL | Status: DC

## 2013-10-12 NOTE — Telephone Encounter (Signed)
Patient called and is having symptoms of a yeast infection.  I have sent in Diflucan for patient.

## 2013-11-19 ENCOUNTER — Telehealth: Payer: Self-pay

## 2013-11-19 NOTE — Telephone Encounter (Signed)
Patient developed a UTI, called in some Macrobid and also Dyflucan since she develops a yeast after taking antibiotics. Called in to a pharmacy in [place] she is out of town.

## 2013-12-19 DIAGNOSIS — M26629 Arthralgia of temporomandibular joint, unspecified side: Secondary | ICD-10-CM | POA: Insufficient documentation

## 2013-12-19 HISTORY — DX: Arthralgia of temporomandibular joint, unspecified side: M26.629

## 2014-01-03 ENCOUNTER — Encounter: Payer: Self-pay | Admitting: Obstetrics and Gynecology

## 2014-01-17 ENCOUNTER — Telehealth: Payer: Self-pay | Admitting: *Deleted

## 2014-01-17 DIAGNOSIS — G4700 Insomnia, unspecified: Secondary | ICD-10-CM

## 2014-01-17 MED ORDER — ZOLPIDEM TARTRATE 10 MG PO TABS: 10.0000 mg | ORAL_TABLET | Freq: Every evening | ORAL | Status: DC | PRN

## 2014-01-17 NOTE — Telephone Encounter (Signed)
Ambien was called into the pharmacy for the patient.

## 2014-01-17 NOTE — Telephone Encounter (Signed)
Patient called requesting refill on her Ambien 10mg 

## 2014-01-17 NOTE — Addendum Note (Signed)
Addended by: Verita Schneiders A on: 01/17/2014 01:25 PM   Modules accepted: Orders

## 2014-01-17 NOTE — Telephone Encounter (Signed)
Ambien refilled as per patient request. Please call and tell patient to pick up prescription.

## 2014-03-09 ENCOUNTER — Encounter: Payer: Self-pay | Admitting: Advanced Practice Midwife

## 2014-03-09 ENCOUNTER — Ambulatory Visit (INDEPENDENT_AMBULATORY_CARE_PROVIDER_SITE_OTHER): Payer: Self-pay | Admitting: Advanced Practice Midwife

## 2014-03-09 VITALS — BP 112/77 | HR 105 | Wt 179.0 lb

## 2014-03-09 DIAGNOSIS — R1032 Left lower quadrant pain: Secondary | ICD-10-CM

## 2014-03-09 NOTE — Progress Notes (Signed)
Subjective:     Patient ID: [name], female   DOB: 04/27/1975, 39 y.o.   MRN: [id number]  HPI 39 y.o. G1P1001 presents to office for evaluation of LLQ pain. She reports that 4-5 days ago she had acute onset of severe LLQ pain that caused her to double over in pain and brought tears to her eyes.  She denies n/v with the pain.  The pain has improved but is a dull ache now.  Patient's last menstrual period was 03/04/2014.  Her periods are regular, without any recent changes. She denies change in bowel habits/constipation or dysuria.  She does report ovarian cysts when she was younger, but no surgical treatment was needed.  She is not using contraception and does not desire pregnancy prevention at this time. She denies vaginal bleeding, vaginal itching/burning, urinary symptoms, h/a, dizziness, n/v, or fever/chills.    Review of Systems  Constitutional: Negative for fever, chills and fatigue.  Respiratory: Negative for shortness of breath.   Cardiovascular: Negative for chest pain.  Genitourinary: Positive for pelvic pain. Negative for dysuria, frequency, flank pain, vaginal bleeding, vaginal discharge, difficulty urinating and vaginal pain.  Neurological: Negative for dizziness and headaches.  Psychiatric/Behavioral: Negative.        Objective:   Physical Exam  Constitutional: She is oriented to person, place, and time. She appears well-developed and well-nourished.  Neck: Normal range of motion.  Cardiovascular: Normal rate, regular rhythm and normal heart sounds.   Pulmonary/Chest: Effort normal and breath sounds normal.  Abdominal: Soft. Bowel sounds are normal.  Musculoskeletal: Normal range of motion.  Neurological: She is alert and oriented to person, place, and time.  Skin: Skin is warm.  Psychiatric: She has a normal mood and affect. Her behavior is normal. Judgment and thought content normal.   Pelvic exam: Cervix pink, visually closed, without lesion, scant white creamy  discharge, vaginal walls and external genitalia normal Bimanual exam: Cervix 0/long/high, firm, anterior, neg CMT, uterus nontender, nonenlarged, left adnexal tenderness, none on right, no enlargement or mass bilaterally     Assessment:     1. Abdominal pain, LLQ (left lower quadrant)        Plan:     Reassurance provided to pt about normal physical exam findings.  Outpatient pelvic ultrasound ordered F/U PRN after ultrasound

## 2014-03-10 LAB — WET PREP, GENITAL
CLUE CELLS WET PREP: NONE SEEN
Trich, Wet Prep: NONE SEEN
YEAST WET PREP: NONE SEEN

## 2014-03-15 ENCOUNTER — Ambulatory Visit (HOSPITAL_COMMUNITY): Admission: RE | Admit: 2014-03-15 | Payer: Self-pay | Source: Ambulatory Visit

## 2014-03-16 ENCOUNTER — Ambulatory Visit (HOSPITAL_COMMUNITY)
Admission: RE | Admit: 2014-03-16 | Discharge: 2014-03-16 | Disposition: A | Discharge status: Home / Self Care | Payer: PRIVATE HEALTH INSURANCE | Source: Ambulatory Visit | Attending: Advanced Practice Midwife | Admitting: Advanced Practice Midwife

## 2014-03-16 DIAGNOSIS — R1032 Left lower quadrant pain: Secondary | ICD-10-CM | POA: Diagnosis not present

## 2014-06-30 ENCOUNTER — Telehealth: Payer: Self-pay | Admitting: *Deleted

## 2014-06-30 DIAGNOSIS — G4700 Insomnia, unspecified: Secondary | ICD-10-CM

## 2014-06-30 MED ORDER — ZOLPIDEM TARTRATE 10 MG PO TABS: 10.0000 mg | ORAL_TABLET | Freq: Every evening | ORAL | Status: DC | PRN

## 2014-06-30 NOTE — Telephone Encounter (Signed)
Patient is requesting refill of Ambien 10mg .

## 2014-06-30 NOTE — Telephone Encounter (Signed)
Patient called and needed a refill on Ambien.  I have sent in refills to [facility] in [place].  I gave patient 4 refills.

## 2014-08-10 ENCOUNTER — Ambulatory Visit: Payer: BLUE CROSS/BLUE SHIELD | Admitting: Obstetrics & Gynecology

## 2014-08-31 ENCOUNTER — Ambulatory Visit: Payer: BLUE CROSS/BLUE SHIELD | Admitting: Family Medicine

## 2014-09-08 ENCOUNTER — Ambulatory Visit (INDEPENDENT_AMBULATORY_CARE_PROVIDER_SITE_OTHER): Payer: BLUE CROSS/BLUE SHIELD | Admitting: Obstetrics & Gynecology

## 2014-09-08 ENCOUNTER — Encounter: Payer: Self-pay | Admitting: Obstetrics & Gynecology

## 2014-09-08 VITALS — BP 133/86 | HR 108 | Wt 174.0 lb

## 2014-09-08 DIAGNOSIS — G4700 Insomnia, unspecified: Secondary | ICD-10-CM

## 2014-09-08 DIAGNOSIS — Z1231 Encounter for screening mammogram for malignant neoplasm of breast: Secondary | ICD-10-CM

## 2014-09-08 DIAGNOSIS — F32A Depression, unspecified: Secondary | ICD-10-CM

## 2014-09-08 DIAGNOSIS — F329 Major depressive disorder, single episode, unspecified: Secondary | ICD-10-CM

## 2014-09-08 DIAGNOSIS — N926 Irregular menstruation, unspecified: Secondary | ICD-10-CM

## 2014-09-08 MED ORDER — LORAZEPAM 1 MG PO TABS: 1.0000 mg | ORAL_TABLET | Freq: Every day | ORAL | Status: DC

## 2014-09-08 MED ORDER — NAPROXEN 500 MG PO TABS: 500.0000 mg | ORAL_TABLET | Freq: Two times a day (BID) | ORAL | Status: DC

## 2014-09-08 MED ORDER — MEDROXYPROGESTERONE ACETATE 10 MG PO TABS: 20.0000 mg | ORAL_TABLET | Freq: Every day | ORAL | Status: DC

## 2014-09-08 NOTE — Patient Instructions (Signed)
Dysfunctional Uterine Bleeding Normally, menstrual periods begin between ages 79 to 53 in young women. A normal menstrual cycle/period may begin every 23 days up to 35 days and lasts from 1 to 7 days. Around 12 to 14 days before your menstrual period starts, ovulation (ovary produces an egg) occurs. When counting the time between menstrual periods, count from the first day of bleeding of the previous period to the first day of bleeding of the next period. Dysfunctional (abnormal) uterine bleeding is bleeding that is different from a normal menstrual period. Your periods may come earlier or later than usual. They may be lighter, have blood clots or be heavier. You may have bleeding between periods, or you may skip one period or more. You may have bleeding after sexual intercourse, bleeding after menopause, or no menstrual period. CAUSES   Pregnancy (normal, miscarriage, tubal).  IUDs (intrauterine device, birth control).  Birth control pills.  Hormone treatment.  Menopause.  Stress.  Infection of the cervix.  Blood clotting problems.  Infection of the inside lining of the uterus.  Endometriosis, inside lining of the uterus growing in the pelvis and other female organs.  Adhesions (scar tissue) inside the uterus.  Obesity or severe weight loss.  Uterine polyps inside the uterus.  Cancer of the vagina, cervix, or uterus.  Ovarian cysts or polycystic ovary syndrome.  Medical problems (diabetes, thyroid disease).  Uterine fibroids (noncancerous tumor).  Problems with your female hormones.  Endometrial hyperplasia, very thick lining and enlarged cells inside of the uterus.  Medicines that interfere with ovulation.  Radiation to the pelvis or abdomen.  Chemotherapy. DIAGNOSIS   Your doctor will discuss the history of your menstrual periods, medicines you are taking, changes in your weight, stress in your life, and any medical problems you may have.  Your doctor will do a  physical and pelvic examination.  Your doctor may want to perform certain tests to make a diagnosis, such as:  Pap test.  Blood tests.  Cultures for infection.  CT scan.  Ultrasound.  Hysteroscopy.  Laparoscopy.  MRI.  Hysterosalpingography.  D and C.  Endometrial biopsy. TREATMENT  Treatment will depend on the cause of the dysfunctional uterine bleeding (DUB). Treatment may include:  Observing your menstrual periods for a couple of months.  Prescribing medicines for medical problems, including:  Antibiotics.  Hormones.  Birth control pills.  Removing an IUD (intrauterine device, birth control).  Surgery:  D and C (scrape and remove tissue from inside the uterus).  Laparoscopy (examine inside the abdomen with a lighted tube).  Uterine ablation (destroy lining of the uterus with electrical current, laser, heat, or freezing).  Hysteroscopy (examine cervix and uterus with a lighted tube).  Hysterectomy (remove the uterus). HOME CARE INSTRUCTIONS   If medicines were prescribed, take exactly as directed. Do not change or switch medicines without consulting your caregiver.  Long term heavy bleeding may result in iron deficiency. Your caregiver may have prescribed iron pills. They help replace the iron that your body lost from heavy bleeding. Take exactly as directed.  Do not take aspirin or medicines that contain aspirin one week before or during your menstrual period. Aspirin may make the bleeding worse.  If you need to change your sanitary pad or tampon more than once every 2 hours, stay in bed with your feet elevated and a cold pack on your lower abdomen. Rest as much as possible, until the bleeding stops or slows down.  Eat well-balanced meals. Eat foods high in  iron. Examples are:  Leafy green vegetables.  Whole-grain breads and cereals.  Eggs.  Meat.  Liver.  Do not try to lose weight until the abnormal bleeding has stopped and your blood iron  level is back to normal. Do not lift more than ten pounds or do strenuous activities when you are bleeding.  For a couple of months, make note on your calendar, marking the start and ending of your period, and the type of bleeding (light, medium, heavy, spotting, clots or missed periods). This is for your caregiver to better evaluate your problem. SEEK MEDICAL CARE IF:   You develop nausea (feeling sick to your stomach) and vomiting, dizziness, or diarrhea while you are taking your medicine.  You are getting lightheaded or weak.  You have any problems that may be related to the medicine you are taking.  You develop pain with your DUB.  You want to remove your IUD.  You want to stop or change your birth control pills or hormones.  You have any type of abnormal bleeding mentioned above.  You are over 41 years old and have not had a menstrual period yet.  You are 39 years old and you are still having menstrual periods.  You have any of the symptoms mentioned above.  You develop a rash. SEEK IMMEDIATE MEDICAL CARE IF:   An oral temperature above 102 F (38.9 C) develops.  You develop chills.  You are changing your sanitary pad or tampon more than once an hour.  You develop abdominal pain.  You pass out or faint. Document Released: 02/16/2000 Document Revised: 05/13/2011 Document Reviewed: 01/17/2009 [facility] Patient Information 2015 [place], [place]. This information is not intended to replace advice given to you by your health care provider. Make sure you discuss any questions you have with your health care provider.

## 2014-09-08 NOTE — Progress Notes (Signed)
CLINIC ENCOUNTER NOTE  History:  39 y.o. G1P1001 here today for evaluation of irregular bleeding for the past two months.  Patient reports having periods twice a month since the last two months with moderate flow and cramping.  Of note, patient's father recently died and her husband was diagnosed with a neck tumor.  She is very stressed out and crying during this encounter. She reports depressed mood and continued insomnia not helped by Ambien.  She denies any abnormal vaginal discharge, bleeding, pelvic pain or other concerns.   Past Medical History  Diagnosis Date  . Anxiety   . Migraine headache   . Muscle spasm 01/2010    History reviewed. No pertinent past surgical history.  The following portions of the patient's history were reviewed and updated as appropriate: allergies, current medications, past family history, past medical history, past social history, past surgical history and problem list.   Health Maintenance:  Normal pap on 10/06/2013.  Review of Systems:  Behavioral/Psych: positive for decreased appetite, fatigue, loss of interest in favorite activities and insomnia Comprehensive review of systems was otherwise negative.  Objective:  Physical Exam BP 133/86 mmHg  Pulse 108  Wt 174 lb (78.926 kg)  LMP 08/25/2014 CONSTITUTIONAL: Well-developed, well-nourished female in no acute distress.  HENT:  Normocephalic, atraumatic. External right and left ear normal. Oropharynx is clear and moist EYES: Conjunctivae and EOM are normal. Pupils are equal, round, and reactive to light. No scleral icterus.  NECK: Normal range of motion, supple, no masses SKIN: Skin is warm and dry. No rash noted. Not diaphoretic. No erythema. No pallor. [place]: Alert and oriented to person, place, and time. Normal reflexes, muscle tone coordination. No cranial nerve deficit noted. PSYCHIATRIC: Depressed mood and affect. Normal behavior. Normal judgment and thought content. CARDIOVASCULAR: Normal  heart rate noted RESPIRATORY: Effort and breath sounds normal, no problems with respiration noted ABDOMEN: Soft, no distention noted.   PELVIC: Normal appearing external genitalia; normal appearing vaginal mucosa and cervix.  No abnormal discharge noted.  Normal uterine size, no other palpable masses, no uterine or adnexal tenderness. MUSCULOSKELETAL: Normal range of motion. No edema noted.  Labs and Imaging 04/03/2014 TRANSABDOMINAL AND TRANSVAGINAL ULTRASOUND OF PELVIS   COMPARISON: None  FINDINGS: Uterus Measurements: 8.8 x 4.9 x 5.3 cm. No fibroids or other mass visualized. Endometrium  Thickness: 4 mm. No focal abnormality visualized. Right ovary Measurements: 2.9 x 1.8 x 3.0 cm. Normal appearance/no adnexal mass. Left ovary Measurements: 2.1 x 1.4 x 2.1 cm. Normal appearance/no adnexal mass. No free fluid. IMPRESSION: Normal appearance of uterus and both ovaries. No pelvic mass or other sonographic abnormality identified. Electronically Signed By: Earle Gell M.D.  Assessment & Plan:   1. Irregular menses Likely due to stressor in her life, but will check AUB labs. Normal pelvic ultrasound six months ago - CBC - TSH - hCG, quantitative, pregnancy - Testosterone, Free, Total, SHBG  - naproxen (NAPROSYN) 500 MG tablet; Take 1 tablet (500 mg total) by mouth 2 (two) times daily with a meal. As needed for pain  Dispense: 60 tablet; Refill: 2 - medroxyPROGESTERone (PROVERA) 10 MG tablet; Take 2 tablets (20 mg total) by mouth daily.  Dispense: 60 tablet; Refill: 10 Will monitor response to NSAID and progestin therapy.  Bleeding precautions reviewed  2. Depression and Insomnia Patient is grieving and stressed out about husband's health but may still benefit from consultation with mental health provider.  She agreed to this referral which will be made for her. In  the meantime, I prescribed the following for insomnia.  Cautioned about risks of dependence. - LORazepam (ATIVAN) 1 MG  tablet; Take 1 tablet (1 mg total) by mouth at bedtime.  Dispense: 30 tablet; Refill: 5  3. Routine preventative health maintenance Pap is up to date.  Mammogram ordered - MM DIGITAL SCREENING BILATERAL  Please refer to After Visit Summary for other counseling recommendations.    Total face-to-face time with patient: 25 minutes. Over 50% of encounter was spent on counseling and coordination of care.   Verita Schneiders, MD, [place] Attending [place] for [employer], [place]

## 2014-09-09 LAB — HCG, QUANTITATIVE, PREGNANCY

## 2014-09-09 LAB — TESTOSTERONE, FREE, TOTAL, SHBG
SEX HORMONE BINDING: 46 nmol/L (ref 17–124)
TESTOSTERONE: 41 ng/dL (ref 10–70)
Testosterone, Free: 6 pg/mL (ref 0.6–6.8)
Testosterone-% Free: 1.5 % (ref 0.4–2.4)

## 2014-09-09 LAB — CBC
HCT: 41.1 % (ref 36.0–46.0)
HEMOGLOBIN: 13.8 g/dL (ref 12.0–15.0)
MCH: 29.3 pg (ref 26.0–34.0)
MCHC: 33.6 g/dL (ref 30.0–36.0)
MCV: 87.3 fL (ref 78.0–100.0)
MPV: 9.5 fL (ref 8.6–12.4)
PLATELETS: 340 10*3/uL (ref 150–400)
RBC: 4.71 MIL/uL (ref 3.87–5.11)
RDW: 14.3 % (ref 11.5–15.5)
WBC: 8.9 10*3/uL (ref 4.0–10.5)

## 2014-09-09 LAB — TSH: TSH: 1.227 u[IU]/mL (ref 0.350–4.500)

## 2014-10-03 DIAGNOSIS — N83209 Unspecified ovarian cyst, unspecified side: Secondary | ICD-10-CM

## 2014-10-03 HISTORY — DX: Unspecified ovarian cyst, unspecified side: N83.209

## 2014-10-31 ENCOUNTER — Ambulatory Visit (INDEPENDENT_AMBULATORY_CARE_PROVIDER_SITE_OTHER): Payer: BLUE CROSS/BLUE SHIELD | Admitting: Obstetrics & Gynecology

## 2014-10-31 ENCOUNTER — Encounter: Payer: Self-pay | Admitting: Obstetrics & Gynecology

## 2014-10-31 VITALS — BP 119/75 | HR 108 | Resp 18 | Ht 65.5 in | Wt 181.0 lb

## 2014-10-31 DIAGNOSIS — R1031 Right lower quadrant pain: Secondary | ICD-10-CM

## 2014-10-31 NOTE — Progress Notes (Signed)
   Subjective:    Patient ID: [name], female    DOB: 09-12-1975, 39 y.o.   MRN: [id number]  HPI 39 yo AA P4 (39 yo daughter) here with the complaint of RLQ pain for 2 months. She saw Dr. Harolyn Rutherford last month for DUB.  She says that the pain is intermittent, about every other day, lasts about 2 hours, taking Naproxyn with no help. She says that GC/CT not likely.   Review of Systems     Objective:   Physical Exam        Assessment & Plan:  RLQ pain- check gyn u/s Refill of ambien given, but clarified that she should only take 5 mg not 10.

## 2014-11-02 ENCOUNTER — Ambulatory Visit (HOSPITAL_COMMUNITY)
Admission: RE | Admit: 2014-11-02 | Discharge: 2014-11-02 | Disposition: A | Discharge status: Home / Self Care | Payer: PRIVATE HEALTH INSURANCE | Source: Ambulatory Visit | Attending: Obstetrics & Gynecology | Admitting: Obstetrics & Gynecology

## 2014-11-02 ENCOUNTER — Telehealth: Payer: Self-pay | Admitting: *Deleted

## 2014-11-02 ENCOUNTER — Other Ambulatory Visit: Payer: Self-pay | Admitting: Obstetrics & Gynecology

## 2014-11-02 DIAGNOSIS — N83201 Unspecified ovarian cyst, right side: Secondary | ICD-10-CM

## 2014-11-02 DIAGNOSIS — R1031 Right lower quadrant pain: Secondary | ICD-10-CM | POA: Insufficient documentation

## 2014-11-02 DIAGNOSIS — N831 Corpus luteum cyst: Secondary | ICD-10-CM | POA: Diagnosis not present

## 2014-11-02 DIAGNOSIS — R102 Pelvic and perineal pain: Secondary | ICD-10-CM | POA: Diagnosis not present

## 2014-11-02 DIAGNOSIS — N83202 Unspecified ovarian cyst, left side: Secondary | ICD-10-CM

## 2014-11-02 NOTE — Telephone Encounter (Signed)
Received call from Dr. Gala Romney to notify pt to come back to [facility] for additional [place] screening, informed pt of result and for her to go to [facility] [place] department for additional imaging.  Pt acknowledged and will stop by office prior to going to [facility] for [place] result report.

## 2014-11-03 ENCOUNTER — Telehealth: Payer: Self-pay | Admitting: *Deleted

## 2014-11-03 NOTE — Telephone Encounter (Signed)
Informed pt of rx for Norco that she could pick up in office per Dr Gala Romney due to hemorrhagic cyst.  Will pick up today.

## 2014-11-04 ENCOUNTER — Telehealth: Payer: Self-pay | Admitting: Obstetrics & Gynecology

## 2014-11-04 DIAGNOSIS — N83201 Unspecified ovarian cyst, right side: Secondary | ICD-10-CM

## 2014-11-04 NOTE — Telephone Encounter (Signed)
Called aptient informed her of second cyst found on doppler US.  Most likely both cysts are benign.  Neither cyst was there in January 2016. Will f/u US in 8 weeks to look for resolution.  Pt got relief from [place] will schedule [place] and call pt with appt next. Week.

## 2014-11-08 ENCOUNTER — Telehealth: Payer: Self-pay | Admitting: *Deleted

## 2014-11-08 DIAGNOSIS — N83209 Unspecified ovarian cyst, unspecified side: Secondary | ICD-10-CM

## 2014-11-08 NOTE — Telephone Encounter (Signed)
Ultrasound called and needed a pelvic US added on to the other orders to complete the ultrasound.

## 2014-11-09 ENCOUNTER — Telehealth: Payer: Self-pay | Admitting: *Deleted

## 2014-11-09 DIAGNOSIS — N83209 Unspecified ovarian cyst, unspecified side: Secondary | ICD-10-CM

## 2014-11-09 MED ORDER — HYDROCODONE-ACETAMINOPHEN 5-325 MG PO TABS: 1.0000 | ORAL_TABLET | Freq: Four times a day (QID) | ORAL | Status: DC | PRN

## 2014-11-09 NOTE — Addendum Note (Signed)
Addended by: Verita Schneiders A on: 11/09/2014 11:38 AM   Modules accepted: Orders

## 2014-11-09 NOTE — Telephone Encounter (Signed)
Pt dx with rt hemorrhagic ovarian cyst last week, given rx for Norco, has 3 tablets left and still has occasional pain flare ups, requesting refill on Norco.

## 2014-11-21 ENCOUNTER — Ambulatory Visit (INDEPENDENT_AMBULATORY_CARE_PROVIDER_SITE_OTHER): Payer: BLUE CROSS/BLUE SHIELD | Admitting: Obstetrics & Gynecology

## 2014-11-21 ENCOUNTER — Encounter: Payer: Self-pay | Admitting: Obstetrics & Gynecology

## 2014-11-21 ENCOUNTER — Encounter: Payer: Self-pay | Admitting: *Deleted

## 2014-11-21 VITALS — BP 129/85 | HR 114 | Resp 18 | Wt 180.0 lb

## 2014-11-21 DIAGNOSIS — G4700 Insomnia, unspecified: Secondary | ICD-10-CM

## 2014-11-21 DIAGNOSIS — Z1231 Encounter for screening mammogram for malignant neoplasm of breast: Secondary | ICD-10-CM

## 2014-11-21 DIAGNOSIS — N83201 Unspecified ovarian cyst, right side: Secondary | ICD-10-CM

## 2014-11-21 DIAGNOSIS — N8320 Unspecified ovarian cysts: Secondary | ICD-10-CM

## 2014-11-21 MED ORDER — IBUPROFEN 800 MG PO TABS: 800.0000 mg | ORAL_TABLET | Freq: Three times a day (TID) | ORAL | Status: DC | PRN

## 2014-11-21 MED ORDER — TRAMADOL HCL 50 MG PO TABS: 50.0000 mg | ORAL_TABLET | Freq: Four times a day (QID) | ORAL | Status: DC | PRN

## 2014-11-21 MED ORDER — ZOLPIDEM TARTRATE 10 MG PO TABS: 10.0000 mg | ORAL_TABLET | Freq: Every evening | ORAL | Status: DC | PRN

## 2014-11-21 NOTE — Progress Notes (Signed)
CLINIC ENCOUNTER NOTE  History:  39 y.o. G1P1001 here today for problems with insomnia after dropping dose of Ambien down to 5 mg from 10 mg; desires going back to 10 mg.  Also reports occasional RLQ severe pain attributed to her right ovarian cyst; desires refill of pain medication.  Pain is not associated with fevers, chills, nausea or other symptoms.  She denies any abnormal vaginal discharge, bleeding or other concerns.   Past Medical History  Diagnosis Date  . Anxiety   . Migraine headache   . Muscle spasm 01/2010  . Ovarian cyst 10-2014    right    History reviewed. No pertinent past surgical history.  The following portions of the patient's history were reviewed and updated as appropriate: allergies, current medications, past family history, past medical history, past social history, past surgical history and problem list.   Health Maintenance:  Normal pap on 10/06/13.   Review of Systems:  Pertinent items are noted in HPI. Comprehensive review of systems was otherwise negative.  Objective:  Physical Exam BP 129/85 mmHg  Pulse 114  Resp 18  Wt 180 lb (81.647 kg)  LMP 10/10/2014 (Approximate) CONSTITUTIONAL: Well-developed, well-nourished female in no acute distress.  HENT:  Normocephalic, atraumatic. External right and left ear normal. Oropharynx is clear and moist EYES: Conjunctivae and EOM are normal. Pupils are equal, round, and reactive to light. No scleral icterus.  NECK: Normal range of motion, supple, no masses SKIN: Skin is warm and dry. No rash noted. Not diaphoretic. No erythema. No pallor. [place]: Alert and oriented to person, place, and time. Normal reflexes, muscle tone coordination. No cranial nerve deficit noted. PSYCHIATRIC: Normal mood and affect. Normal behavior. Normal judgment and thought content. CARDIOVASCULAR: Normal heart rate noted RESPIRATORY: Effort and breath sounds normal, no problems with respiration noted ABDOMEN: Soft, no distention  noted, mild RLQ tenderness to palpation, no rebound, no guarding.   PELVIC: Deferred MUSCULOSKELETAL: Normal range of motion. No edema noted.  Labs and Imaging US Transvaginal Non-ob  11/02/2014   CLINICAL DATA:  39 year old female with right lower quadrant pain for several months, with recent worsening in the last 3 days. LMP 10/10/2014, day 24 of menstrual cycle.  EXAM: TRANSABDOMINAL AND TRANSVAGINAL ULTRASOUND OF PELVIS  TECHNIQUE: Both transabdominal and transvaginal ultrasound examinations of the pelvis were performed. Transabdominal technique was performed for global imaging of the pelvis including uterus, ovaries, adnexal regions, and pelvic cul-de-sac. It was necessary to proceed with endovaginal exam following the transabdominal exam to visualize the endometrium, myometrium and left ovary.  COMPARISON:  03/16/2014 pelvic sonogram.  FINDINGS: Uterus  Measurements: 9.7 x 4.7 x 6.2 cm. The anteverted anteflexed uterus is mildly enlarged in size, and is normal in configuration. There are no uterine fibroids or other myometrial abnormalities.  Endometrium  Thickness: 7 mm.  No focal abnormality visualized.  Right ovary  Measurements: 6.3 x 4.0 x 3.0 cm. There is a 2.2 cm hemorrhagic corpus luteum in the right ovary. Vascularity is demonstrated within the right ovary on color Doppler. The right ovary is prominently asymmetrically enlarged and is located posterior to the right lower uterus near the midline.  Left ovary  Measurements: 2.4 x 1.6 x 2.3 cm. Normal appearance/no adnexal mass.  Other findings  There is a small volume of nonspecific free fluid in the right adnexa and pelvic cul-de-sac.  IMPRESSION: 1. Right ovarian 2.2 cm hemorrhagic corpus luteum. Small volume nonspecific free fluid in the right adnexa and pelvic cul-de-sac. The right ovary  is prominently asymmetrically enlarged (beyond the expected enlargement from the hemorrhagic right ovarian cyst) and in a slightly atypical location posterior  to the lower uterus near the midline. Vascularity is seen within the right ovary on color Doppler, although spectral Doppler analysis of the ovaries was not performed on this routine scan. As such, torsion-detorsion sequence of the right ovary cannot be excluded, and a complete pelvic sonogram with color and spectral Doppler should be considered for further evaluation, as clinically warranted. 2. No uterine fibroids.  No endometrial abnormality. These results were called by telephone at the time of interpretation on 11/02/2014 at 11:30 am to Dr. Silas [place], who verbally acknowledged these results.   Electronically Signed   By: Ilona Sorrel M.D.   On: 11/02/2014 11:49   US Pelvis Complete  11/02/2014   CLINICAL DATA:  39 year old female with right lower quadrant pain for several months, with recent worsening in the last 3 days. LMP 10/10/2014, day 24 of menstrual cycle.  EXAM: TRANSABDOMINAL AND TRANSVAGINAL ULTRASOUND OF PELVIS  TECHNIQUE: Both transabdominal and transvaginal ultrasound examinations of the pelvis were performed. Transabdominal technique was performed for global imaging of the pelvis including uterus, ovaries, adnexal regions, and pelvic cul-de-sac. It was necessary to proceed with endovaginal exam following the transabdominal exam to visualize the endometrium, myometrium and left ovary.  COMPARISON:  03/16/2014 pelvic sonogram.  FINDINGS: Uterus  Measurements: 9.7 x 4.7 x 6.2 cm. The anteverted anteflexed uterus is mildly enlarged in size, and is normal in configuration. There are no uterine fibroids or other myometrial abnormalities.  Endometrium  Thickness: 7 mm.  No focal abnormality visualized.  Right ovary  Measurements: 6.3 x 4.0 x 3.0 cm. There is a 2.2 cm hemorrhagic corpus luteum in the right ovary. Vascularity is demonstrated within the right ovary on color Doppler. The right ovary is prominently asymmetrically enlarged and is located posterior to the right lower uterus near the  midline.  Left ovary  Measurements: 2.4 x 1.6 x 2.3 cm. Normal appearance/no adnexal mass.  Other findings  There is a small volume of nonspecific free fluid in the right adnexa and pelvic cul-de-sac.  IMPRESSION: 1. Right ovarian 2.2 cm hemorrhagic corpus luteum. Small volume nonspecific free fluid in the right adnexa and pelvic cul-de-sac. The right ovary is prominently asymmetrically enlarged (beyond the expected enlargement from the hemorrhagic right ovarian cyst) and in a slightly atypical location posterior to the lower uterus near the midline. Vascularity is seen within the right ovary on color Doppler, although spectral Doppler analysis of the ovaries was not performed on this routine scan. As such, torsion-detorsion sequence of the right ovary cannot be excluded, and a complete pelvic sonogram with color and spectral Doppler should be considered for further evaluation, as clinically warranted. 2. No uterine fibroids.  No endometrial abnormality. These results were called by telephone at the time of interpretation on 11/02/2014 at 11:30 am to Dr. Silas [place], who verbally acknowledged these results.   Electronically Signed   By: Ilona Sorrel M.D.   On: 11/02/2014 11:49   [place] Art/ven Flow Abd Pelv Doppler  11/02/2014   ADDENDUM REPORT: 11/02/2014 17:55  ADDENDUM: There is a focal area of relative hypo echogenicity with posterior acoustic shadowing associated with the right ovary measuring 2.4 x 1.7 cm. This was not identified on the study from earlier today. Further, this was not present on the exam from 03/16/2014 and is of doubtful clinical significance. Short-interval follow up ultrasound in 6-12 weeks is recommended, preferably  during the week following the patient's normal menses. If persistent then further investigation with contrast enhanced pelvic MRI would be recommended.   Electronically Signed   By: Kerby Moors M.D.   On: 11/02/2014 17:55   11/02/2014   CLINICAL DATA:  Pelvic pain.  Evaluate  for torsion.  EXAM: DOPPLER ULTRASOUND OF OVARIES  TECHNIQUE: Color and duplex Doppler ultrasound was utilized to evaluate blood flow to the ovaries.  COMPARISON:  Earlier today.  FINDINGS: Pulsed Doppler evaluation of both ovaries demonstrates normal low-resistance arterial and venous waveforms.  IMPRESSION: No evidence for ovarian torsion.  Electronically Signed: By: Kerby Moors M.D. On: 11/02/2014 15:57    Assessment & Plan:   1. Insomnia Discussed that Ambien 5 mg is preferred for women as per certain studies as side effects were noted to be exaggerated in women (discussed with [facility] Pharmacists).  However, given her level of tolerance and that she has been evaluated by Sleep Medicine Doctors and placed on the Ambien 10 mg dosage for a few years; will revert to 10 mg dosage. - zolpidem (AMBIEN) 10 MG tablet; Take 1 tablet (10 mg total) by mouth at bedtime as needed for sleep.  Dispense: 30 tablet; Refill: 5  2. Cyst of right ovary Reviewed ultrasound findings, recommended Ibuprofen prn pain; Tramadol prn severe pain (in lieu of Vicodin that she used in past that resulted in significant itching).  Follow up ultrasound ordered to reevaluate right ovarian cyst; will follow up results and manage accordingly. - ibuprofen (ADVIL,MOTRIN) 800 MG tablet; Take 1 tablet (800 mg total) by mouth 3 (three) times daily with meals as needed for headache or moderate pain.  Dispense: 30 tablet; Refill: 3 - traMADol (ULTRAM) 50 MG tablet; Take 1 tablet (50 mg total) by mouth every 6 (six) hours as needed for severe pain.  Dispense: 30 tablet; Refill: 0  3. Visit for screening mammogram - MM DIGITAL SCREENING BILATERAL; Future   Other routine preventative health maintenance measures emphasized.  Please refer to After Visit Summary for other counseling recommendations.    Verita Schneiders, MD, [place] Attending Obstetrician & Gynecologist, [place] [facility]

## 2014-12-19 ENCOUNTER — Ambulatory Visit (HOSPITAL_COMMUNITY)
Admission: RE | Admit: 2014-12-19 | Discharge: 2014-12-19 | Disposition: A | Discharge status: Home / Self Care | Payer: BLUE CROSS/BLUE SHIELD | Source: Ambulatory Visit | Attending: Obstetrics & Gynecology | Admitting: Obstetrics & Gynecology

## 2014-12-19 DIAGNOSIS — N831 Corpus luteum cyst of ovary, unspecified side: Secondary | ICD-10-CM | POA: Insufficient documentation

## 2014-12-19 DIAGNOSIS — N83201 Unspecified ovarian cyst, right side: Secondary | ICD-10-CM | POA: Diagnosis present

## 2014-12-22 ENCOUNTER — Telehealth: Payer: Self-pay | Admitting: *Deleted

## 2014-12-22 NOTE — Telephone Encounter (Signed)
Called pt, no answer, left message to call office.

## 2014-12-22 NOTE — Telephone Encounter (Signed)
-----   Message from Guss Bunde, MD sent at 12/22/2014 12:36 PM EDT ----- 1. Hypoechoic structure in the right ovary is favored to represent resolving hemorrhagic cyst. Continued follow-up is advised to ensure resolution. The next follow up ultrasound should be obtained in 6-12 weeks, preferably during the week following the patient's normal Menses.  RN to call patient.   Ovarian cyst resolving and will need follow up as above

## 2014-12-26 ENCOUNTER — Telehealth: Payer: Self-pay | Admitting: *Deleted

## 2014-12-26 DIAGNOSIS — N83201 Unspecified ovarian cyst, right side: Secondary | ICD-10-CM

## 2014-12-26 MED ORDER — HYDROCODONE-ACETAMINOPHEN 5-325 MG PO TABS: 1.0000 | ORAL_TABLET | Freq: Four times a day (QID) | ORAL | Status: DC | PRN

## 2014-12-26 NOTE — Telephone Encounter (Signed)
-----   Message from Guss Bunde, MD sent at 12/22/2014 12:36 PM EDT ----- 1. Hypoechoic structure in the right ovary is favored to represent resolving hemorrhagic cyst. Continued follow-up is advised to ensure resolution. The next follow up ultrasound should be obtained in 6-12 weeks, preferably during the week following the patient's normal Menses.  RN to call patient.   Ovarian cyst resolving and will need follow up as above

## 2014-12-26 NOTE — Telephone Encounter (Signed)
Patient is aware of results.  Patient will call back to schedule ultrasound.  Patient was asking for pain medications for the pain so Dr. Kennon Rounds gave patient Vicodin #20.  Patient husband is picking up at the office today.

## 2015-01-02 ENCOUNTER — Telehealth: Payer: Self-pay | Admitting: *Deleted

## 2015-01-02 NOTE — Telephone Encounter (Signed)
Pt called had a few questions about last [place] and follow-up, informed her that ovarian cyst was resolving and Dr Gala Romney recommended follow-up US in 6-12 weeks.  Also reviewed medication administration for pain relief.  Pt will call back in November to inform us of menstrual cycle date and [place] will need to be scheduled for a week after her menstrual cycle in the month of December. Pt acknowledged instructions.

## 2015-01-03 ENCOUNTER — Ambulatory Visit (HOSPITAL_COMMUNITY): Payer: BLUE CROSS/BLUE SHIELD

## 2015-01-09 ENCOUNTER — Encounter: Payer: Self-pay | Admitting: Family Medicine

## 2015-01-09 ENCOUNTER — Ambulatory Visit (INDEPENDENT_AMBULATORY_CARE_PROVIDER_SITE_OTHER): Payer: BLUE CROSS/BLUE SHIELD | Admitting: Family Medicine

## 2015-01-09 VITALS — BP 124/64 | HR 102 | Temp 98.7°F | Ht 65.5 in | Wt 176.8 lb

## 2015-01-09 DIAGNOSIS — J209 Acute bronchitis, unspecified: Secondary | ICD-10-CM | POA: Diagnosis not present

## 2015-01-09 DIAGNOSIS — Z0000 Encounter for general adult medical examination without abnormal findings: Secondary | ICD-10-CM | POA: Diagnosis not present

## 2015-01-09 DIAGNOSIS — F419 Anxiety disorder, unspecified: Secondary | ICD-10-CM | POA: Insufficient documentation

## 2015-01-09 MED ORDER — BENZONATATE 100 MG PO CAPS: 100.0000 mg | ORAL_CAPSULE | Freq: Three times a day (TID) | ORAL | Status: DC | PRN

## 2015-01-09 MED ORDER — DOXYCYCLINE HYCLATE 100 MG PO TABS: 100.0000 mg | ORAL_TABLET | Freq: Two times a day (BID) | ORAL | Status: DC

## 2015-01-09 MED ORDER — HYDROCODONE-HOMATROPINE 5-1.5 MG/5ML PO SYRP: 5.0000 mL | ORAL_SOLUTION | Freq: Three times a day (TID) | ORAL | Status: DC | PRN

## 2015-01-09 NOTE — Patient Instructions (Signed)
Take the antibiotic twice daily for 10 days.  Use the cough syrup at night.  Use the tessalon during the day.  Follow up annually or sooner if needed.  Take care  Dr. Lacinda Axon

## 2015-01-09 NOTE — Assessment & Plan Note (Signed)
Mammogram scheduled.

## 2015-01-09 NOTE — Progress Notes (Signed)
Pre visit review using our clinic review tool, if applicable. No additional management support is needed unless otherwise documented below in the visit note. 

## 2015-01-09 NOTE — Assessment & Plan Note (Signed)
Given systemic symptoms and duration of illness, treating with doxycycline. Also giving Hycodan and Tessalon for cough.

## 2015-01-09 NOTE — Progress Notes (Signed)
Subjective:  Patient ID: [name], female    DOB: 1975-10-17  Age: 39 y.o. MRN: [id number]  CC: New patient; Cough  HPI [name] is a 39 y.o. female presents to the clinic today with complaints of cough.  1) Cough  Patient reports that she's been sick since this past Tuesday.  She's had a known sick contact as her daughter has been sick as well.  She's been experiencing significant cough which is nonproductive. It is quite severe and interferes with sleep.  She's been taking over-the-counter cough medication with no relief.  She reports associated fever, Tmax 102. She took Tylenol for her fever with improvement.  No exacerbating factors.  No reported shortness of breath. She does note generalized fatigue and not feeling well.  PMH, Surgical Hx, Family Hx, Social History reviewed and updated as below.  Past Medical History  Diagnosis Date  . Anxiety   . Migraine headache   . Ovarian cyst 10-2014    right  . Chicken pox    Past Surgical History  Procedure Laterality Date  . Tonsillectomy  1980    Family History  Problem Relation Age of Onset  . Hyperlipidemia Mother   . Heart disease Father   . Diabetes Father   . COPD Father   . Arthritis Maternal Aunt    Social History  Substance Use Topics  . Smoking status: Never Smoker   . Smokeless tobacco: Never Used  . Alcohol Use: 0.6 oz/week    1 Glasses of wine per week     Comment: occassionally   Review of Systems  Constitutional: Positive for fever.  HENT: Positive for tinnitus.   Respiratory: Positive for cough.   Neurological: Positive for weakness and headaches.  Psychiatric/Behavioral:       Sadness.   All other systems reviewed and are negative.   Objective:   Today's Vitals: BP 124/64 mmHg  Pulse 102  Temp(Src) 98.7 F (37.1 C) (Oral)  Ht 5' 5.5" (1.664 m)  Wt 176 lb 12 oz (80.173 kg)  BMI 28.95 kg/m2  SpO2 97%  Physical Exam  Constitutional: She is oriented to  person, place, and time. She appears well-developed. No distress.  Appears fatigued.  HENT:  Head: Normocephalic and atraumatic.  Mouth/Throat: No oropharyngeal exudate.  Normal Tm's bilaterally.   Eyes: Conjunctivae are normal. Pupils are equal, round, and reactive to light. Right eye exhibits no discharge. Left eye exhibits no discharge.  Neck: Neck supple.  Cardiovascular: Regular rhythm.  Tachycardia present.   Pulmonary/Chest: Effort normal. No respiratory distress. She has no wheezes. She has no rales.  Abdominal: Soft. She exhibits no distension. There is no tenderness. There is no rebound and no guarding.  Musculoskeletal: Normal range of motion.  Lymphadenopathy:    She has no cervical adenopathy.  Neurological: She is alert and oriented to person, place, and time.  Skin: Skin is warm and dry. No rash noted.  Psychiatric: She has a normal mood and affect.  Vitals reviewed.  Assessment & Plan:   Problem List Items Addressed This Visit    Preventative health care    Mammogram scheduled.      Acute bronchitis - Primary    Given systemic symptoms and duration of illness, treating with doxycycline. Also giving Hycodan and Tessalon for cough.         Outpatient Encounter Prescriptions as of 01/09/2015  Medication Sig  . amitriptyline (ELAVIL) 75 MG tablet TAKE 1 TABLET BY MOUTH NIGHTLY.  Marland Kitchen ibuprofen (ADVIL,MOTRIN)  800 MG tablet Take 1 tablet (800 mg total) by mouth 3 (three) times daily with meals as needed for headache or moderate pain.  Marland Kitchen levocetirizine (XYZAL) 5 MG tablet Take 5 mg by mouth every evening.  Marland Kitchen LORazepam (ATIVAN) 1 MG tablet Take 1 tablet (1 mg total) by mouth at bedtime.  . promethazine (PHENERGAN) 25 MG tablet Take 25 mg by mouth every 6 (six) hours as needed.    . traMADol (ULTRAM) 50 MG tablet Take 1 tablet (50 mg total) by mouth every 6 (six) hours as needed for severe pain.  Marland Kitchen zolmitriptan (ZOMIG) 5 MG tablet   . zolpidem (AMBIEN) 10 MG tablet Take  1 tablet (10 mg total) by mouth at bedtime as needed for sleep.  . [DISCONTINUED] HYDROcodone-acetaminophen (NORCO/VICODIN) 5-325 MG tablet Take 1 tablet by mouth every 6 (six) hours as needed. for pain  . benzonatate (TESSALON) 100 MG capsule Take 1 capsule (100 mg total) by mouth 3 (three) times daily as needed.  . doxycycline (VIBRA-TABS) 100 MG tablet Take 1 tablet (100 mg total) by mouth 2 (two) times daily.  Marland Kitchen HYDROcodone-homatropine (HYCODAN) 5-1.5 MG/5ML syrup Take 5 mLs by mouth every 8 (eight) hours as needed for cough.  . medroxyPROGESTERone (PROVERA) 10 MG tablet Take 2 tablets (20 mg total) by mouth daily. (Patient not taking: Reported on 11/21/2014)  . [DISCONTINUED] HYDROcodone-acetaminophen (NORCO/VICODIN) 5-325 MG tablet Take 1 tablet by mouth every 6 (six) hours as needed for moderate pain.  . [DISCONTINUED] naproxen (NAPROSYN) 500 MG tablet Take 1 tablet (500 mg total) by mouth 2 (two) times daily with a meal. As needed for pain (Patient not taking: Reported on 11/21/2014)   No facility-administered encounter medications on file as of 01/09/2015.   Follow-up: Annually  [place]

## 2015-01-30 ENCOUNTER — Encounter: Payer: Self-pay | Admitting: Family Medicine

## 2015-01-30 ENCOUNTER — Ambulatory Visit (INDEPENDENT_AMBULATORY_CARE_PROVIDER_SITE_OTHER): Payer: BLUE CROSS/BLUE SHIELD | Admitting: Family Medicine

## 2015-01-30 DIAGNOSIS — G43009 Migraine without aura, not intractable, without status migrainosus: Secondary | ICD-10-CM | POA: Diagnosis not present

## 2015-01-30 DIAGNOSIS — J0100 Acute maxillary sinusitis, unspecified: Secondary | ICD-10-CM | POA: Diagnosis not present

## 2015-01-30 MED ORDER — LEVOFLOXACIN 500 MG PO TABS: 500.0000 mg | ORAL_TABLET | Freq: Every day | ORAL | Status: DC

## 2015-01-30 MED ORDER — LEVOCETIRIZINE DIHYDROCHLORIDE 5 MG PO TABS: 5.0000 mg | ORAL_TABLET | Freq: Every evening | ORAL | Status: DC

## 2015-01-30 MED ORDER — AMITRIPTYLINE HCL 75 MG PO TABS: 75.0000 mg | ORAL_TABLET | Freq: Every day | ORAL | Status: DC

## 2015-01-30 NOTE — Progress Notes (Addendum)
Subjective:  Patient ID: [name], female    DOB: [birth year]  Age: 39 y.o. MRN: [id number]  CC: Runny nose, sinus pressure/congestion, ear pain, sore throat, body aches  HPI:  39 year old female presents to clinic today with the above complaints.  Patient was previously seen on 11/7 and was treated for acute bronchitis. She states that following that visit she improved and was doing well. She subsequently went on a trip to [place] for the holidays and got sick once again. This time she developed body aches, fever, runny nose and congestion, ear pain as well as sore throat. She reports her last fever was approximately 3 days ago. No exacerbating or relieving factors. No interventions tried. She reports her symptoms are severe and persistent. Of note, her daughter is also sick.  Social Hx   Social History   Social History  . Marital Status: Married    Spouse Name: N/A  . Number of Children: N/A  . Years of Education: N/A   Social History Main Topics  . Smoking status: Never Smoker   . Smokeless tobacco: Never Used  . Alcohol Use: 0.6 oz/week    1 Glasses of wine per week     Comment: occassionally  . Drug Use: No  . Sexual Activity:    Partners: Male    Birth Control/ Protection: None   Other Topics Concern  . None   Social History Narrative   Review of Systems  Constitutional: Positive for fever.  HENT: Positive for congestion, ear pain, postnasal drip, rhinorrhea, sinus pressure and sore throat.    Objective:  BP 118/72 mmHg  Pulse 94  Temp(Src) 98.5 F (36.9 C) (Oral)  Ht 5' 5.5" (1.664 m)  Wt 178 lb 4 oz (80.854 kg)  BMI 29.20 kg/m2  SpO2 97%  BP/Weight 04/13/2015 03/01/2015 [id number]  Systolic BP [id number]  Diastolic BP 82 68 72  Wt. (Lbs) 177.13 181.75 178.25  BMI 29.02 29.77 29.2   Physical Exam  Constitutional: She appears well-developed. No distress.  HENT:  Head: Normocephalic and atraumatic.  Mouth/Throat: No oropharyngeal exudate.   Normal TM's bilaterally.  Maxillary sinuses are exquisitely tender to palpation.  Eyes: Right eye exhibits no discharge. Left eye exhibits no discharge. No scleral icterus.  Neck: Neck supple.  Cardiovascular: Normal rate and regular rhythm.   No murmur heard. Pulmonary/Chest: Effort normal and breath sounds normal. She has no wheezes. She has no rales.  Neurological: She is alert.  Vitals reviewed.  Lab Results  Component Value Date   WBC 8.9 09/08/2014   HGB 13.8 09/08/2014   HCT 41.1 09/08/2014   PLT 340 09/08/2014   GLUCOSE 97 02/21/2009   CHOL 195 02/21/2009   TRIG 49.0 02/21/2009   HDL 46.40 02/21/2009   LDLCALC 139* 02/21/2009   ALT 18 02/21/2009   AST 21 02/21/2009   NA 143 02/21/2009   K 4.1 02/21/2009   CL 105 02/21/2009   CREATININE 0.8 02/21/2009   BUN 11 02/21/2009   CO2 31 02/21/2009   TSH 1.227 09/08/2014   INR 1.1 10/29/2008    Assessment & Plan:   Problem List Items Addressed This Visit    Migraine without aura   Relevant Medications   amitriptyline (ELAVIL) 75 MG tablet    Other Visit Diagnoses    Acute maxillary sinusitis, recurrence not specified        Relevant Medications    levocetirizine (XYZAL) 5 MG tablet      Meds ordered this encounter  Medications  . DISCONTD: zolpidem (AMBIEN) 5 MG tablet    Sig: Take 5 mg by mouth at bedtime as needed.    Refill:  5  . amitriptyline (ELAVIL) 75 MG tablet    Sig: Take 1 tablet (75 mg total) by mouth at bedtime.    Dispense:  90 tablet    Refill:  3  . levocetirizine (XYZAL) 5 MG tablet    Sig: Take 1 tablet (5 mg total) by mouth every evening.    Dispense:  90 tablet    Refill:  6  . DISCONTD: levofloxacin (LEVAQUIN) 500 MG tablet    Sig: Take 1 tablet (500 mg total) by mouth daily.    Dispense:  7 tablet    Refill:  0    Follow-up: Return if symptoms worsen or fail to improve.  [place]

## 2015-01-30 NOTE — Progress Notes (Signed)
Pre visit review using our clinic review tool, if applicable. No additional management support is needed unless otherwise documented below in the visit note. 

## 2015-01-30 NOTE — Assessment & Plan Note (Signed)
Flu negative today.  Given symptoms and physical exam (in addition to fever), I am treating empirically for rhinosinusitis. Treating with Levaquin.

## 2015-01-30 NOTE — Patient Instructions (Signed)
It was nice to see you.  I am treating this like a sinus infection.  Take the antibiotic as prescribed.  Follow up as needed.  Take care  Dr. Lacinda Axon

## 2015-03-01 ENCOUNTER — Telehealth: Payer: Self-pay | Admitting: *Deleted

## 2015-03-01 ENCOUNTER — Encounter: Payer: Self-pay | Admitting: Family Medicine

## 2015-03-01 ENCOUNTER — Other Ambulatory Visit: Payer: Self-pay | Admitting: Family Medicine

## 2015-03-01 ENCOUNTER — Other Ambulatory Visit: Payer: Self-pay | Admitting: Obstetrics & Gynecology

## 2015-03-01 ENCOUNTER — Ambulatory Visit (INDEPENDENT_AMBULATORY_CARE_PROVIDER_SITE_OTHER): Payer: BLUE CROSS/BLUE SHIELD | Admitting: Family Medicine

## 2015-03-01 VITALS — BP 106/68 | HR 108 | Temp 98.2°F | Ht 65.5 in | Wt 181.8 lb

## 2015-03-01 DIAGNOSIS — J0101 Acute recurrent maxillary sinusitis: Secondary | ICD-10-CM | POA: Diagnosis not present

## 2015-03-01 DIAGNOSIS — R05 Cough: Secondary | ICD-10-CM

## 2015-03-01 DIAGNOSIS — G4700 Insomnia, unspecified: Secondary | ICD-10-CM

## 2015-03-01 DIAGNOSIS — R059 Cough, unspecified: Secondary | ICD-10-CM | POA: Insufficient documentation

## 2015-03-01 HISTORY — DX: Acute recurrent maxillary sinusitis: J01.01

## 2015-03-01 MED ORDER — HYDROCOD POLST-CPM POLST ER 10-8 MG/5ML PO SUER: 5.0000 mL | Freq: Two times a day (BID) | ORAL | Status: DC | PRN

## 2015-03-01 MED ORDER — LEVOFLOXACIN 500 MG PO TABS: 500.0000 mg | ORAL_TABLET | Freq: Every day | ORAL | Status: DC

## 2015-03-01 NOTE — Progress Notes (Signed)
Subjective:  Patient ID: [name], female    DOB: 12-28-1975  Age: 39 y.o. MRN: [id number]  CC: Cough, sinus pressure/pain  HPI:  39 year old female presents to clinic today for an acute visit with the above complaints.  Patient reports that she's been experiencing cough and significant sinus pressure/pain for the past 3 days. She reports associated fever (Tmax 102) and body aches. She has a known sick contact as her daughter has a similar illness. She took amitriptyline with no relief in her symptoms. No known exacerbating factors. Cough is "deep" and nonproductive. It is particularly troublesome at night. No complaints of SOB, chest pain.  Social Hx   Social History   Social History  . Marital Status: Married    Spouse Name: N/A  . Number of Children: N/A  . Years of Education: N/A   Social History Main Topics  . Smoking status: Never Smoker   . Smokeless tobacco: Never Used  . Alcohol Use: 0.6 oz/week    1 Glasses of wine per week     Comment: occassionally  . Drug Use: No  . Sexual Activity:    Partners: Male    Birth Control/ Protection: None   Other Topics Concern  . None   Social History Narrative   Review of Systems  Constitutional: Negative for fever.  HENT: Positive for ear pain and sinus pressure.   Respiratory: Positive for cough.    Objective:  BP 106/68 mmHg  Pulse 108  Temp(Src) 98.2 F (36.8 C) (Oral)  Ht 5' 5.5" (1.664 m)  Wt 181 lb 12 oz (82.441 kg)  BMI 29.77 kg/m2  SpO2 97%  BP/Weight 03/01/2015 01/30/2015 99991111  Systolic BP A999333 [id number] A999333  Diastolic BP 68 72 64  Wt. (Lbs) 181.75 178.25 176.75  BMI 29.77 29.2 28.95   Physical Exam  Constitutional: She is oriented to person, place, and time. She appears well-developed. No distress.  HENT:  Head: Normocephalic and atraumatic.  Mouth/Throat: Oropharynx is clear and moist. No oropharyngeal exudate.  Normal TMs bilaterally. Severe maxillary sinus tenderness bilaterally.    Neck: Neck supple.  Cardiovascular: Regular rhythm.  Tachycardia present.   Pulmonary/Chest: Effort normal and breath sounds normal. No respiratory distress. She has no wheezes. She has no rales.  Lymphadenopathy:    She has no cervical adenopathy.  Neurological: She is alert and oriented to person, place, and time.  Psychiatric: She has a normal mood and affect.  Vitals reviewed.  Lab Results  Component Value Date   WBC 8.9 09/08/2014   HGB 13.8 09/08/2014   HCT 41.1 09/08/2014   PLT 340 09/08/2014   GLUCOSE 97 02/21/2009   CHOL 195 02/21/2009   TRIG 49.0 02/21/2009   HDL 46.40 02/21/2009   LDLCALC 139* 02/21/2009   ALT 18 02/21/2009   AST 21 02/21/2009   NA 143 02/21/2009   K 4.1 02/21/2009   CL 105 02/21/2009   CREATININE 0.8 02/21/2009   BUN 11 02/21/2009   CO2 31 02/21/2009   TSH 1.227 09/08/2014   INR 1.1 10/29/2008    Assessment & Plan:   Problem List Items Addressed This Visit    Cough    New acute problem. Treating with Tussionex.      Acute recurrent maxillary sinusitis - Primary    Acute, recurrent. Treating with Levaquin.      Relevant Medications   levofloxacin (LEVAQUIN) 500 MG tablet   chlorpheniramine-HYDROcodone (TUSSIONEX PENNKINETIC ER) 10-8 MG/5ML SUER      Meds ordered  this encounter  Medications  . levofloxacin (LEVAQUIN) 500 MG tablet    Sig: Take 1 tablet (500 mg total) by mouth daily.    Dispense:  7 tablet    Refill:  0  . chlorpheniramine-HYDROcodone (TUSSIONEX PENNKINETIC ER) 10-8 MG/5ML SUER    Sig: Take 5 mLs by mouth every 12 (twelve) hours as needed.    Dispense:  115 mL    Refill:  0    Follow-up: PRN  [place]

## 2015-03-01 NOTE — Telephone Encounter (Signed)
Refill request sent to [facility].

## 2015-03-01 NOTE — Telephone Encounter (Signed)
[facility] [address] [facility] not receive her Rx for Lor[facility]epam. Patient requested a call when Rx is sent over to pharmacy.

## 2015-03-01 NOTE — Patient Instructions (Signed)
Take the antibiotic as prescribed.  Call if you worsen or fail to improve.  Happy new year,   Dr. Lacinda Axon

## 2015-03-01 NOTE — Assessment & Plan Note (Signed)
Acute, recurrent. Treating with Levaquin.

## 2015-03-01 NOTE — Progress Notes (Signed)
Pre visit review using our clinic review tool, if applicable. No additional management support is needed unless otherwise documented below in the visit note. 

## 2015-03-01 NOTE — Telephone Encounter (Signed)
Please advise 

## 2015-03-01 NOTE — Assessment & Plan Note (Addendum)
New acute problem. Treating with Tussionex.

## 2015-03-01 NOTE — Telephone Encounter (Signed)
Needs refill. Please advise?

## 2015-03-02 MED ORDER — LORAZEPAM 1 MG PO TABS: 1.0000 mg | ORAL_TABLET | Freq: Every evening | ORAL | Status: DC | PRN

## 2015-04-07 ENCOUNTER — Telehealth: Payer: Self-pay | Admitting: *Deleted

## 2015-04-07 DIAGNOSIS — G43809 Other migraine, not intractable, without status migrainosus: Secondary | ICD-10-CM

## 2015-04-07 NOTE — Telephone Encounter (Signed)
-----   Message from Francia Greaves sent at 04/07/2015 10:06 AM EST ----- Regarding: Referral  Contact: [phone] Wants a referral to see a neurologist for migraines, states they are related to menstrual cramps. Advised her that she should consider seeing Allie Dimmer first, she refused. Last appt with [place] was on 11/2014.

## 2015-04-07 NOTE — Telephone Encounter (Signed)
Referral ordered

## 2015-04-13 ENCOUNTER — Encounter: Payer: Self-pay | Admitting: Family Medicine

## 2015-04-13 ENCOUNTER — Ambulatory Visit (INDEPENDENT_AMBULATORY_CARE_PROVIDER_SITE_OTHER): Payer: PRIVATE HEALTH INSURANCE | Admitting: Family Medicine

## 2015-04-13 VITALS — BP 116/82 | HR 110 | Temp 98.5°F | Ht 65.5 in | Wt 177.1 lb

## 2015-04-13 DIAGNOSIS — J0101 Acute recurrent maxillary sinusitis: Secondary | ICD-10-CM | POA: Diagnosis not present

## 2015-04-13 DIAGNOSIS — G43009 Migraine without aura, not intractable, without status migrainosus: Secondary | ICD-10-CM | POA: Diagnosis not present

## 2015-04-13 MED ORDER — PROMETHAZINE HCL 25 MG/ML IJ SOLN
25.0000 mg | Freq: Once | INTRAMUSCULAR | Status: AC
  Administered 2015-04-13: 25 mg via INTRAMUSCULAR

## 2015-04-13 MED ORDER — KETOROLAC TROMETHAMINE 60 MG/2ML IM SOLN
60.0000 mg | Freq: Once | INTRAMUSCULAR | Status: AC
  Administered 2015-04-13: 60 mg via INTRAMUSCULAR

## 2015-04-13 MED ORDER — PROMETHAZINE HCL 25 MG PO TABS: 25.0000 mg | ORAL_TABLET | Freq: Three times a day (TID) | ORAL | Status: DC | PRN

## 2015-04-13 MED ORDER — DOXYCYCLINE HYCLATE 100 MG PO TABS: 100.0000 mg | ORAL_TABLET | Freq: Two times a day (BID) | ORAL | Status: DC

## 2015-04-13 NOTE — Patient Instructions (Signed)
Take the antibiotic if you worsen.  We will be in touch regarding your referral.  I have refilled your phenergan.  Take care  Dr. Lacinda Axon

## 2015-04-13 NOTE — Assessment & Plan Note (Signed)
Established problem, worsening. Doubt bacterial source. Advised supportive care; Doxycycline to be filled if worsens. Sending to ENT as she has had 2 prior occurrences of this since Nov.

## 2015-04-13 NOTE — Progress Notes (Signed)
Subjective:  Patient ID: [name], female    DOB: May 11, 1975  Age: 40 y.o. MRN: [id number]  CC: Sinus pressure, congestion, fever; Migraine headache.   HPI:  40 year old female presents with the above complaints.   Sinus pressure/congestion  Began 1 week ago.  She had a fever of 102 earlier this week on Tues.  Symptoms are severe.  She has had recent sick contacts at work.  No relieving factors.  Symptoms have worsened migraine.  Migraine   Patient is currently having a severe migraine.  + photophobia, nausea, vomiting.   In need of refill on phenergan (works for her migraines).  Social Hx   Social History   Social History  . Marital Status: Married    Spouse Name: N/A  . Number of Children: N/A  . Years of Education: N/A   Social History Main Topics  . Smoking status: Never Smoker   . Smokeless tobacco: Never Used  . Alcohol Use: 0.6 oz/week    1 Glasses of wine per week     Comment: occassionally  . Drug Use: No  . Sexual Activity:    Partners: Male    Birth Control/ Protection: None   Other Topics Concern  . None   Social History Narrative   Review of Systems  Constitutional: Positive for fever.  HENT: Positive for congestion and sinus pressure.   Neurological:       Migraine.   Objective:  BP 116/82 mmHg  Pulse 110  Temp(Src) 98.5 F (36.9 C) (Oral)  Ht 5' 5.5" (1.664 m)  Wt 177 lb 2 oz (80.343 kg)  BMI 29.02 kg/m2  SpO2 98%  BP/Weight 04/13/2015 03/01/2015 [id number]  Systolic BP 99991111 A999333 123456  Diastolic BP 82 68 72  Wt. (Lbs) 177.13 181.75 178.25  BMI 29.02 29.77 29.2   Physical Exam  Constitutional: She is oriented to person, place, and time. She appears well-developed.  Appears in pain.  HENT:  Head: Normocephalic and atraumatic.  Mouth/Throat: No oropharyngeal exudate.  Normal TM's. Severe maxillary sinus tenderness.   Neck: Neck supple.  Cardiovascular: Normal rate and regular rhythm.   Pulmonary/Chest: Effort  normal and breath sounds normal.  Neurological: She is alert and oriented to person, place, and time.  + Photophobia.  Vitals reviewed.  Lab Results  Component Value Date   WBC 8.9 09/08/2014   HGB 13.8 09/08/2014   HCT 41.1 09/08/2014   PLT 340 09/08/2014   GLUCOSE 97 02/21/2009   CHOL 195 02/21/2009   TRIG 49.0 02/21/2009   HDL 46.40 02/21/2009   LDLCALC 139* 02/21/2009   ALT 18 02/21/2009   AST 21 02/21/2009   NA 143 02/21/2009   K 4.1 02/21/2009   CL 105 02/21/2009   CREATININE 0.8 02/21/2009   BUN 11 02/21/2009   CO2 31 02/21/2009   TSH 1.227 09/08/2014   INR 1.1 10/29/2008    Assessment & Plan:   Problem List Items Addressed This Visit    Migraine without aura    Established problem, acute exacerbation. IM Toradol and Phenergan today. Home PO Phenergan refilled.       Relevant Medications   promethazine (PHENERGAN) 25 MG tablet   ketorolac (TORADOL) injection 60 mg (Completed)   promethazine (PHENERGAN) injection 25 mg (Completed)   Acute recurrent maxillary sinusitis - Primary    Established problem, worsening. Doubt bacterial source. Advised supportive care; Doxycycline to be filled if worsens. Sending to ENT as she has had 2 prior occurrences of this  since Nov.      Relevant Medications   doxycycline (VIBRA-TABS) 100 MG tablet   promethazine (PHENERGAN) 25 MG tablet   promethazine (PHENERGAN) injection 25 mg (Completed)   Other Relevant Orders   Ambulatory referral to ENT      Meds ordered this encounter  Medications  . doxycycline (VIBRA-TABS) 100 MG tablet    Sig: Take 1 tablet (100 mg total) by mouth 2 (two) times daily.    Dispense:  20 tablet    Refill:  0  . promethazine (PHENERGAN) 25 MG tablet    Sig: Take 1 tablet (25 mg total) by mouth every 8 (eight) hours as needed.    Dispense:  30 tablet    Refill:  0  . ketorolac (TORADOL) injection 60 mg    Sig:   . promethazine (PHENERGAN) injection 25 mg    Sig:     Follow-up:  PRN  [place]

## 2015-04-13 NOTE — Assessment & Plan Note (Signed)
Established problem, acute exacerbation. IM Toradol and Phenergan today. Home PO Phenergan refilled.

## 2015-04-13 NOTE — Progress Notes (Signed)
Pre visit review using our clinic review tool, if applicable. No additional management support is needed unless otherwise documented below in the visit note. 

## 2015-04-17 ENCOUNTER — Other Ambulatory Visit: Payer: Self-pay | Admitting: Family Medicine

## 2015-04-17 ENCOUNTER — Telehealth: Payer: Self-pay

## 2015-04-17 ENCOUNTER — Telehealth: Payer: Self-pay | Admitting: Family Medicine

## 2015-04-17 ENCOUNTER — Other Ambulatory Visit: Payer: Self-pay

## 2015-04-17 DIAGNOSIS — G43709 Chronic migraine without aura, not intractable, without status migrainosus: Secondary | ICD-10-CM

## 2015-04-17 MED ORDER — SUMATRIPTAN SUCCINATE 50 MG PO TABS: 50.0000 mg | ORAL_TABLET | ORAL | Status: DC | PRN

## 2015-04-17 NOTE — Telephone Encounter (Signed)
Patient called and left a message on our voice mail stating she would like a refill on Ativan sent over to [facility] [address] Damaris Schooner with [facility], [place]. She advised me that she was not going to fill this and the patient needed to be seen by our office. Called patient to let her know, no answer, left a voice mail advising her to call the office to schedule an appt.

## 2015-04-17 NOTE — Telephone Encounter (Signed)
She used elavil no longer working.  She as zomeg. Imitrex she is okay with getting.

## 2015-04-17 NOTE — Telephone Encounter (Signed)
Please advise 

## 2015-04-17 NOTE — Telephone Encounter (Signed)
Pt would like a referral to [facility] in [facility] Fax number is [phone]. Due to having migraines and the root cause of why she getting them to frequently. Call pt @ [phone]. Thank you!

## 2015-04-17 NOTE — Telephone Encounter (Signed)
Patient left a voice mail stating that she was seen last week for a cold and migraine . She was given Toradol and phenergan. She has taken the phenergan this morning ,but she is needing something for her migraine . She is asking for a phone call from the nurse or a prescription called to the pharmacy.

## 2015-04-17 NOTE — Telephone Encounter (Signed)
Will place referral. Other than phenergan what has worked in the past? We can try imitrex (or another triptan). She may need to be seen in the ED if no relief.

## 2015-05-10 ENCOUNTER — Encounter: Payer: Self-pay | Admitting: Family Medicine

## 2015-05-22 ENCOUNTER — Ambulatory Visit: Payer: PRIVATE HEALTH INSURANCE | Admitting: Obstetrics & Gynecology

## 2015-05-25 ENCOUNTER — Telehealth: Payer: Self-pay

## 2015-05-25 NOTE — Telephone Encounter (Signed)
Patient had requested a call if any earlier appt times became available for Dr. Harolyn Rutherford, had a cancellation for 05/26/2015 @1045 , called  Patient to notify her of this, no answer, left voicemail, instructing her to return my call here at the office if she would like me to change her appt time

## 2015-05-25 NOTE — Telephone Encounter (Signed)
Second attempt to try and reach patient for an earlier appt time

## 2015-05-29 ENCOUNTER — Ambulatory Visit (INDEPENDENT_AMBULATORY_CARE_PROVIDER_SITE_OTHER): Payer: PRIVATE HEALTH INSURANCE | Admitting: Obstetrics & Gynecology

## 2015-05-29 VITALS — BP 115/77 | HR 91 | Ht 65.5 in | Wt 176.0 lb

## 2015-05-29 DIAGNOSIS — N83201 Unspecified ovarian cyst, right side: Secondary | ICD-10-CM | POA: Diagnosis not present

## 2015-05-29 DIAGNOSIS — G4700 Insomnia, unspecified: Secondary | ICD-10-CM

## 2015-05-29 MED ORDER — LEVONORGEST-ETH ESTRAD 91-DAY 0.15-0.03 MG PO TABS: 1.0000 | ORAL_TABLET | Freq: Every day | ORAL | Status: DC

## 2015-05-29 MED ORDER — TRAMADOL HCL 50 MG PO TABS: 50.0000 mg | ORAL_TABLET | Freq: Four times a day (QID) | ORAL | Status: DC | PRN

## 2015-05-29 MED ORDER — ZOLPIDEM TARTRATE 10 MG PO TABS: 10.0000 mg | ORAL_TABLET | Freq: Every evening | ORAL | Status: DC | PRN

## 2015-05-29 NOTE — Patient Instructions (Signed)

## 2015-05-29 NOTE — Progress Notes (Signed)
     CLINIC ENCOUNTER NOTE  History:  40 y.o. G1P1001 here today for seven days of severe pain especially on her right side.  History of recurrent right ovarian physiologic cysts.  Pain starts prior to menstrual cycle and continues thru cycle. Ibuprofen/Naproxen does not help and causes hands to swell, rash on face (reactions noted under Allergy). She denies any abnormal vaginal discharge, bleeding or other concerns.  Desires refill of Ambien for chronic insomnia.  Past Medical History  Diagnosis Date  . Anxiety   . Migraine headache   . Ovarian cyst 10-2014    right  . Chicken pox     Past Surgical History  Procedure Laterality Date  . Tonsillectomy  1980    The following portions of the patient's history were reviewed and updated as appropriate: allergies, current medications, past family history, past medical history, past social history, past surgical history and problem list.   Health Maintenance:  Normal pap on 10/06/2013.    Review of Systems:  Pertinent items noted in HPI and remainder of comprehensive ROS otherwise negative.  Objective:  Physical Exam BP 115/77 mmHg  Pulse 91  Ht 5' 5.5" (1.664 m)  Wt 176 lb (79.833 kg)  BMI 28.83 kg/m2  LMP 05/23/2015 CONSTITUTIONAL: Well-developed, well-nourished female in no acute distress.  HENT:  Normocephalic, atraumatic. External right and left ear normal. Oropharynx is clear and moist EYES: Conjunctivae and EOM are normal. Pupils are equal, round, and reactive to light. No scleral icterus.  NECK: Normal range of motion, supple, no masses SKIN: Skin is warm and dry. No rash noted. Not diaphoretic. No erythema. No pallor. NEUROLOGIC: Alert and oriented to person, place, and time. Normal reflexes, muscle tone coordination. No cranial nerve deficit noted. PSYCHIATRIC: Normal mood and affect. Normal behavior. Normal judgment and thought content. CARDIOVASCULAR: Normal heart rate noted RESPIRATORY: Effort and breath sounds normal,  no problems with respiration noted ABDOMEN: Soft, no distention noted.   PELVIC: Deferred MUSCULOSKELETAL: Normal range of motion. No edema noted.   Assessment & Plan:  1. Cyst of right ovary 2. Dysmenorrhea Given recurrent cysts and dysmenorrhea, recommended menstrual suppression.  Will do trial of OCPs, active pills continuously.  Recheck ultrasound.  - US Pelvis Complete; Future - US Transvaginal Non-OB; Future - levonorgestrel-ethinyl estradiol (SEASONALE,INTROVALE,JOLESSA) 0.15-0.03 MG tablet; Take 1 tablet by mouth daily.  Dispense: 1 Package; Refill: 4 - traMADol (ULTRAM) 50 MG tablet; Take 1 tablet (50 mg total) by mouth every 6 (six) hours as needed for severe pain.  Dispense: 60 tablet; Refill: 5  3. Insomnia Has seen Sleep Specialist, will continue Ambien for now.  - zolpidem (AMBIEN) 10 MG tablet; Take 1 tablet (10 mg total) by mouth at bedtime as needed for sleep.  Dispense: 30 tablet; Refill: 5   Routine preventative health maintenance measures emphasized. Please refer to After Visit Summary for other counseling recommendations.   Return in about 2 months (around 07/29/2015) for OCP and BP check.   Total face-to-face time with patient: 20 minutes. Over 50% of encounter was spent on counseling and coordination of care.   Verita Schneiders, MD, [place] Attending Obstetrician & Gynecologist, [place] [facility]

## 2015-06-06 ENCOUNTER — Ambulatory Visit: Payer: PRIVATE HEALTH INSURANCE

## 2015-06-22 ENCOUNTER — Telehealth: Payer: Self-pay | Admitting: *Deleted

## 2015-06-22 NOTE — Telephone Encounter (Signed)
Pt called the office stating she was experiencing severe lower abdominal pain like she has been experiencing in the past but has now worsened over the past few days.  Has been using the Tramadol and is having little relief, requesting different pain medication to be called into pharmacy.  Pt had an [place] appt scheduled for 05-29-15 but missed the appt.  Gave pt the number to [place] to reschedule the appt.  Encouraged her to use heating pad on her abdomen to see if that helps as well. Will inbox Dr Harolyn Rutherford for further management.

## 2015-06-23 ENCOUNTER — Ambulatory Visit
Admission: RE | Admit: 2015-06-23 | Discharge: 2015-06-23 | Disposition: A | Discharge status: Home / Self Care | Payer: BLUE CROSS/BLUE SHIELD | Source: Ambulatory Visit | Attending: Obstetrics & Gynecology | Admitting: Obstetrics & Gynecology

## 2015-06-23 DIAGNOSIS — N83201 Unspecified ovarian cyst, right side: Secondary | ICD-10-CM | POA: Insufficient documentation

## 2015-06-23 NOTE — Telephone Encounter (Signed)
Called pt to adv Dr. Harolyn Rutherford states tramadol and OCP should take care of pain and if it doesn't then she would need [place] to determine what the problem is before prescribing any further medications. Advised pt to report to [facility] if pain is as bad as she says and unable to go to work. Pt states she will have her husband take her to [facility] to be seen in [facility].

## 2015-06-26 ENCOUNTER — Ambulatory Visit: Payer: PRIVATE HEALTH INSURANCE

## 2015-06-26 ENCOUNTER — Telehealth: Payer: Self-pay | Admitting: *Deleted

## 2015-06-26 NOTE — Telephone Encounter (Signed)
-----   Message from Osborne Oman, MD sent at 06/23/2015  4:53 PM EDT ----- Normal pelvic ultrasound, no etiology seen for her pain. No further medications are indicated.  Please call to inform patient of results.

## 2015-06-26 NOTE — Telephone Encounter (Signed)
Called pt, no answer, left message to call the office.  

## 2015-06-28 ENCOUNTER — Telehealth: Payer: Self-pay | Admitting: *Deleted

## 2015-06-28 NOTE — Telephone Encounter (Signed)
-----   Message from Osborne Oman, MD sent at 06/23/2015  4:53 PM EDT ----- Normal pelvic ultrasound, no etiology seen for her pain. No further medications are indicated.  Please call to inform patient of results.

## 2015-06-28 NOTE — Telephone Encounter (Signed)
Informed pt of normal pelvic US result, pt states pain is better at this time.  She stopped taking the Tramadol and believes it may have been related to GI disturbances that were related to taking the Tramadol.  Pt also states she started the birth control pill 3 days ago and had a few questions about nausea related to that.  Informed pt to take at night with crackers before bed and give it a few weeks to adjust.  Pt will call office if pain returns and will need to schedule appt with physician at that time if that was to occur.  Pt acknowledged instructions.

## 2015-07-12 ENCOUNTER — Telehealth: Payer: Self-pay | Admitting: *Deleted

## 2015-07-12 NOTE — Telephone Encounter (Signed)
Pt just started the Seasonale on 06-25-15, states she had a cycle around that time and has been experiencing bleeding again for the past 7 days.  Informed pt that it is normal to have irregular bleeding the first few months after starting a birth control pill and to give it some more time.  If the bleeding continues to be a problem after the next two months to give [place] a call back and we will re-evaluate her birth control method.  Pt acknowledged instructions.

## 2015-07-12 NOTE — Telephone Encounter (Signed)
-----   Message from [name] sent at 07/12/2015 10:19 AM EDT ----- Regarding: OCP/AUB Contact: [phone] Wants to talk to someone about her OCP and her cycles, she was under the impression that she wouldn't have them anymore

## 2015-07-12 NOTE — Telephone Encounter (Signed)
-----   Message from Francia Greaves sent at 07/12/2015 10:19 AM EDT ----- Regarding: OCP/AUB Contact: [phone] Wants to talk to someone about her OCP and her cycles, she was under the impression that she wouldn't have them anymore

## 2015-07-12 NOTE — Telephone Encounter (Signed)
Called pt, no answer, left message to call the office.  

## 2015-07-25 ENCOUNTER — Ambulatory Visit: Payer: PRIVATE HEALTH INSURANCE | Admitting: Obstetrics & Gynecology

## 2015-07-25 DIAGNOSIS — Z3041 Encounter for surveillance of contraceptive pills: Secondary | ICD-10-CM

## 2015-10-10 ENCOUNTER — Telehealth: Payer: Self-pay | Admitting: *Deleted

## 2015-10-10 DIAGNOSIS — N946 Dysmenorrhea, unspecified: Secondary | ICD-10-CM

## 2015-10-10 MED ORDER — NORGESTIMATE-ETH ESTRADIOL 0.25-35 MG-MCG PO TABS: 1.0000 | ORAL_TABLET | Freq: Every day | ORAL | 4 refills | Status: DC

## 2015-10-10 NOTE — Telephone Encounter (Signed)
Spoke to pt, was taking Seasonale for the past few months, states that pain is better and more tolerable and she is using less Toradol but she is having irregular bleeding during the 3 month time period.  Spoke to Dr Harolyn Rutherford about pt case and stated that pt could try Sprintec and she if that would help her bleeding profile.  Informed pt of Dr Harolyn Rutherford recommendation.  Pt would like to try the [place].  Called rx to pharmacy and instructed on use.

## 2015-10-16 ENCOUNTER — Encounter: Payer: Self-pay | Admitting: *Deleted

## 2015-11-07 ENCOUNTER — Telehealth: Payer: Self-pay | Admitting: *Deleted

## 2015-11-07 DIAGNOSIS — B373 Candidiasis of vulva and vagina: Secondary | ICD-10-CM

## 2015-11-07 DIAGNOSIS — B3731 Acute candidiasis of vulva and vagina: Secondary | ICD-10-CM

## 2015-11-07 MED ORDER — FLUCONAZOLE 150 MG PO TABS: 150.0000 mg | ORAL_TABLET | ORAL | 3 refills | Status: DC

## 2015-11-07 NOTE — Telephone Encounter (Signed)
Sent Rx to pharmacy  

## 2015-11-07 NOTE — Telephone Encounter (Signed)
Spoke to pt about symptoms. She had gotten Monistat over the counter which seemed to make yeast infection worse. Had already sent Rx to pharmacy. Pt expressed understanding.

## 2015-11-07 NOTE — Telephone Encounter (Signed)
-----   Message from Francia Greaves sent at 11/03/2015 10:16 AM EDT ----- Regarding: Rx Request Contact: [phone] Thinks she has a yeast infection  [facility] [address]

## 2016-01-11 ENCOUNTER — Encounter: Payer: Self-pay | Admitting: Medical Oncology

## 2016-01-11 ENCOUNTER — Emergency Department
Admission: EM | Admit: 2016-01-11 | Discharge: 2016-01-11 | Disposition: A | Discharge status: Home / Self Care | Payer: BLUE CROSS/BLUE SHIELD | Attending: Emergency Medicine | Admitting: Emergency Medicine

## 2016-01-11 DIAGNOSIS — Z79899 Other long term (current) drug therapy: Secondary | ICD-10-CM | POA: Diagnosis not present

## 2016-01-11 DIAGNOSIS — R05 Cough: Secondary | ICD-10-CM | POA: Diagnosis present

## 2016-01-11 DIAGNOSIS — Z791 Long term (current) use of non-steroidal anti-inflammatories (NSAID): Secondary | ICD-10-CM | POA: Diagnosis not present

## 2016-01-11 DIAGNOSIS — J0100 Acute maxillary sinusitis, unspecified: Secondary | ICD-10-CM | POA: Diagnosis not present

## 2016-01-11 MED ORDER — ONDANSETRON 4 MG PO TBDP
4.0000 mg | ORAL_TABLET | Freq: Once | ORAL | Status: AC
  Administered 2016-01-11: 4 mg via ORAL
  Filled 2016-01-11: qty 1

## 2016-01-11 MED ORDER — SULFAMETHOXAZOLE-TRIMETHOPRIM 800-160 MG PO TABS: 1.0000 | ORAL_TABLET | Freq: Two times a day (BID) | ORAL | 0 refills | Status: DC

## 2016-01-11 MED ORDER — PSEUDOEPH-BROMPHEN-DM 30-2-10 MG/5ML PO SYRP: 5.0000 mL | ORAL_SOLUTION | Freq: Four times a day (QID) | ORAL | 0 refills | Status: DC | PRN

## 2016-01-11 MED ORDER — BENZONATATE 100 MG PO CAPS: 200.0000 mg | ORAL_CAPSULE | Freq: Three times a day (TID) | ORAL | 0 refills | Status: DC | PRN

## 2016-01-11 MED ORDER — FEXOFENADINE-PSEUDOEPHED ER 60-120 MG PO TB12: 1.0000 | ORAL_TABLET | Freq: Two times a day (BID) | ORAL | 0 refills | Status: DC

## 2016-01-11 MED ORDER — OXYCODONE-ACETAMINOPHEN 5-325 MG PO TABS
1.0000 | ORAL_TABLET | Freq: Once | ORAL | Status: AC
  Administered 2016-01-11: 1 via ORAL
  Filled 2016-01-11: qty 1

## 2016-01-11 NOTE — ED Provider Notes (Signed)
[facility] Emergency Department Provider Note   ____________________________________________   First MD Initiated Contact with Patient 01/11/16 1354     (approximate)  I have reviewed the triage vital signs and the nursing notes.   HISTORY  Chief Complaint URI and Cough    HPI [name] is a 40 y.o. female patient complain of 1 week of sinus pressure, frontal headache, ear pressure and a nonproductive cough. She states intermittent nausea but no vomiting or diarrhea. No palliative measures taken for this complaint. Patient rates the pain as a 9/10 and describes it as pain as "pressure".   Past Medical History:  Diagnosis Date  . Anxiety   . Chicken pox   . Migraine headache   . Ovarian cyst 10-2014   right    Patient Active Problem List   Diagnosis Date Noted  . Acute recurrent maxillary sinusitis 03/01/2015  . Anxiety 01/09/2015  . Arthralgia of temporomandibular joint 12/19/2013  . Acne 10/06/2013  . AD (atopic dermatitis) 06/23/2013  . Allergic rhinitis 06/10/2013  . Acid reflux 05/11/2012  . Migraine without aura 09/25/2010  . Insomnia 09/25/2010    Past Surgical History:  Procedure Laterality Date  . TONSILLECTOMY  1980    Prior to Admission medications   Medication Sig Start Date End Date Taking? Authorizing Provider  benzonatate (TESSALON PERLES) 100 MG capsule Take 2 capsules (200 mg total) by mouth 3 (three) times daily as needed for cough. 01/11/16   Sable Feil, PA-C  fexofenadine-pseudoephedrine (ALLEGRA-D) 60-120 MG 12 hr tablet Take 1 tablet by mouth 2 (two) times daily. 01/11/16   Sable Feil, PA-C  fluconazole (DIFLUCAN) 150 MG tablet Take 1 tablet (150 mg total) by mouth every 3 (three) days. For three doses 11/07/15   Donnamae Jude, MD  ibuprofen (ADVIL,MOTRIN) 800 MG tablet Take 1 tablet (800 mg total) by mouth 3 (three) times daily with meals as needed for headache or moderate pain. 11/21/14   Osborne Oman, MD  levonorgestrel-ethinyl estradiol (SEASONALE,INTROVALE,JOLESSA) 0.15-0.03 MG tablet Take 1 tablet by mouth daily. 05/29/15   Osborne Oman, MD  LORazepam (ATIVAN) 1 MG tablet Take 1 tablet (1 mg total) by mouth at bedtime as needed for anxiety. 03/02/15   Coral Spikes, DO  norgestimate-ethinyl estradiol (ORTHO-CYCLEN,SPRINTEC,PREVIFEM) 0.25-35 MG-MCG tablet Take 1 tablet by mouth daily. 10/10/15   Osborne Oman, MD  promethazine (PHENERGAN) 25 MG tablet Take 1 tablet (25 mg total) by mouth every 8 (eight) hours as needed. 04/13/15   Coral Spikes, DO  sulfamethoxazole-trimethoprim (BACTRIM DS,SEPTRA DS) 800-160 MG tablet Take 1 tablet by mouth 2 (two) times daily. 01/11/16   Sable Feil, PA-C  SUMAtriptan (IMITREX) 50 MG tablet Take 1 tablet (50 mg total) by mouth every 2 (two) hours as needed for migraine. May repeat dosing once in 2 hours. 04/17/15   Coral Spikes, DO  traMADol (ULTRAM) 50 MG tablet Take 1 tablet (50 mg total) by mouth every 6 (six) hours as needed for severe pain. 05/29/15   Osborne Oman, MD  zolpidem (AMBIEN) 10 MG tablet Take 1 tablet (10 mg total) by mouth at bedtime as needed for sleep. 05/29/15   Osborne Oman, MD    Allergies Penicillins; Naproxen; Tramadol; and Ibuprofen  Family History  Problem Relation Age of Onset  . Hyperlipidemia Mother   . Heart disease Father   . Diabetes Father   . COPD Father   . Arthritis Maternal Aunt  Social History Social History  Substance Use Topics  . Smoking status: Never Smoker  . Smokeless tobacco: Never Used  . Alcohol use 0.6 oz/week    1 Glasses of wine per week     Comment: occassionally    Review of Systems Constitutional: No fever/chills Eyes: No visual changes. [id number] and ear pressure Cardiovascular: Denies chest pain. Respiratory: Denies shortness of breath. Nonproductive cough Gastrointestinal: No abdominal pain.  No nausea, no vomiting.  No diarrhea.  No constipation. Genitourinary:  Negative for dysuria. Musculoskeletal: Negative for back pain. Skin: Negative for rash. Neurological: Negative for headaches, focal weakness or numbness. Psychiatric:Anxiety  Allergic/Immunilogical: See medication list _______________________________________   PHYSICAL EXAM:  VITAL SIGNS: ED Triage Vitals  Enc Vitals Group     BP 01/11/16 1337 106/76     Pulse Rate 01/11/16 1337 (!) 102     Resp 01/11/16 1337 18     Temp 01/11/16 1337 98.2 F (36.8 C)     Temp Source 01/11/16 1337 Oral     SpO2 01/11/16 1337 99 %     Weight 01/11/16 1337 164 lb (74.4 kg)     Height 01/11/16 1337 5\' 5"  (1.651 m)     Head Circumference --      Peak Flow --      Pain Score 01/11/16 1340 7     Pain Loc --      Pain Edu? --      Excl. in [place]? --     Constitutional: Alert and oriented. Well appearing and in no acute distress. Eyes: Conjunctivae are normal. PERRL. EOMI. Head: Atraumatic. Nose: Bilateral maxillary and frontal sinus guarding. Edematous bilateral nasal turbinates. Take nasal discharge. Mouth/Throat: Mucous membranes are moist.  Oropharynx non-erythematous. Copious postnasal drainage. Neck: No stridor.  No cervical spine tenderness to palpation. Hematological/Lymphatic/Immunilogical: No cervical lymphadenopathy. Cardiovascular: Normal rate, regular rhythm. Grossly normal heart sounds.  Good peripheral circulation. Respiratory: Normal respiratory effort.  No retractions. Lungs CTAB. Productive cough. Gastrointestinal: Soft and nontender. No distention. No abdominal bruits. No CVA tenderness. Musculoskeletal: No lower extremity tenderness nor edema.  No joint effusions. Neurologic:  Normal speech and language. No gross focal neurologic deficits are appreciated. No gait instability. Skin:  Skin is warm, dry and intact. No rash noted. Psychiatric: Mood and affect are normal. Speech and behavior are normal.  ____________________________________________   LABS (all labs ordered are  listed, but only abnormal results are displayed)  Labs Reviewed - No data to display ____________________________________________  EKG   ____________________________________________  RADIOLOGY  ____________________________________________   PROCEDURES  Procedure(s) performed: None  Procedures  Critical Care performed: No  ____________________________________________   INITIAL IMPRESSION / ASSESSMENT AND PLAN / ED COURSE  Pertinent labs & imaging results that were available during my care of the patient were reviewed by me and considered in my medical decision making (see chart for details).  Maxillary sinusitis. Patient given discharge care instructions. Patient given a prescription for amoxicillin, Allegra-D, And Tessalon Perles,  Clinical Course      ____________________________________________   FINAL CLINICAL IMPRESSION(S) / ED DIAGNOSES  Final diagnoses:  Subacute maxillary sinusitis      NEW MEDICATIONS STARTED DURING THIS VISIT:  New Prescriptions   BENZONATATE (TESSALON PERLES) 100 MG CAPSULE    Take 2 capsules (200 mg total) by mouth 3 (three) times daily as needed for cough.   FEXOFENADINE-PSEUDOEPHEDRINE (ALLEGRA-D) 60-120 MG 12 HR TABLET    Take 1 tablet by mouth 2 (two) times daily.   SULFAMETHOXAZOLE-TRIMETHOPRIM (BACTRIM  DS,SEPTRA DS) 800-160 MG TABLET    Take 1 tablet by mouth 2 (two) times daily.     Note:  This document was prepared using Dragon voice recognition software and may include unintentional dictation errors.    Sable Feil, PA-C 01/11/16 [place], MD 01/11/16 [phone]

## 2016-01-11 NOTE — ED Triage Notes (Signed)
Sinus pressure, cough and cold sx's x 2 days.

## 2016-02-21 ENCOUNTER — Telehealth: Payer: Self-pay | Admitting: *Deleted

## 2016-02-21 DIAGNOSIS — N946 Dysmenorrhea, unspecified: Secondary | ICD-10-CM

## 2016-02-21 MED ORDER — NORGESTIMATE-ETH ESTRADIOL 0.25-35 MG-MCG PO TABS: 1.0000 | ORAL_TABLET | Freq: Every day | ORAL | 4 refills | Status: DC

## 2016-02-21 NOTE — Telephone Encounter (Signed)
Spoke to pt, is currently taking the Sprintec OCPs and states that it has helped some with her pain associated with her menstrual cycles but she started her period a few days ago and she is experiencing severe cramps that have left her in the bed and unable to do activities of daily living.  Informed pt that it seems her periods are associated with the pain and if we stop her periods the pain will hopefully diminish.  Instructed to start taking the Sprintec continuously where she will skip the placebos and start a new pack right away.  Pt was requesting something for pain, had a bad reaction with Tramadol, Ibuprofen, and Naproxen.  Will contact Dr Harolyn Rutherford to see if she can send her in something temporarily to help with the pain.  Pt is to start a new OCP pack on Sunday and will start to take them continuously.  Has an appt scheduled for a follow-up with Dr Harolyn Rutherford on 03-12-15.

## 2016-02-21 NOTE — Telephone Encounter (Signed)
-----   Message from Francia Greaves sent at 02/20/2016 11:13 AM EST ----- Regarding: Advsie Wants to talk to you about her pelvic pain  Has an appointment schedule for 03/12/2015 w/ Any[facility]

## 2016-02-22 NOTE — Telephone Encounter (Signed)
Informed [name] of Dr Harolyn Rutherford response to request, [name] has tried Tylenol and there is minimal relief, states she does not know if she can wait until 03-11-16 to be seen due to the discomfort.  Scheduled evaluation with Dr Ilda Basset on 02-22-16.

## 2016-02-22 NOTE — Telephone Encounter (Signed)
No other prescription for pain at this point; can take Tylenol.  I am not going to prescribe any stronger narcotic. Can take OCPs continuously as instructed.

## 2016-02-23 ENCOUNTER — Ambulatory Visit (INDEPENDENT_AMBULATORY_CARE_PROVIDER_SITE_OTHER): Payer: PRIVATE HEALTH INSURANCE | Admitting: Obstetrics and Gynecology

## 2016-02-23 ENCOUNTER — Encounter: Payer: Self-pay | Admitting: Obstetrics and Gynecology

## 2016-02-23 VITALS — BP 112/71 | HR 112 | Resp 18 | Ht 65.5 in | Wt 171.0 lb

## 2016-02-23 DIAGNOSIS — N946 Dysmenorrhea, unspecified: Secondary | ICD-10-CM

## 2016-02-23 NOTE — Progress Notes (Signed)
Obstetrics and Gynecology Visit Work In Patient Evaluation  Appointment Date: 02/23/2016  OBGYN Clinic: [facility] for [facility] [place]  Primary Care Provider: Coral Pearson  Chief Complaint:  Chief Complaint  Patient presents with  . Pelvic Pain    History of Present Illness: [name] is a 40 y.o. African-American G1P1001 (Patient's last menstrual period was 02/18/2016.), seen for the above chief complaint.   Patient with right sided cramping that was intense that started last Friday. These are similar to what she has qmonth that precedes her periods and continues during her period. She is being followed by Dr. Harolyn Pearson and has follow up with her in early January but couldn't get in to see her before then for this acute event.  She is on cyclic sprintec and is having bleeding, as normal, on her placebo week, currently. She states the pain most recently was more intense then what brought her into the ER last year when she was evaluated by Dr. Gala Pearson in 2016.    She denies any pain outside of her periods.  She states it's better today and she denies any current nausea, vomiting, fevers, chills, chest pain, sob, vaginal discharge or itching, diarrhea, constipation, blood in BMs or UTI s/s. She denies any aura history   Review of Systems: as noted in the History of Present Illness.  Past Medical History:  Past Medical History:  Diagnosis Date  . Anxiety   . Chicken pox   . Migraine headache   . Ovarian cyst 10-2014   right    Past Surgical History:  Past Surgical History:  Procedure Laterality Date  . TONSILLECTOMY  1980    Past Obstetrical History:  OB History  Gravida Para Term Preterm AB Living  1 1 1     1   SAB TAB Ectopic Multiple Live Births          1    # Outcome Date GA Lbr Len/2nd Weight Sex Delivery Anes PTL Lv  1 Term 01/25/09 [redacted]w[redacted]d  6 lb 3 oz (2.807 kg) F Vag-Spont Spinal N LIV      Past Gynecological History: As per HPI. Pap negative 2015.   Social  History:  Social History   Social History  . Marital status: Married    Spouse name: N/A  . Number of children: N/A  . Years of education: N/A   Occupational History  . Not on file.   Social History Main Topics  . Smoking status: Never Smoker  . Smokeless tobacco: Never Used  . Alcohol use 0.6 oz/week    1 Glasses of wine per week     Comment: occassionally  . Drug use: No  . Sexual activity: Yes    Partners: Male    Birth control/ protection: Pill   Other Topics Concern  . Not on file   Social History Narrative  . No narrative on file    Family History:  Family History  Problem Relation Age of Onset  . Hyperlipidemia Mother   . Heart disease Father   . Diabetes Father   . COPD Father   . Arthritis Maternal Aunt     Medications [name] had no medications administered during this visit. Current Outpatient Prescriptions  Medication Sig Dispense Refill  . fexofenadine-pseudoephedrine (ALLEGRA-D) 60-120 MG 12 hr tablet Take 1 tablet by mouth 2 (two) times daily. 20 tablet 0  . norgestimate-ethinyl estradiol (ORTHO-CYCLEN,SPRINTEC,PREVIFEM) 0.25-35 MG-MCG tablet Take 1 tablet by mouth daily. Take active pills in a continuous fashion.  3 Package 4  . promethazine (PHENERGAN) 25 MG tablet Take 1 tablet (25 mg total) by mouth every 8 (eight) hours as needed. 30 tablet 0  . zonisamide (ZONEGRAN) 50 MG capsule Take 50 mg by mouth 2 (two) times daily.  0  . zolpidem (AMBIEN) 10 MG tablet Take 1 tablet (10 mg total) by mouth at bedtime as needed for sleep. 30 tablet 5   No current facility-administered medications for this visit.     Allergies Penicillins; Naproxen; Tramadol; and Ibuprofen   Physical Exam:  BP 112/71 (BP Location: Left Arm, Patient Position: Sitting, Cuff Size: Normal)   Pulse (!) 112   Resp 18   Ht 5' 5.5" (1.664 m)   Wt 171 lb (77.6 kg)   LMP 02/18/2016   BMI 28.02 kg/m  Body mass index is 28.02 kg/m. General appearance: Well nourished,  well developed female in no acute distress.   Laboratory: none  Radiology: none  Assessment: pt doing well  Plan:  I d/w her that if the pain ever comes back like that again to come to [facility] for evaluation b/c unable to tell how acute it is based on phone triage. I told her that it's good that her s/s are lessened currently and no additional work up is needed at the moment but several options for long term management d/w her so she can d/w Dr. Harolyn Pearson in January.  She has a history of seasonale/seasonique use last year and she states that it didn't work; I didn't get into specifically why it didn't work. I told her to use her current pills continuously and options for further management if this fails d/w her: LNG IUD, diagnostic scope, depot lupron trial.  In the interim, I told her to start her hormone pills today and not wait until she's done with her current placebo week and to use them continuously and take the placebo pills every season.   Patient amenable to plan  RTC as scheduled.  >50% of visit in face to face counseling with patient and husband  Aletha Halim, Brooke Bonito MD Attending Center for [employer])

## 2016-02-24 ENCOUNTER — Encounter: Payer: Self-pay | Admitting: Obstetrics and Gynecology

## 2016-02-24 DIAGNOSIS — N946 Dysmenorrhea, unspecified: Secondary | ICD-10-CM | POA: Insufficient documentation

## 2016-03-11 ENCOUNTER — Encounter: Payer: Self-pay | Admitting: Obstetrics & Gynecology

## 2016-03-11 ENCOUNTER — Ambulatory Visit (INDEPENDENT_AMBULATORY_CARE_PROVIDER_SITE_OTHER): Payer: BLUE CROSS/BLUE SHIELD | Admitting: Obstetrics & Gynecology

## 2016-03-11 ENCOUNTER — Telehealth: Payer: Self-pay

## 2016-03-11 VITALS — BP 130/89 | HR 106 | Resp 16 | Ht 65.5 in | Wt 171.0 lb

## 2016-03-11 DIAGNOSIS — N946 Dysmenorrhea, unspecified: Secondary | ICD-10-CM

## 2016-03-11 MED ORDER — HYDROCODONE-ACETAMINOPHEN 5-325 MG PO TABS: 1.0000 | ORAL_TABLET | Freq: Four times a day (QID) | ORAL | 0 refills | Status: DC | PRN

## 2016-03-11 NOTE — Progress Notes (Signed)
   GYNECOLOGY OFFICE VISIT NOTE  History:  41 y.o. G1P1001 here today for follow up of dysmenorrhea. Recently taking continuous hormonal pills but reports cramps around the time of her expected period. She is really concerned about pain; was in debilitating pain last month.  She is unable to take NSAIDs and Tramadol due to allergy; pain not alleviated with Tylenol.  She feels she needs something else.  She denies any current  abnormal vaginal discharge, bleeding, pelvic pain or other concerns.   Past Medical History:  Diagnosis Date  . Acute recurrent maxillary sinusitis 03/01/2015  . AD (atopic dermatitis) 06/23/2013  . Anxiety   . Arthralgia of temporomandibular joint 12/19/2013  . Chicken pox   . Migraine headache   . Ovarian cyst 10-2014   right    Past Surgical History:  Procedure Laterality Date  . TONSILLECTOMY  1980    The following portions of the patient's history were reviewed and updated as appropriate: allergies, current medications, past family history, past medical history, past social history, past surgical history and problem list.   Health Maintenance:  Normal pap and negative HRHPV in 2015   Review of Systems:  Pertinent items noted in HPI and remainder of comprehensive ROS otherwise negative.   Objective:  Physical Exam BP 130/89   Pulse (!) 106   Resp 16   Ht 5' 5.5" (1.664 m)   Wt 171 lb (77.6 kg)   LMP 02/18/2016   BMI 28.02 kg/m  CONSTITUTIONAL: Well-developed, well-nourished female in no acute distress.  HENT:  Normocephalic, atraumatic. External right and left ear normal. Oropharynx is clear and moist EYES: Conjunctivae and EOM are normal. Pupils are equal, round, and reactive to light. No scleral icterus.  NECK: Normal range of motion, supple, no masses SKIN: Skin is warm and dry. No rash noted. Not diaphoretic. No erythema. No pallor. NEUROLOGIC: Alert and oriented to person, place, and time. Normal reflexes, muscle tone coordination. No cranial  nerve deficit noted. PSYCHIATRIC: Normal mood and affect. Normal behavior. Normal judgment and thought content. CARDIOVASCULAR: Normal heart rate noted RESPIRATORY: Effort and breath sounds normal, no problems with respiration noted ABDOMEN: Soft, no distention noted.   PELVIC: Deferred MUSCULOSKELETAL: Normal range of motion. No edema noted.   Assessment & Plan:  1. Dysmenorrhea Given persistent dysmenorrhea and escalating pain medication needs, will re-evaluate with pelvic ultrasound to rule out any structural anomaly. Also discussed diagnostic laparoscopy, patient is hesitant to do this.  Will evaluate response to continuous OCPs, also gauge how much pain medications she is needing.   - HYDROcodone-acetaminophen (NORCO/VICODIN) 5-325 MG tablet; Take 1 tablet by mouth every 6 (six) hours as needed.  Dispense: 30 tablet; Refill: 0.  If any more pills are needed, will consider pain contract and laparoscopy needs to be done.  - US Pelvis Complete; Future - US Transvaginal Non-OB; Future  Routine preventative health maintenance measures emphasized. Please refer to After Visit Summary for other counseling recommendations.   Return in about 1 month (around 04/11/2016) for Followup.   Total face-to-face time with patient: 25 minutes. Over 50% of encounter was spent on counseling and coordination of care.   Verita Schneiders, MD, [place] Attending [place], [facility] for [employer], [place]

## 2016-03-11 NOTE — Telephone Encounter (Signed)
Called patient w/ f/u appts. Pt is scheduled for Pelvis/Trans Vag U/s at [facility], called pt w/ appt date/time. Spoke w/ [employer] given.  Scheduled for 01/12 @1045  arrive w/ full bladder. \  Pt understands.

## 2016-03-11 NOTE — Patient Instructions (Addendum)
Return to clinic for any scheduled appointments or for any gynecologic concerns as needed.   Diagnostic Laparoscopy A diagnostic laparoscopy is a procedure to diagnose diseases in the abdomen. During the procedure, a thin, lighted, pencil-sized instrument called a laparoscope is inserted into the abdomen through an incision. The laparoscope allows your health care provider to look at the organs inside your body. Tell a health care provider about:  Any allergies you have.  All medicines you are taking, including vitamins, herbs, eye drops, creams, and over-the-counter medicines.  Any problems you or family members have had with anesthetic medicines.  Any blood disorders you have.  Any surgeries you have had.  Any medical conditions you have. What are the risks? Generally, this is a safe procedure. However, problems can occur, which may include:  Infection.  Bleeding.  Damage to other organs.  Allergic reaction to the anesthetics used during the procedure. What happens before the procedure?  Do not eat or drink anything after midnight on the night before the procedure or as directed by your health care provider.  Ask your health care provider about:  Changing or stopping your regular medicines.  Taking medicines such as aspirin and ibuprofen. These medicines can thin your blood. Do not take these medicines before your procedure if your health care provider instructs you not to.  Plan to have someone take you home after the procedure. What happens during the procedure?  You may be given a medicine to help you relax (sedative).  You will be given a medicine to make you sleep (general anesthetic).  Your abdomen will be inflated with a gas. This will make your organs easier to see.  Small incisions will be made in your abdomen.  A laparoscope and other small instruments will be inserted into the abdomen through the incisions.  A tissue sample may be removed from an organ  in the abdomen for examination.  The instruments will be removed from the abdomen.  The gas will be released.  The incisions will be closed with stitches (sutures). What happens after the procedure? Your blood pressure, heart rate, breathing rate, and blood oxygen level will be monitored often until the medicines you were given have worn off. This information is not intended to replace advice given to you by your health care provider. Make sure you discuss any questions you have with your health care provider. Document Released: 05/27/2000 Document Revised: 06/29/2015 Document Reviewed: 10/01/2013 Elsevier Interactive Patient Education  2017 [employer].

## 2016-03-11 NOTE — Progress Notes (Signed)
Pt wants to talk about medications that can be given to relieve pain during menstrual cycle.

## 2016-03-15 ENCOUNTER — Ambulatory Visit: Payer: PRIVATE HEALTH INSURANCE

## 2016-03-19 ENCOUNTER — Ambulatory Visit
Admission: RE | Admit: 2016-03-19 | Discharge: 2016-03-19 | Disposition: A | Discharge status: Home / Self Care | Payer: BLUE CROSS/BLUE SHIELD | Source: Ambulatory Visit | Attending: Obstetrics & Gynecology | Admitting: Obstetrics & Gynecology

## 2016-03-19 DIAGNOSIS — N946 Dysmenorrhea, unspecified: Secondary | ICD-10-CM | POA: Diagnosis not present

## 2016-04-01 ENCOUNTER — Telehealth: Payer: Self-pay | Admitting: *Deleted

## 2016-04-01 NOTE — Telephone Encounter (Signed)
-----   Message from Blanchie Dessert, [place] sent at 04/01/2016 11:12 AM EST ----- Regarding: results from U/S on 01/16 Contact: [phone] pt called asking to speak to the nurse for results from an ultrasound from 01/16. Please return call when you can   thanks

## 2016-04-01 NOTE — Telephone Encounter (Signed)
Called pt, no answer, left VM that [place] results were WDL and to call back with any questions or concerns.

## 2016-04-10 ENCOUNTER — Other Ambulatory Visit: Payer: Self-pay | Admitting: Family Medicine

## 2016-04-10 DIAGNOSIS — G43009 Migraine without aura, not intractable, without status migrainosus: Secondary | ICD-10-CM

## 2016-04-10 NOTE — Telephone Encounter (Signed)
Medication no longer on list. Pt called and stated shed like to have it refilled. Please advise?

## 2016-05-27 ENCOUNTER — Emergency Department
Admission: EM | Admit: 2016-05-27 | Discharge: 2016-05-27 | Disposition: A | Discharge status: Home / Self Care | Payer: BLUE CROSS/BLUE SHIELD | Attending: Emergency Medicine | Admitting: Emergency Medicine

## 2016-05-27 ENCOUNTER — Emergency Department: Payer: BLUE CROSS/BLUE SHIELD

## 2016-05-27 ENCOUNTER — Encounter: Payer: Self-pay | Admitting: Emergency Medicine

## 2016-05-27 DIAGNOSIS — Y999 Unspecified external cause status: Secondary | ICD-10-CM | POA: Diagnosis not present

## 2016-05-27 DIAGNOSIS — R202 Paresthesia of skin: Secondary | ICD-10-CM | POA: Insufficient documentation

## 2016-05-27 DIAGNOSIS — M542 Cervicalgia: Secondary | ICD-10-CM | POA: Insufficient documentation

## 2016-05-27 DIAGNOSIS — S199XXA Unspecified injury of neck, initial encounter: Secondary | ICD-10-CM | POA: Diagnosis present

## 2016-05-27 DIAGNOSIS — Y92410 Unspecified street and highway as the place of occurrence of the external cause: Secondary | ICD-10-CM | POA: Insufficient documentation

## 2016-05-27 DIAGNOSIS — Y939 Activity, unspecified: Secondary | ICD-10-CM | POA: Diagnosis not present

## 2016-05-27 DIAGNOSIS — M7918 Myalgia, other site: Secondary | ICD-10-CM

## 2016-05-27 MED ORDER — BACLOFEN 10 MG PO TABS: 5.0000 mg | ORAL_TABLET | Freq: Three times a day (TID) | ORAL | 0 refills | Status: DC

## 2016-05-27 MED ORDER — BACLOFEN 10 MG PO TABS
5.0000 mg | ORAL_TABLET | Freq: Once | ORAL | Status: AC
  Administered 2016-05-27: 5 mg via ORAL
  Filled 2016-05-27: qty 1

## 2016-05-27 MED ORDER — ACETAMINOPHEN 325 MG PO TABS
650.0000 mg | ORAL_TABLET | Freq: Once | ORAL | Status: AC
  Administered 2016-05-27: 650 mg via ORAL
  Filled 2016-05-27: qty 2
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
## 2016-06-12 ENCOUNTER — Telehealth: Payer: Self-pay | Admitting: *Deleted

## 2016-06-12 NOTE — Telephone Encounter (Signed)
Spoke to pt, she has been taking OCP continuously over the past few months to avoid having a cycle.  Pt states she started to have some vaginal bleeding about 7 days ago.  Informed pt that she could be experiencing break through bleeding and it can sometimes occur every few months but if it continues to happen and worsens to call back.  Pt acknowledged instructions.

## 2016-06-12 NOTE — Telephone Encounter (Signed)
-----   Message from Blanchie Dessert, [place] sent at 06/11/2016  4:05 PM EDT ----- Regarding: requesting a call back from the nurse Contact: [phone] pls call pt when you get a few, she states that she is on [facility] and is having breaking thru bleeding and wants to speak w you about it

## 2016-06-19 ENCOUNTER — Other Ambulatory Visit: Payer: Self-pay | Admitting: Family Medicine

## 2016-06-20 DIAGNOSIS — E538 Deficiency of other specified B group vitamins: Secondary | ICD-10-CM | POA: Insufficient documentation

## 2016-06-20 HISTORY — DX: Deficiency of other specified B group vitamins: E53.8

## 2016-06-24 ENCOUNTER — Telehealth: Payer: Self-pay | Admitting: *Deleted

## 2016-06-24 NOTE — Telephone Encounter (Signed)
Called pt, no answer, left message to call the office.  

## 2016-06-24 NOTE — Telephone Encounter (Signed)
Pt is currently taking OCPs continuously, started to experience spotting a few weeks ago and has continued and now bleeding is very heavy and is passing some clots.  Scheduled appt for follow-up with Dr Harolyn Rutherford to discuss other options.  Informed pt if the bleeding continued to remain heavy where she was completely saturating a pad per hour and the clots continued to happen more frequently as well as experiencing dizziness with activity to go to [facility] for evaluation.  Pt acknowledged instructions.

## 2016-06-24 NOTE — Telephone Encounter (Signed)
-----   Message from Blanchie Dessert, [place] sent at 06/24/2016  8:54 AM EDT ----- Regarding: pt concerned about abnormal cycle Contact: [phone] Pt is having some cycle issues, with heavy blood clots, even while taking the [facility] continuously

## 2016-06-24 NOTE — Telephone Encounter (Signed)
-----   Message from Blanchie Dessert, [place] sent at 06/24/2016  8:54 AM EDT ----- Regarding: pt concerned about abnormal cycle Contact: [phone] Pt is having some cycle issues, with heavy blood clots, even while taking the [facility] continuously

## 2016-07-02 ENCOUNTER — Ambulatory Visit: Payer: PRIVATE HEALTH INSURANCE | Admitting: Obstetrics & Gynecology

## 2016-07-08 ENCOUNTER — Ambulatory Visit (INDEPENDENT_AMBULATORY_CARE_PROVIDER_SITE_OTHER): Payer: PRIVATE HEALTH INSURANCE | Admitting: Obstetrics & Gynecology

## 2016-07-08 VITALS — BP 116/80 | HR 88 | Resp 18 | Ht 65.5 in | Wt 171.0 lb

## 2016-07-08 DIAGNOSIS — N939 Abnormal uterine and vaginal bleeding, unspecified: Secondary | ICD-10-CM | POA: Diagnosis not present

## 2016-07-08 MED ORDER — PROMETHAZINE HCL 25 MG PO TABS: 25.0000 mg | ORAL_TABLET | Freq: Three times a day (TID) | ORAL | 5 refills | Status: DC | PRN

## 2016-07-08 MED ORDER — MEGESTROL ACETATE 40 MG PO TABS: 40.0000 mg | ORAL_TABLET | Freq: Two times a day (BID) | ORAL | 5 refills | Status: DC

## 2016-07-08 NOTE — Patient Instructions (Signed)
Endometrial Ablation Endometrial ablation is a procedure that destroys the thin inner layer of the lining of the uterus (endometrium). This procedure may be done:  To stop heavy periods.  To stop bleeding that is causing anemia.  To control irregular bleeding.  To treat bleeding caused by small tumors (fibroids) in the endometrium.  This procedure is often an alternative to major surgery, such as removal of the uterus and cervix (hysterectomy). As a result of this procedure:  You may not be able to have children. However, if you are premenopausal (you have not gone through menopause): ? You may still have a small chance of getting pregnant. ? You will need to use a reliable method of birth control after the procedure to prevent pregnancy.  You may stop having a menstrual period, or you may have only a small amount of bleeding during your period. Menstruation may return several years after the procedure.  Tell a health care provider about:  Any allergies you have.  All medicines you are taking, including vitamins, herbs, eye drops, creams, and over-the-counter medicines.  Any problems you or family members have had with the use of anesthetic medicines.  Any blood disorders you have.  Any surgeries you have had.  Any medical conditions you have. What are the risks? Generally, this is a safe procedure. However, problems may occur, including:  A hole (perforation) in the uterus or bowel.  Infection of the uterus, bladder, or vagina.  Bleeding.  Damage to other structures or organs.  An air bubble in the lung (air embolus).  Problems with pregnancy after the procedure.  Failure of the procedure.  Decreased ability to diagnose cancer in the endometrium.  What happens before the procedure?  You will have tests of your endometrium to make sure there are no pre-cancerous cells or cancer cells present.  You may have an ultrasound of the uterus.  You may be given  medicines to thin the endometrium.  Ask your health care provider about: ? Changing or stopping your regular medicines. This is especially important if you take diabetes medicines or blood thinners. ? Taking medicines such as aspirin and ibuprofen. These medicines can thin your blood. Do not take these medicines before your procedure if your doctor tells you not to.  Plan to have someone take you home from the hospital or clinic. What happens during the procedure?  You will lie on an exam table with your feet and legs supported as in a pelvic exam.  To lower your risk of infection: ? Your health care team will wash or sanitize their hands and put on germ-free (sterile) gloves. ? Your genital area will be washed with soap.  An IV tube will be inserted into one of your veins.  You will be given a medicine to help you relax (sedative).  A surgical instrument with a light and camera (resectoscope) will be inserted into your vagina and moved into your uterus. This allows your surgeon to see inside your uterus.  Endometrial tissue will be removed using one of the following methods: ? Radiofrequency. This method uses a radiofrequency-alternating electric current to remove the endometrium. ? Cryotherapy. This method uses extreme cold to freeze the endometrium. ? Heated-free liquid. This method uses a heated saltwater (saline) solution to remove the endometrium. ? Microwave. This method uses high-energy microwaves to heat up the endometrium and remove it. ? Thermal balloon. This method involves inserting a catheter with a balloon tip into the uterus. The balloon tip is   filled with heated fluid to remove the endometrium. The procedure may vary among health care providers and hospitals. What happens after the procedure?  Your blood pressure, heart rate, breathing rate, and blood oxygen level will be monitored until the medicines you were given have worn off.  As tissue healing occurs, you may  notice vaginal bleeding for 4-6 weeks after the procedure. You may also experience: ? Cramps. ? Thin, watery vaginal discharge that is light pink or brown in color. ? A need to urinate more frequently than usual. ? Nausea.  Do not drive for 24 hours if you were given a sedative.  Do not have sex or insert anything into your vagina until your health care provider approves. Summary  Endometrial ablation is done to treat the many causes of heavy menstrual bleeding.  The procedure may be done only after medications have been tried to control the bleeding.  Plan to have someone take you home from the hospital or clinic. This information is not intended to replace advice given to you by your health care provider. Make sure you discuss any questions you have with your health care provider. Document Released: 12/29/2003 Document Revised: 03/07/2016 Document Reviewed: 03/07/2016 Elsevier Interactive Patient Education  2017 Elsevier Inc.  

## 2016-07-08 NOTE — Progress Notes (Signed)
   GYNECOLOGY OFFICE VISIT NOTE  History:  41 y.o. G1P1001 here today for breakthrough bleeding on continuous OCPs for the past two months. No pain. She denies any abnormal vaginal discharge,  pelvic pain or other concerns.   Past Medical History:  Diagnosis Date  . Acute recurrent maxillary sinusitis 03/01/2015  . AD (atopic dermatitis) 06/23/2013  . Anxiety   . Arthralgia of temporomandibular joint 12/19/2013  . Chicken pox   . Migraine headache   . Ovarian cyst 10-2014   right    Past Surgical History:  Procedure Laterality Date  . TONSILLECTOMY  1980    The following portions of the patient's history were reviewed and updated as appropriate: allergies, current medications, past family history, past medical history, past social history, past surgical history and problem list.   Health Maintenance:  Normal pap in 2015. Never had mammogram, declines for now.  Review of Systems:  Pertinent items noted in HPI and remainder of comprehensive ROS otherwise negative.   Objective:  Physical Exam BP 116/80 (BP Location: Left Arm, Patient Position: Sitting, Cuff Size: Normal)   Pulse 88   Resp 18   Ht 5' 5.5" (1.664 m)   Wt 171 lb (77.6 kg)   LMP 05/08/2016   BMI 28.02 kg/m  CONSTITUTIONAL: Well-developed, well-nourished female in no acute distress.  HENT:  Normocephalic, atraumatic. External right and left ear normal. Oropharynx is clear and moist EYES: Conjunctivae and EOM are normal. Pupils are equal, round, and reactive to light. No scleral icterus.  NECK: Normal range of motion, supple, no masses SKIN: Skin is warm and dry. No rash noted. Not diaphoretic. No erythema. No pallor. NEUROLOGIC: Alert and oriented to person, place, and time. Normal reflexes, muscle tone coordination. No cranial nerve deficit noted. PSYCHIATRIC: Normal mood and affect. Normal behavior. Normal judgment and thought content. CARDIOVASCULAR: Normal heart rate noted RESPIRATORY: Effort and breath  sounds normal, no problems with respiration noted ABDOMEN: Soft, no distention noted.   PELVIC: Deferred MUSCULOSKELETAL: Normal range of motion. No edema noted.  Labs and Imaging 03/19/2016 TRANSABDOMINAL AND TRANSVAGINAL ULTRASOUND OF PELVIS CLINICAL DATA:  Dysmenorrhea for 6 months COMPARISON:  None FINDINGS: Uterus Measurements: 9 x 4.9 x 5.8 cm. No fibroids or other mass visualized. EndometriumThickness: 5.2 mm.  No focal abnormality visualized. Right ovaryMeasurements: 3.8 x 1.2 x 2.4 cm. Normal appearance/no adnexal mass. Left ovary Measurements: 3.1 x 1.1 x 2.0 cm. Normal appearance/no adnexal mass. Other findingsNo abnormal free fluid. IMPRESSION: Normal pelvic sonogram.   Assessment & Plan:  1. Abnormal uterine bleeding (AUB) Will do trial of Megace for two weeks. If this does not work, consider endometrial ablation. Patient does not desire IUD.  - megestrol (MEGACE) 40 MG tablet; Take 1 tablet (40 mg total) by mouth 2 (two) times daily. Can increase to two tablets twice a day in the event of heavy bleeding  Dispense: 30 tablet; Refill: 5 - promethazine (PHENERGAN) 25 MG tablet; Take 1 tablet (25 mg total) by mouth every 8 (eight) hours as needed.  Dispense: 30 tablet; Refill: 5  Routine preventative health maintenance measures emphasized. Please refer to After Visit Summary for other counseling recommendations.   Return if symptoms worsen or fail to improve.   Total face-to-face time with patient: 15 minutes. Over 50% of encounter was spent on counseling and coordination of care.   Verita Schneiders, MD, [place] Attending [place], [facility] for [employer], [place]

## 2016-10-29 ENCOUNTER — Other Ambulatory Visit: Payer: Self-pay | Admitting: Lab

## 2016-10-29 ENCOUNTER — Other Ambulatory Visit: Payer: Self-pay | Admitting: Obstetrics & Gynecology

## 2016-10-29 DIAGNOSIS — N946 Dysmenorrhea, unspecified: Secondary | ICD-10-CM

## 2016-10-29 MED ORDER — NORGESTIMATE-ETH ESTRADIOL 0.25-35 MG-MCG PO TABS: 1.0000 | ORAL_TABLET | Freq: Every day | ORAL | 4 refills | Status: DC

## 2016-10-29 NOTE — Telephone Encounter (Signed)
Patient request refill on Sprintec

## 2016-12-28 ENCOUNTER — Emergency Department
Admission: EM | Admit: 2016-12-28 | Discharge: 2016-12-29 | Disposition: A | Discharge status: Home / Self Care | Payer: BLUE CROSS/BLUE SHIELD | Attending: Emergency Medicine | Admitting: Emergency Medicine

## 2016-12-28 ENCOUNTER — Encounter: Payer: Self-pay | Admitting: Emergency Medicine

## 2016-12-28 DIAGNOSIS — G43909 Migraine, unspecified, not intractable, without status migrainosus: Secondary | ICD-10-CM | POA: Diagnosis not present

## 2016-12-28 DIAGNOSIS — Z79899 Other long term (current) drug therapy: Secondary | ICD-10-CM | POA: Insufficient documentation

## 2016-12-28 DIAGNOSIS — M791 Myalgia, unspecified site: Secondary | ICD-10-CM | POA: Insufficient documentation

## 2016-12-28 DIAGNOSIS — R51 Headache: Secondary | ICD-10-CM | POA: Diagnosis present

## 2016-12-28 DIAGNOSIS — M7918 Myalgia, other site: Secondary | ICD-10-CM

## 2016-12-28 DIAGNOSIS — G43809 Other migraine, not intractable, without status migrainosus: Secondary | ICD-10-CM

## 2016-12-28 MED ORDER — SODIUM CHLORIDE 0.9 % IV BOLUS (SEPSIS)
1000.0000 mL | Freq: Once | INTRAVENOUS | Status: AC
  Administered 2016-12-28: 1000 mL via INTRAVENOUS

## 2016-12-28 MED ORDER — PROCHLORPERAZINE EDISYLATE 5 MG/ML IJ SOLN
INTRAMUSCULAR | Status: AC
Start: 2016-12-28 — End: 2016-12-28
  Administered 2016-12-28: 10 mg via INTRAVENOUS
  Filled 2016-12-28: qty 2

## 2016-12-28 MED ORDER — DIAZEPAM 2 MG PO TABS
2.0000 mg | ORAL_TABLET | Freq: Once | ORAL | Status: AC
  Administered 2016-12-28: 2 mg via ORAL
  Filled 2016-12-28: qty 1

## 2016-12-28 MED ORDER — BUTALBITAL-APAP-CAFFEINE 50-325-40 MG PO TABS
2.0000 | ORAL_TABLET | Freq: Once | ORAL | Status: AC
  Administered 2016-12-28: 2 via ORAL
  Filled 2016-12-28: qty 2

## 2016-12-28 MED ORDER — BUTALBITAL-APAP-CAFFEINE 50-325-40 MG PO TABS: 1.0000 | ORAL_TABLET | Freq: Four times a day (QID) | ORAL | 0 refills | Status: DC | PRN

## 2016-12-28 MED ORDER — LIDOCAINE 5 % EX PTCH
1.0000 | MEDICATED_PATCH | CUTANEOUS | Status: DC
  Administered 2016-12-28: 1 via TRANSDERMAL
  Filled 2016-12-28: qty 1

## 2016-12-28 MED ORDER — PROCHLORPERAZINE EDISYLATE 5 MG/ML IJ SOLN
10.0000 mg | Freq: Once | INTRAMUSCULAR | Status: AC
  Administered 2016-12-28: 10 mg via INTRAVENOUS

## 2016-12-28 NOTE — ED Provider Notes (Signed)
H&P  [facility] Emergency Department Provider Note   ____________________________________________   First MD Initiated Contact with Patient 12/28/16 2305     (approximate)  I have reviewed the triage vital signs and the nursing notes.   HISTORY  Chief Complaint Migraine    HPI [name] is a 41 y.o. female Who comes into the hospital today with a migraine headache. The patient reports that she also has some pain to the left side of her shoulder. She states that her uncle died last weekend and his funeral was on Wednesday. She reports that her headache and the shoulder pain started on Wednesday. She presumes that she just was carrying everything and that's why it all started. The patient typically takes zonisamide daily for her headaches but she thinks it might stop working. She has some Zomig nasal sprays that she also has taken twice but has not helped. The patient states that she took a bath and some Epsom salts and use ice pack and heat but it hasn't helped. The patient even took some Phenergan at home but it didn't help with her nausea. The patient reports that she was given some medication when she arrived and her headache has improved significantly. She states her headache is a 4 out of 10 in intensity but she does have some pressure and soreness in her neck going up into the back of her head. The patient is here today for evaluation of her symptoms. She has had some light and sound sensitivity as well as some nausea.   Past Medical History:  Diagnosis Date  . Acute recurrent maxillary sinusitis 03/01/2015  . AD (atopic dermatitis) 06/23/2013  . Anxiety   . Arthralgia of temporomandibular joint 12/19/2013  . Chicken pox   . Migraine headache   . Ovarian cyst 10-2014   right    Patient Active Problem List   Diagnosis Date Noted  . Abnormal uterine bleeding (AUB) 07/08/2016  . Dysmenorrhea 02/24/2016  . Anxiety 01/09/2015  . Allergic  rhinitis 06/10/2013  . Acid reflux 05/11/2012  . Migraine without aura 09/25/2010  . Insomnia 09/25/2010    Past Surgical History:  Procedure Laterality Date  . TONSILLECTOMY  1980    Prior to Admission medications   Medication Sig Start Date End Date Taking? Authorizing Provider  butalbital-acetaminophen-caffeine (FIORICET, ESGIC) 50-325-40 MG tablet Take 1-2 tablets by mouth every 6 (six) hours as needed for headache. 12/28/16 12/28/17  Loney Hering, MD  megestrol (MEGACE) 40 MG tablet Take 1 tablet (40 mg total) by mouth 2 (two) times daily. Can increase to two tablets twice a day in the event of heavy bleeding 07/08/16   Anyanwu, Sallyanne Havers, MD  norgestimate-ethinyl estradiol (ORTHO-CYCLEN,SPRINTEC,PREVIFEM) 0.25-35 MG-MCG tablet Take 1 tablet by mouth daily. Take active pills in a continuous fashion. 10/29/16   Anyanwu, Sallyanne Havers, MD  promethazine (PHENERGAN) 25 MG tablet Take 1 tablet (25 mg total) by mouth every 8 (eight) hours as needed. 07/08/16   Anyanwu, Sallyanne Havers, MD  SPRINTEC 28 0.25-35 MG-MCG tablet TAKE 1 TABLET BY MOUTH DAILY 10/29/16   Anyanwu, Sallyanne Havers, MD  zolmitriptan (ZOMIG) 5 MG nasal solution Place into the nose as needed for migraine.    [provider]  zonisamide (ZONEGRAN) 50 MG capsule Take 50 mg by mouth 2 (two) times daily. 12/21/15   [provider]    Allergies Penicillins; Naproxen; Tramadol; and Ibuprofen  Family History  Problem Relation Age of Onset  . Hyperlipidemia Mother   .  Heart disease Father   . Diabetes Father   . COPD Father   . Arthritis Maternal Aunt     Social History Social History  Substance Use Topics  . Smoking status: Never Smoker  . Smokeless tobacco: Never Used  . Alcohol use 0.6 oz/week    1 Glasses of wine per week     Comment: occassionally    Review of Systems  Constitutional: No fever/chills Eyes: No visual changes. ENT: No sore throat. Cardiovascular: Denies chest pain. Respiratory: Denies  shortness of breath. Gastrointestinal: nausea with No abdominal pain,  no vomiting.  No diarrhea.  No constipation. Genitourinary: Negative for dysuria. Musculoskeletal: neck pain, shoulder pain Skin: Negative for rash. Neurological: headache   ____________________________________________   PHYSICAL EXAM:  VITAL SIGNS: ED Triage Vitals  Enc Vitals Group     BP 12/28/16 1934 128/85     Pulse Rate 12/28/16 1934 91     Resp 12/28/16 1934 (!) 2     Temp 12/28/16 1934 98.4 F (36.9 C)     Temp Source 12/28/16 1934 Oral     SpO2 12/28/16 1934 99 %     Weight 12/28/16 1934 165 lb (74.8 kg)     Height 12/28/16 1934 5\' 5"  (1.651 m)     Head Circumference --      Peak Flow --      Pain Score 12/28/16 1933 9     Pain Loc --      Pain Edu? --      Excl. in [place]? --     Constitutional: Alert and oriented. Well appearing and in moderate distress. Eyes: Conjunctivae are normal. PERRL. EOMI. Head: Atraumatic. Nose: No congestion/rhinnorhea. Mouth/Throat: Mucous membranes are moist.  Oropharynx non-erythematous. Neck: some lateral neck and trapezius muscle tenderness to palpation Cardiovascular: Normal rate, regular rhythm. Grossly normal heart sounds.  Good peripheral circulation. Respiratory: Normal respiratory effort.  No retractions. Lungs CTAB. Gastrointestinal: Soft and nontender. No distention. positive bowel sounds Musculoskeletal: mild left shoulder tenderness to palpation  Neurologic:  Normal speech and language. cranial nerves II through XII are grossly intact with no focal motor or neuro deficit Skin:  Skin is warm, dry and intact.  Psychiatric: Mood and affect are normal.   ____________________________________________   LABS (all labs ordered are listed, but only abnormal results are displayed)  Labs Reviewed - No data to display ____________________________________________  EKG  none ____________________________________________  RADIOLOGY  No results  found.  ____________________________________________   PROCEDURES  Procedure(s) performed: None  Procedures  Critical Care performed: No  ____________________________________________   INITIAL IMPRESSION / ASSESSMENT AND PLAN / ED COURSE  As part of my medical decision making, I reviewed the following data within the electronic MEDICAL RECORD NUMBER Notes from prior ED visits and  Controlled Substance Database   This is a 41 year old female who comes into the hospital today with a migraine headache. The patient has had some stress this week from the death of a family member as well as his funeral. The patient received a dose of Phenergan and some fluids and her headache was improved. The patient has a history of migraines and reports that this is typical of her migraines except it has not gone away. I did give the patient a dose of Fioricet as well as some Valium and a Lidoderm patch for her neck soreness. The patient's pain may be due to the stress that she has had this week. After the medication the patient states that her pain is  100% gone. She will be discharged home to follow-up with her primary care physician. the patient has been seen for neck pain in physical therapy in the past.      ____________________________________________   FINAL CLINICAL IMPRESSION(S) / ED DIAGNOSES  Final diagnoses:  Other migraine without status migrainosus, not intractable  Musculoskeletal pain      NEW MEDICATIONS STARTED DURING THIS VISIT:  Discharge Medication List as of 12/28/2016 11:56 PM    START taking these medications   Details  butalbital-acetaminophen-caffeine (FIORICET, ESGIC) 50-325-40 MG tablet Take 1-2 tablets by mouth every 6 (six) hours as needed for headache., Starting Sat 12/28/2016, Until Sun 12/28/2017, Print         Note:  This document was prepared using Dragon voice recognition software and may include unintentional dictation errors.    Loney Hering,  MD 12/29/16 [phone]

## 2016-12-28 NOTE — Discharge Instructions (Signed)
Please follow up with your primary care physician.

## 2016-12-28 NOTE — ED Triage Notes (Signed)
Pt ambulatory to triage in NAD, report migraine since June 25, 2022, took zomig and phenergen at home w/o relief.  Pt reports pain/tightness in left shoulder as well.  Pt reports recent death in family w/ funeral on 06/25/22.

## 2016-12-28 NOTE — ED Notes (Signed)
Patient states hx of migraines.  Pt. States pastor died this past week causing additional stressors.  Pt. States migraines are controlled by daily and emergent medications she takes, but due to additional stressors current medication did not work.  Pt. States feeling better after getting fluid and medication in triage.

## 2016-12-29 NOTE — ED Notes (Signed)
Pt. Going home with husband. 

## 2017-02-03 ENCOUNTER — Telehealth: Payer: Self-pay

## 2017-02-03 NOTE — Telephone Encounter (Signed)
Patient called stating she is in horrible pain from her cycle. She has taken tylenol but it has not work. I have advised her on how much she can take per dose but patient does not want to do this.  She is allergic to ibuprofen. Patient would like to know what she can do for her HORRIBLE CRAMPS.

## 2017-02-04 ENCOUNTER — Encounter: Payer: Self-pay | Admitting: Obstetrics & Gynecology

## 2017-02-04 ENCOUNTER — Ambulatory Visit (INDEPENDENT_AMBULATORY_CARE_PROVIDER_SITE_OTHER): Payer: PRIVATE HEALTH INSURANCE | Admitting: Obstetrics & Gynecology

## 2017-02-04 VITALS — BP 136/74 | HR 96 | Temp 98.1°F | Wt 175.0 lb

## 2017-02-04 DIAGNOSIS — R102 Pelvic and perineal pain: Secondary | ICD-10-CM | POA: Diagnosis not present

## 2017-02-04 NOTE — Patient Instructions (Addendum)
Abdominal migraines   Return to clinic for any scheduled appointments or for any gynecologic concerns as needed.

## 2017-02-04 NOTE — Progress Notes (Signed)
RGYN pt presents for problem visit today. C/O: Pelvic pain x 3 days.  Pt has tried using a heating pad  And tylenol still no relief.  Pt states the pain is recurring and has been a issue this past year.

## 2017-02-04 NOTE — Progress Notes (Signed)
   GYNECOLOGY OFFICE VISIT NOTE  History:  41 y.o. G1P1001 here today for evaluation of pelvic pain.  She has occasional pelvic pain described as originating low in the pelvis and radiating upwards in a "clawing" sensation.  Can be debilitating, not alleviated by Tylenol. Has history of migraines, has been on different regimens for migraine pain and was recently started on Nortriptyline and Fioricet. She denies any abnormal vaginal discharge, bleeding or other concerns. She does report going through stressful times lately; uncle recently passed away.   Past Medical History:  Diagnosis Date  . Acute recurrent maxillary sinusitis 03/01/2015  . AD (atopic dermatitis) 06/23/2013  . Anxiety   . Arthralgia of temporomandibular joint 12/19/2013  . Chicken pox   . Migraine headache   . Ovarian cyst 10-2014   right    Past Surgical History:  Procedure Laterality Date  . TONSILLECTOMY  1980    The following portions of the patient's history were reviewed and updated as appropriate: allergies, current medications, past family history, past medical history, past social history, past surgical history and problem list.   Health Maintenance:  Normal pap in 2015. Never had mammogram, declines for now.  Review of Systems:  Pertinent items noted in HPI and remainder of comprehensive ROS otherwise negative.  Objective:  Physical Exam BP 136/74   Pulse 96   Temp 98.1 F (36.7 C) (Oral)   Wt 175 lb (79.4 kg)   BMI 29.12 kg/m  CONSTITUTIONAL: Well-developed, well-nourished female in no acute distress.  HENT:  Normocephalic, atraumatic. External right and left ear normal. Oropharynx is clear and moist EYES: Conjunctivae and EOM are normal. Pupils are equal, round, and reactive to light. No scleral icterus.  NECK: Normal range of motion, supple, no masses SKIN: Skin is warm and dry. No rash noted. Not diaphoretic. No erythema. No pallor. NEUROLOGIC: Alert and oriented to person, place, and time.  Normal reflexes, muscle tone coordination. No cranial nerve deficit noted. PSYCHIATRIC: Normal mood and affect. Normal behavior. Normal judgment and thought content. CARDIOVASCULAR: Normal heart rate noted RESPIRATORY: Effort and breath sounds normal, no problems with respiration noted ABDOMEN: Soft, no distention noted.   PELVIC: Normal appearing external genitalia; normal appearing vaginal mucosa and cervix.  No abnormal discharge noted.  Normal uterine size, no other palpable masses, mild diffuse uterine and adnexal tenderness (R>L). MUSCULOSKELETAL: Normal range of motion. No edema noted.  Labs and Imaging No results found.  Assessment & Plan:  1. Pelvic pain in female Unclear etiology of her pain.  No associated bleeding (on continuous OCPs). Recent stressful situation which could manifest as pain.  Will get ultrasound to evaluate for any structural anomalies.  Continue Nortriptyline and Fioricet for now; may consider Neurontin also if there are no GYN anomalies. Continue to follow closely.  - US PELVIC COMPLETE WITH TRANSVAGINAL; Future  Routine preventative health maintenance measures emphasized. Please refer to After Visit Summary for other counseling recommendations.   Return if symptoms worsen or fail to improve.   Total face-to-face time with patient: 25 minutes.  Over 50% of encounter was spent on counseling and coordination of care.   Verita Schneiders, MD, [place] Attending [place], [facility] for [employer], [place]

## 2017-02-05 DIAGNOSIS — R519 Headache, unspecified: Secondary | ICD-10-CM | POA: Diagnosis present

## 2017-02-06 ENCOUNTER — Ambulatory Visit (HOSPITAL_COMMUNITY)
Admission: RE | Admit: 2017-02-06 | Discharge: 2017-02-06 | Disposition: A | Discharge status: Home / Self Care | Payer: PRIVATE HEALTH INSURANCE | Source: Ambulatory Visit | Attending: Obstetrics & Gynecology | Admitting: Obstetrics & Gynecology

## 2017-02-06 DIAGNOSIS — R102 Pelvic and perineal pain: Secondary | ICD-10-CM | POA: Diagnosis not present

## 2017-02-13 ENCOUNTER — Other Ambulatory Visit: Payer: Self-pay | Admitting: Neurology

## 2017-02-13 DIAGNOSIS — R51 Headache: Principal | ICD-10-CM

## 2017-02-13 DIAGNOSIS — R519 Headache, unspecified: Secondary | ICD-10-CM

## 2017-02-20 ENCOUNTER — Ambulatory Visit
Admission: RE | Admit: 2017-02-20 | Discharge: 2017-02-20 | Disposition: A | Discharge status: Home / Self Care | Payer: PRIVATE HEALTH INSURANCE | Source: Ambulatory Visit | Attending: Neurology | Admitting: Neurology

## 2017-02-20 DIAGNOSIS — R51 Headache: Secondary | ICD-10-CM | POA: Diagnosis present

## 2017-02-20 DIAGNOSIS — R519 Headache, unspecified: Secondary | ICD-10-CM

## 2017-03-21 DIAGNOSIS — M5481 Occipital neuralgia: Secondary | ICD-10-CM | POA: Insufficient documentation

## 2017-03-21 HISTORY — DX: Occipital neuralgia: M54.81

## 2017-08-04 ENCOUNTER — Other Ambulatory Visit: Payer: Self-pay | Admitting: Obstetrics and Gynecology

## 2017-08-04 DIAGNOSIS — N838 Other noninflammatory disorders of ovary, fallopian tube and broad ligament: Secondary | ICD-10-CM

## 2017-08-14 ENCOUNTER — Other Ambulatory Visit: Payer: PRIVATE HEALTH INSURANCE

## 2017-10-24 ENCOUNTER — Ambulatory Visit
Admission: RE | Admit: 2017-10-24 | Discharge: 2017-10-24 | Disposition: A | Discharge status: Home / Self Care | Payer: PRIVATE HEALTH INSURANCE | Source: Ambulatory Visit | Attending: Obstetrics and Gynecology | Admitting: Obstetrics and Gynecology

## 2017-10-24 DIAGNOSIS — N838 Other noninflammatory disorders of ovary, fallopian tube and broad ligament: Secondary | ICD-10-CM

## 2017-10-24 MED ORDER — IOPAMIDOL (ISOVUE-300) INJECTION 61%
100.0000 mL | Freq: Once | INTRAVENOUS | Status: AC | PRN
  Administered 2017-10-24: 100 mL via INTRAVENOUS

## 2017-11-25 ENCOUNTER — Other Ambulatory Visit: Payer: Self-pay | Admitting: Obstetrics and Gynecology

## 2017-12-01 NOTE — Patient Instructions (Addendum)
Hawraa Mann-Bailey  12/01/2017   Your procedure is scheduled on: 12-18-17     Report to [facility] Main  Entrance    Report to Admitting at 11:00 AM    Call this number if you have problems the morning of surgery [phone]     Remember: Do not eat food or drink liquids :After Midnight. You may have a Clear Liquid Diet from Midnight until 7:00 AM. After 7:00 AM, nothing until after surgery    CLEAR LIQUID DIET   Foods Allowed                                                                     Foods Excluded  Coffee and tea, regular and decaf                             liquids that you cannot  Plain Jell-O in any flavor                                             see through such as: Fruit ices (not with fruit pulp)                                     milk, soups, orange juice  Iced Popsicles                                    All solid food Carbonated beverages, regular and diet                                    Cranberry, grape and apple juices Sports drinks like Gatorade Lightly seasoned clear broth or consume(fat free) Sugar, honey syrup  Sample Menu Breakfast                                Lunch                                     Supper Cranberry juice                    Beef broth                            Chicken broth Jell-O                                     Grape juice  Apple juice Coffee or tea                        Jell-O                                      Popsicle                                                Coffee or tea                        Coffee or tea  _____________________________________________________________________     BRUSH YOUR TEETH MORNING OF SURGERY AND RINSE YOUR MOUTH OUT, NO CHEWING GUM CANDY OR MINTS.     Take these medicines the morning of surgery with A SIP OF WATER: None. You may bring and use your nasal spray as needed.                                You may not have any  metal on your body including hair pins and              piercings  Do not wear jewelry, make-up, lotions, powders or perfumes, deodorant             Do not wear nail polish.  Do not shave  48 hours prior to surgery.                 Do not bring valuables to the hospital. [place].  Contacts, dentures or bridgework may not be worn into surgery.     Patients discharged the day of surgery will not be allowed to drive home.  Name and phone number of your driver: Earma Reading [phone]  Special Instructions: N/A              Please read over the following fact sheets you were given: _____________________________________________________________________  [facility] - Preparing for Surgery Before surgery, you can play an important role.  Because skin is not sterile, your skin needs to be as free of germs as possible.  You can reduce the number of germs on your skin by washing with CHG (chlorahexidine gluconate) soap before surgery.  CHG is an antiseptic cleaner which kills germs and bonds with the skin to continue killing germs even after washing. Please DO NOT use if you have an allergy to CHG or antibacterial soaps.  If your skin becomes reddened/irritated stop using the CHG and inform your nurse when you arrive at Short Stay. Do not shave (including legs and underarms) for at least 48 hours prior to the first CHG shower.  You may shave your face/neck. Please follow these instructions carefully:  1.  Shower with CHG Soap the night before surgery and the  morning of Surgery.  2.  If you choose to wash your hair, wash your hair first as usual with your  normal  shampoo.  3.  After you shampoo, rinse your hair and body thoroughly to remove the  shampoo.  4.  Use CHG as you would any other liquid soap.  You can apply chg directly  to the skin and wash                       Gently with a scrungie or clean washcloth.  5.   Apply the CHG Soap to your body ONLY FROM THE NECK DOWN.   Do not use on face/ open                           Wound or open sores. Avoid contact with eyes, ears mouth and genitals (private parts).                       Wash face,  Genitals (private parts) with your normal soap.             6.  Wash thoroughly, paying special attention to the area where your surgery  will be performed.  7.  Thoroughly rinse your body with warm water from the neck down.  8.  DO NOT shower/wash with your normal soap after using and rinsing off  the CHG Soap.                9.  Pat yourself dry with a clean towel.            10.  Wear clean pajamas.            11.  Place clean sheets on your bed the night of your first shower and do not  sleep with pets. Day of Surgery : Do not apply any lotions/deodorants the morning of surgery.  Please wear clean clothes to the hospital/surgery center.  FAILURE TO FOLLOW THESE INSTRUCTIONS MAY RESULT IN THE CANCELLATION OF YOUR SURGERY PATIENT SIGNATURE_________________________________  NURSE SIGNATURE__________________________________  ________________________________________________________________________

## 2017-12-04 ENCOUNTER — Other Ambulatory Visit: Payer: Self-pay

## 2017-12-04 ENCOUNTER — Encounter (HOSPITAL_COMMUNITY): Payer: Self-pay | Admitting: *Deleted

## 2017-12-04 ENCOUNTER — Encounter (HOSPITAL_COMMUNITY)
Admission: RE | Admit: 2017-12-04 | Discharge: 2017-12-04 | Disposition: A | Discharge status: Home / Self Care | Payer: BC Managed Care – PPO | Source: Ambulatory Visit | Attending: Obstetrics and Gynecology | Admitting: Obstetrics and Gynecology

## 2017-12-04 DIAGNOSIS — Z01812 Encounter for preprocedural laboratory examination: Secondary | ICD-10-CM | POA: Diagnosis present

## 2017-12-04 LAB — CBC
HCT: 40.7 % (ref 36.0–46.0)
HEMOGLOBIN: 13.7 g/dL (ref 12.0–15.0)
MCH: 29.7 pg (ref 26.0–34.0)
MCHC: 33.7 g/dL (ref 30.0–36.0)
MCV: 88.3 fL (ref 78.0–100.0)
PLATELETS: 340 10*3/uL (ref 150–400)
RBC: 4.61 MIL/uL (ref 3.87–5.11)
RDW: 13 % (ref 11.5–15.5)
WBC: 7.7 10*3/uL (ref 4.0–10.5)

## 2017-12-18 ENCOUNTER — Ambulatory Visit (HOSPITAL_COMMUNITY)
Admission: RE | Admit: 2017-12-18 | Discharge: 2017-12-18 | Disposition: A | Discharge status: Home / Self Care | Payer: BC Managed Care – PPO | Source: Ambulatory Visit | Attending: Obstetrics and Gynecology | Admitting: Obstetrics and Gynecology

## 2017-12-18 ENCOUNTER — Encounter (HOSPITAL_COMMUNITY)
Admission: RE | Disposition: A | Discharge status: Home / Self Care | Payer: Self-pay | Source: Ambulatory Visit | Attending: Obstetrics and Gynecology

## 2017-12-18 ENCOUNTER — Ambulatory Visit (HOSPITAL_COMMUNITY): Payer: BC Managed Care – PPO | Admitting: Anesthesiology

## 2017-12-18 ENCOUNTER — Encounter (HOSPITAL_COMMUNITY): Payer: Self-pay | Admitting: *Deleted

## 2017-12-18 ENCOUNTER — Other Ambulatory Visit: Payer: Self-pay

## 2017-12-18 DIAGNOSIS — Z79899 Other long term (current) drug therapy: Secondary | ICD-10-CM | POA: Insufficient documentation

## 2017-12-18 DIAGNOSIS — F419 Anxiety disorder, unspecified: Secondary | ICD-10-CM | POA: Insufficient documentation

## 2017-12-18 DIAGNOSIS — R102 Pelvic and perineal pain: Secondary | ICD-10-CM | POA: Insufficient documentation

## 2017-12-18 DIAGNOSIS — D270 Benign neoplasm of right ovary: Secondary | ICD-10-CM | POA: Insufficient documentation

## 2017-12-18 DIAGNOSIS — K219 Gastro-esophageal reflux disease without esophagitis: Secondary | ICD-10-CM | POA: Insufficient documentation

## 2017-12-18 HISTORY — PX: LAPAROSCOPIC OVARIAN CYSTECTOMY: SHX6248

## 2017-12-18 SURGERY — EXCISION, CYST, OVARY, LAPAROSCOPIC
Anesthesia: General | Laterality: Right

## 2017-12-18 MED ORDER — MEPERIDINE HCL 50 MG/ML IJ SOLN: 6.2500 mg | INTRAMUSCULAR | Status: DC | PRN

## 2017-12-18 MED ORDER — DEXAMETHASONE SODIUM PHOSPHATE 4 MG/ML IJ SOLN
INTRAMUSCULAR | Status: DC | PRN
  Administered 2017-12-18: 10 mg via INTRAVENOUS

## 2017-12-18 MED ORDER — LIDOCAINE HCL (CARDIAC) PF 100 MG/5ML IV SOSY
PREFILLED_SYRINGE | INTRAVENOUS | Status: DC | PRN
  Administered 2017-12-18: 80 mg via INTRAVENOUS

## 2017-12-18 MED ORDER — BUPIVACAINE HCL (PF) 0.25 % IJ SOLN
INTRAMUSCULAR | Status: AC
  Filled 2017-12-18: qty 30

## 2017-12-18 MED ORDER — PROMETHAZINE HCL 25 MG/ML IJ SOLN
INTRAMUSCULAR | Status: AC
  Filled 2017-12-18: qty 1

## 2017-12-18 MED ORDER — FENTANYL CITRATE (PF) 100 MCG/2ML IJ SOLN
INTRAMUSCULAR | Status: DC | PRN
  Administered 2017-12-18: 100 ug via INTRAVENOUS
  Administered 2017-12-18: 50 ug via INTRAVENOUS
  Administered 2017-12-18 (×2): 100 ug via INTRAVENOUS

## 2017-12-18 MED ORDER — MIDAZOLAM HCL 5 MG/5ML IJ SOLN
INTRAMUSCULAR | Status: DC | PRN
  Administered 2017-12-18: 2 mg via INTRAVENOUS

## 2017-12-18 MED ORDER — PROPOFOL 10 MG/ML IV BOLUS
INTRAVENOUS | Status: DC | PRN
  Administered 2017-12-18: 200 mg via INTRAVENOUS

## 2017-12-18 MED ORDER — OXYCODONE HCL 5 MG PO TABS
ORAL_TABLET | ORAL | Status: AC
  Filled 2017-12-18: qty 1

## 2017-12-18 MED ORDER — PROMETHAZINE HCL 25 MG/ML IJ SOLN
6.2500 mg | INTRAMUSCULAR | Status: DC | PRN
  Administered 2017-12-18: 6.25 mg via INTRAVENOUS

## 2017-12-18 MED ORDER — LACTATED RINGERS IV SOLN
INTRAVENOUS | Status: DC
  Administered 2017-12-18: 13:00:00 via INTRAVENOUS

## 2017-12-18 MED ORDER — LACTATED RINGERS IV SOLN: INTRAVENOUS | Status: DC

## 2017-12-18 MED ORDER — ACETAMINOPHEN 10 MG/ML IV SOLN
1000.0000 mg | Freq: Once | INTRAVENOUS | Status: AC
  Administered 2017-12-18: 1000 mg via INTRAVENOUS

## 2017-12-18 MED ORDER — SODIUM CHLORIDE 0.9 % IR SOLN
Status: DC | PRN
  Administered 2017-12-18: 3000 mL

## 2017-12-18 MED ORDER — ROCURONIUM BROMIDE 100 MG/10ML IV SOLN
INTRAVENOUS | Status: DC | PRN
  Administered 2017-12-18: 50 mg via INTRAVENOUS

## 2017-12-18 MED ORDER — BUPIVACAINE HCL (PF) 0.25 % IJ SOLN
INTRAMUSCULAR | Status: DC | PRN
  Administered 2017-12-18: 8 mL

## 2017-12-18 MED ORDER — SUGAMMADEX SODIUM 200 MG/2ML IV SOLN
INTRAVENOUS | Status: AC
  Filled 2017-12-18: qty 2

## 2017-12-18 MED ORDER — ACETAMINOPHEN 10 MG/ML IV SOLN
INTRAVENOUS | Status: AC
  Administered 2017-12-18: 1000 mg via INTRAVENOUS
  Filled 2017-12-18: qty 100

## 2017-12-18 MED ORDER — HYDROMORPHONE HCL 1 MG/ML IJ SOLN
0.2500 mg | INTRAMUSCULAR | Status: DC | PRN
  Administered 2017-12-18: 0.25 mg via INTRAVENOUS
  Administered 2017-12-18: 0.5 mg via INTRAVENOUS
  Administered 2017-12-18: 0.25 mg via INTRAVENOUS

## 2017-12-18 MED ORDER — SODIUM CHLORIDE 0.9 % IJ SOLN
INTRAMUSCULAR | Status: AC
  Filled 2017-12-18: qty 50

## 2017-12-18 MED ORDER — HYDROMORPHONE HCL 1 MG/ML IJ SOLN
INTRAMUSCULAR | Status: AC
  Administered 2017-12-18: 0.25 mg via INTRAVENOUS
  Filled 2017-12-18: qty 1

## 2017-12-18 MED ORDER — TAPENTADOL HCL 75 MG PO TABS: 75.0000 mg | ORAL_TABLET | Freq: Four times a day (QID) | ORAL | 0 refills | Status: AC | PRN

## 2017-12-18 MED ORDER — SUGAMMADEX SODIUM 200 MG/2ML IV SOLN
INTRAVENOUS | Status: DC | PRN
  Administered 2017-12-18: 200 mg via INTRAVENOUS

## 2017-12-18 MED ORDER — MIDAZOLAM HCL 2 MG/2ML IJ SOLN
INTRAMUSCULAR | Status: AC
  Filled 2017-12-18: qty 2

## 2017-12-18 MED ORDER — FENTANYL CITRATE (PF) 100 MCG/2ML IJ SOLN
INTRAMUSCULAR | Status: AC
  Filled 2017-12-18: qty 2

## 2017-12-18 MED ORDER — OXYCODONE HCL 5 MG PO TABS
5.0000 mg | ORAL_TABLET | Freq: Once | ORAL | Status: AC
  Administered 2017-12-18: 5 mg via ORAL

## 2017-12-18 MED ORDER — ROPIVACAINE HCL 5 MG/ML IJ SOLN
INTRAMUSCULAR | Status: AC
  Filled 2017-12-18: qty 30

## 2017-12-18 MED ORDER — PROPOFOL 10 MG/ML IV BOLUS
INTRAVENOUS | Status: AC
  Filled 2017-12-18: qty 20

## 2017-12-18 MED ORDER — FENTANYL CITRATE (PF) 250 MCG/5ML IJ SOLN
INTRAMUSCULAR | Status: AC
  Filled 2017-12-18: qty 5

## 2017-12-18 SURGICAL SUPPLY — 84 items
APPLICATOR ARISTA FLEXITIP XL (MISCELLANEOUS) IMPLANT
APPLICATOR COTTON TIP 6 STRL (MISCELLANEOUS) ×1 IMPLANT
APPLICATOR COTTON TIP 6IN STRL (MISCELLANEOUS) ×2
BARRIER ADHS 3X4 INTERCEED (GAUZE/BANDAGES/DRESSINGS) IMPLANT
CABLE HIGH FREQUENCY MONO STRZ (ELECTRODE) ×2 IMPLANT
CANISTER SUCT 3000ML PPV (MISCELLANEOUS) ×2 IMPLANT
CATH FOLEY 3WAY  5CC 16FR (CATHETERS) ×1
CATH FOLEY 3WAY 5CC 16FR (CATHETERS) ×1 IMPLANT
CATH ROBINSON RED A/P 16FR (CATHETERS) IMPLANT
COVER BACK TABLE 60X90IN (DRAPES) IMPLANT
COVER TIP SHEARS 8 DVNC (MISCELLANEOUS) ×1 IMPLANT
COVER TIP SHEARS 8MM DA VINCI (MISCELLANEOUS) ×1
COVER WAND RF STERILE (DRAPES) ×2 IMPLANT
DECANTER SPIKE VIAL GLASS SM (MISCELLANEOUS) ×4 IMPLANT
DEFOGGER SCOPE WARMER CLEARIFY (MISCELLANEOUS) ×2 IMPLANT
DERMABOND ADVANCED (GAUZE/BANDAGES/DRESSINGS) ×1
DERMABOND ADVANCED .7 DNX12 (GAUZE/BANDAGES/DRESSINGS) ×1 IMPLANT
DRAPE ARM DVNC X/XI (DISPOSABLE) IMPLANT
DRAPE COLUMN DVNC XI (DISPOSABLE) ×1 IMPLANT
DRAPE DA VINCI XI ARM (DISPOSABLE)
DRAPE DA VINCI XI COLUMN (DISPOSABLE) ×1
DRSG OPSITE POSTOP 3X4 (GAUZE/BANDAGES/DRESSINGS) ×2 IMPLANT
DURAPREP 26ML APPLICATOR (WOUND CARE) ×2 IMPLANT
ELECT REM PT RETURN 15FT ADLT (MISCELLANEOUS) IMPLANT
FILTER SMOKE EVAC LAPAROSHD (FILTER) ×2 IMPLANT
FORCEPS CUTTING 33CM 5MM (CUTTING FORCEPS) IMPLANT
FORCEPS CUTTING 45CM 5MM (CUTTING FORCEPS) IMPLANT
GLOVE BIOGEL PI IND STRL 7.0 (GLOVE) ×5 IMPLANT
GLOVE BIOGEL PI INDICATOR 7.0 (GLOVE) ×5
GLOVE ECLIPSE 6.5 STRL STRAW (GLOVE) ×6 IMPLANT
GOWN STRL REUS W/TWL LRG LVL3 (GOWN DISPOSABLE) ×6 IMPLANT
HEMOSTAT ARISTA ABSORB 3G PWDR (MISCELLANEOUS) IMPLANT
IRRIG SUCT STRYKERFLOW 2 WTIP (MISCELLANEOUS) ×2
IRRIGATION SUCT STRKRFLW 2 WTP (MISCELLANEOUS) ×1 IMPLANT
LEGGING LITHOTOMY PAIR STRL (DRAPES) IMPLANT
NEEDLE INSUFFLATION 120MM (ENDOMECHANICALS) ×2 IMPLANT
NEEDLE INSUFFLATION 150MM (ENDOMECHANICALS) IMPLANT
NEEDLE SPNL 22GX3.5 QUINCKE BK (NEEDLE) ×2 IMPLANT
NS IRRIG 1000ML POUR BTL (IV SOLUTION) ×2 IMPLANT
OBTURATOR OPTICAL STANDARD 8MM (TROCAR)
OBTURATOR OPTICAL STND 8 DVNC (TROCAR)
OBTURATOR OPTICALSTD 8 DVNC (TROCAR) IMPLANT
OCCLUDER COLPOPNEUMO (BALLOONS) ×2 IMPLANT
PACK LAPAROSCOPY BASIN (CUSTOM PROCEDURE TRAY) ×2 IMPLANT
PACK ROBOT WH (CUSTOM PROCEDURE TRAY) ×2 IMPLANT
PACK ROBOTIC GOWN (GOWN DISPOSABLE) ×2 IMPLANT
PACK TRENDGUARD 450 HYBRID PRO (MISCELLANEOUS) IMPLANT
PAD PREP 24X48 CUFFED NSTRL (MISCELLANEOUS) ×2 IMPLANT
POSITIONER SURGICAL ARM (MISCELLANEOUS) IMPLANT
POUCH SPECIMEN RETRIEVAL 10MM (ENDOMECHANICALS) ×2 IMPLANT
PROTECTOR NERVE ULNAR (MISCELLANEOUS) ×4 IMPLANT
SCISSORS LAP 5X35 DISP (ENDOMECHANICALS) IMPLANT
SEAL CANN UNIV 5-8 DVNC XI (MISCELLANEOUS) IMPLANT
SEAL XI 5MM-8MM UNIVERSAL (MISCELLANEOUS)
SEALER VESSEL DA VINCI XI (MISCELLANEOUS) ×1
SEALER VESSEL EXT DVNC XI (MISCELLANEOUS) ×1 IMPLANT
SET BI-LUMEN FLTR TB AIRSEAL (TUBING) ×2 IMPLANT
SET CYSTO W/LG BORE CLAMP LF (SET/KITS/TRAYS/PACK) IMPLANT
SET IRRIG TUBING LAPAROSCOPIC (IRRIGATION / IRRIGATOR) ×2 IMPLANT
SET TRI-LUMEN FLTR TB AIRSEAL (TUBING) ×4 IMPLANT
SLEEVE XCEL OPT CAN 5 100 (ENDOMECHANICALS) ×2 IMPLANT
SOLUTION ELECTROLUBE (MISCELLANEOUS) IMPLANT
SUT VIC AB 0 CT1 36 (SUTURE) ×4 IMPLANT
SUT VICRYL 0 UR6 27IN ABS (SUTURE) ×2 IMPLANT
SUT VICRYL 4-0 PS2 18IN ABS (SUTURE) ×4 IMPLANT
SUT VLOC 180 0 9IN  GS21 (SUTURE)
SUT VLOC 180 0 9IN GS21 (SUTURE) IMPLANT
SYR CONTROL 10ML LL (SYRINGE) ×2 IMPLANT
SYS RETRIEVAL 5MM INZII UNIV (BASKET) ×2
SYSTEM RETRIEVL 5MM INZII UNIV (BASKET) ×1 IMPLANT
TIP RUMI ORANGE 6.7MMX12CM (TIP) IMPLANT
TIP UTERINE 5.1X6CM LAV DISP (MISCELLANEOUS) IMPLANT
TIP UTERINE 6.7X10CM GRN DISP (MISCELLANEOUS) IMPLANT
TIP UTERINE 6.7X6CM WHT DISP (MISCELLANEOUS) IMPLANT
TIP UTERINE 6.7X8CM BLUE DISP (MISCELLANEOUS) IMPLANT
TOWEL OR 17X26 10 PK STRL BLUE (TOWEL DISPOSABLE) ×4 IMPLANT
TRENDGUARD 450 HYBRID PRO PACK (MISCELLANEOUS)
TROCAR BALLN 12MMX100 BLUNT (TROCAR) IMPLANT
TROCAR OPTI TIP 5M 100M (ENDOMECHANICALS) ×2 IMPLANT
TROCAR PORT AIRSEAL 8X120 (TROCAR) ×2 IMPLANT
TROCAR XCEL DIL TIP R 11M (ENDOMECHANICALS) ×2 IMPLANT
TUBING INSUF HEATED (TUBING) ×2 IMPLANT
WARMER LAPAROSCOPE (MISCELLANEOUS) ×2 IMPLANT
WATER STERILE IRR 1000ML POUR (IV SOLUTION) ×2 IMPLANT

## 2017-12-18 NOTE — Anesthesia Procedure Notes (Signed)
Procedure Name: Intubation Date/Time: 12/18/2017 1:10 PM Performed by: Jonna Munro, CRNA Pre-anesthesia Checklist: Patient identified, Emergency Drugs available, Suction available, Patient being monitored and Timeout performed Patient Re-evaluated:Patient Re-evaluated prior to induction Oxygen Delivery Method: Circle system utilized Preoxygenation: Pre-oxygenation with 100% oxygen Induction Type: IV induction Ventilation: Mask ventilation without difficulty Laryngoscope Size: Glidescope and 3 Grade View: Grade I Tube type: Oral Number of attempts: 2 Airway Equipment and Method: Stylet and Video-laryngoscopy Placement Confirmation: ETT inserted through vocal cords under direct vision,  positive ETCO2 and breath sounds checked- equal and bilateral Secured at: 22 cm Tube secured with: Tape Dental Injury: Teeth and Oropharynx as per pre-operative assessment  Comments: DL  With Mac 3, unable to see cords, DL with glidescope, cords visualized, atraumatic intubation, VSS throughout intubation.

## 2017-12-18 NOTE — Transfer of Care (Signed)
Immediate Anesthesia Transfer of Care Note  Patient: [name]  Procedure(s) Performed: Diagnostic LAPAROSCOPY/Excision of OVARIAN Mass (Right )  Patient Location: PACU  Anesthesia Type:General  Level of Consciousness: awake, alert  and oriented  Airway & Oxygen Therapy: Patient Spontanous Breathing and Patient connected to face mask oxygen  Post-op Assessment: Report given to RN and Post -op Vital signs reviewed and stable  Post vital signs: Reviewed and stable  Last Vitals:  Vitals Value Taken Time  BP 130/68 12/18/2017  2:36 PM  Temp    Pulse 98 12/18/2017  2:38 PM  Resp 24 12/18/2017  2:38 PM  SpO2 100 % 12/18/2017  2:38 PM  Vitals shown include unvalidated device data.  Last Pain:  Vitals:   12/18/17 1111  TempSrc:   PainSc: 0-No pain         Complications: No apparent anesthesia complications

## 2017-12-18 NOTE — Anesthesia Postprocedure Evaluation (Signed)
Anesthesia Post Note  Patient: Teja Mann-Bailey  Procedure(s) Performed: Diagnostic LAPAROSCOPY/Excision of OVARIAN Mass (Right )     Patient location during evaluation: PACU Anesthesia Type: General Level of consciousness: awake and alert Pain management: pain level controlled Vital Signs Assessment: post-procedure vital signs reviewed and stable Respiratory status: spontaneous breathing, nonlabored ventilation, respiratory function stable and patient connected to nasal cannula oxygen Cardiovascular status: blood pressure returned to baseline and stable Postop Assessment: no apparent nausea or vomiting Anesthetic complications: no Comments: PO pain meds prior to discharge.     Last Vitals:  Vitals:   12/18/17 1530 12/18/17 1545  BP: 117/69 119/72  Pulse: 91 97  Resp: 14 18  Temp:  37.1 C  SpO2: 98% 94%    Last Pain:  Vitals:   12/18/17 1545  TempSrc:   PainSc: Asleep                 Effie Berkshire

## 2017-12-18 NOTE — Brief Op Note (Signed)
12/18/2017  2:46 PM  PATIENT:  [name]  42 y.o. female  PRE-OPERATIVE DIAGNOSIS:  Pelvic Pain, Right Ovarian Mass  POST-OPERATIVE DIAGNOSIS:  Pelvic Pain, Right Ovarian Mass  PROCEDURE:  Dx laparoscopy, excision of right ovarian mass  SURGEON:  Surgeon(s) and Role:    * Servando Salina, MD - Primary  PHYSICIAN ASSISTANT:   ASSISTANTS: Artelia Laroche, CNM   ANESTHESIA:   general FINDINGS; NL TUBES, Right ovarian mass, nl left ov, nl liver edge, nl appendix, nl ant cul de sac, nl uterus, right posterior cul de sac foci of puckering with small window and bowel pulled into this suggestive of deep endometriosis EBL:  10 mL   BLOOD ADMINISTERED:none  DRAINS: none   LOCAL MEDICATIONS USED:  MARCAINE     SPECIMEN:  Source of Specimen:  right ovarian mass  DISPOSITION OF SPECIMEN:  PATHOLOGY  COUNTS:  YES  TOURNIQUET:  * No tourniquets in log *  DICTATION: .Other Dictation: Dictation Number [id number]  PLAN OF CARE: Discharge to home after PACU  PATIENT DISPOSITION:  PACU - hemodynamically stable.   Delay start of Pharmacological VTE agent (>24hrs) due to surgical blood loss or risk of bleeding: no

## 2017-12-18 NOTE — Anesthesia Preprocedure Evaluation (Addendum)
Anesthesia Evaluation  Patient identified by MRN, date of birth, ID band Patient awake    Reviewed: Allergy & Precautions, NPO status , Patient's Chart, lab work & pertinent test results  Airway Mallampati: I  TM Distance: >3 FB Neck ROM: Full    Dental  (+) Teeth Intact, Dental Advisory Given   Pulmonary    breath sounds clear to auscultation       Cardiovascular negative cardio ROS   Rhythm:Regular Rate:Normal     Neuro/Psych  Headaches, Anxiety    GI/Hepatic Neg liver ROS, GERD  ,  Endo/Other  negative endocrine ROS  Renal/GU negative Renal ROS     Musculoskeletal negative musculoskeletal ROS (+)   Abdominal Normal abdominal exam  (+)   Peds  Hematology negative hematology ROS (+)   Anesthesia Other Findings   Reproductive/Obstetrics                            Anesthesia Physical Anesthesia Plan  ASA: II  Anesthesia Plan: General   Post-op Pain Management:    Induction: Intravenous  PONV Risk Score and Plan: 4 or greater and Ondansetron, Dexamethasone, Midazolam and Scopolamine patch - Pre-op  Airway Management Planned: Oral ETT  Additional Equipment: None  Intra-op Plan:   Post-operative Plan: Extubation in OR  Informed Consent: I have reviewed the patients History and Physical, chart, labs and discussed the procedure including the risks, benefits and alternatives for the proposed anesthesia with the patient or authorized representative who has indicated his/her understanding and acceptance.   Dental advisory given  Plan Discussed with: CRNA  Anesthesia Plan Comments:        Anesthesia Quick Evaluation

## 2017-12-19 ENCOUNTER — Encounter (HOSPITAL_COMMUNITY): Payer: Self-pay | Admitting: Obstetrics and Gynecology

## 2017-12-19 NOTE — Addendum Note (Signed)
Addendum  created 12/19/17 0622 by Lollie Sails, CRNA   Charge Capture section accepted

## 2017-12-19 NOTE — Op Note (Signed)
NAMEEMMALEA, TREANOR MEDICAL RECORD [id number] ACCOUNT [id number] DATE OF BIRTH:12-01-1975 FACILITY: [facility] LOCATION: [facility] PHYSICIAN:Candies Palm A. Nazifa Trinka, MD  OPERATIVE REPORT  DATE OF PROCEDURE:  12/18/2017  PREOPERATIVE DIAGNOSES:  Right ovarian mass, right lower quadrant pain.  POSTOPERATIVE DIAGNOSES:  Right ovarian mass, right lower quadrant pain.  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  PROCEDURES:  Diagnostic laparoscopy, excision of right ovarian mass.  ANESTHESIA:  General.  SURGEON:  Servando Salina, MD  ASSISTANT:  Artelia Laroche, CNM.  DESCRIPTION OF PROCEDURE:  Under adequate general anesthesia, the patient was placed in the dorsal lithotomy position.  She was sterilely prepped and draped in the usual fashion.  Bivalve speculum was placed in the vagina.  Single tooth tenaculum was placed in the anterior lip of the cervix.  An acorn cannula was introduced into the cervical os and attached to the tenaculum for manipulation of the uterus.  Bivalve speculum was then removed.  An indwelling Foley catheter was sterilely placed.   Attention was then turned to the abdomen.  Marcaine 0.25% was injected supraumbilical.  Vertical incision was made.  Veress needle was introduced, tested with normal saline; 2.5 liters of CO2 was insufflated.  Veress needle was then removed.  A 10 mm disposable trocar with sleeve was introduced into the abdomen without incident.  A lighted video laparoscope was inserted.   On inspection, there was no trauma to the abdomen on entry.  There was serous fluid noted in the pelvis.  Its etiology was unclear.  Normal liver edge was noted.  The patient was subsequently placed in Trendelenburg position.  Suprapubic incision was then made.  A 5 mm port was placed under direct visualization and a long probe was then used to inspect the pelvis.  Anterior cul-de-sac without any lesions.  Uterus was normal.  Left tube and ovary was normal.  Right tube  was normal.  Right ovary on the  distal portion had a hard mass about 2 cm suggestive of probably a fibroma.  In the posterior cul-de-sac, the bowel appeared to be pulled into the pocket of scar tissue and probably Allen-Masters window suggestive of probably a deep endometriosis.  No other endometriotic sites were noted.  In the left lower quadrant, incision was made after 0.25% Marcaine was injected and a 5 mm port was placed under direct visualization.  Using the hot scissors and a single tooth tenaculum, the mass off the right  ovary was removed.  The site was cauterized as the mass was being removed.  Once that was done, the camera was placed in the left lower quadrant and exchanged with the 5 mm camera.  In the umbilical site, the Endobag was then placed.  The specimen was placed in the bag, brought up into the umbilical site and the port was removed in an attempt to retrieve the bag with the specimen.  The specimen, which was partially clamped, resulted in the bag being torn and the specimen dropped back into the abdomen.   At that point, the port site was placed in the supraumbilical area and through that port site, the specimen was found and grasped.  Once it was re-grasped and stabilized a #5 Endobag was inserted and the specimen was then put into place in the bag, brought up into the field and the supraumbilical incision was extended under direct visualization resulting in the bag with the specimen being removed.  Once that was done, the fascial edge was identified at that incision and closed with 0 Vicryl running stitch.  The pelvis was again inspected.  The fluid in the posterior cul-de-sac was then aspirated.  Good hemostasis was noted on that ovary.  The suprapubic port site was removed.  The 5 port site was removed under direct visualization and the abdomen was deflated.  The incisions were then closed with 4-0 Vicryl sutures and the instruments in the vagina was removed as was a Foley  catheter.  SPECIMEN:  Right ovarian mass sent to pathology.  ESTIMATED BLOOD LOSS:  Minimal.  COMPLICATIONS:  None.  The patient tolerated the procedure well and was transferred to recovery room in stable condition.  AN/NUANCE  D:12/18/2017 T:12/19/2017 JOB:[id number]

## 2017-12-26 ENCOUNTER — Other Ambulatory Visit: Payer: Self-pay | Admitting: Obstetrics and Gynecology

## 2018-01-31 ENCOUNTER — Other Ambulatory Visit: Payer: Self-pay

## 2018-01-31 DIAGNOSIS — R531 Weakness: Secondary | ICD-10-CM | POA: Insufficient documentation

## 2018-01-31 DIAGNOSIS — J101 Influenza due to other identified influenza virus with other respiratory manifestations: Secondary | ICD-10-CM | POA: Insufficient documentation

## 2018-01-31 DIAGNOSIS — R509 Fever, unspecified: Secondary | ICD-10-CM | POA: Diagnosis present

## 2018-01-31 DIAGNOSIS — R5381 Other malaise: Secondary | ICD-10-CM | POA: Diagnosis not present

## 2018-01-31 LAB — BASIC METABOLIC PANEL
Anion gap: 7 (ref 5–15)
BUN: 8 mg/dL (ref 6–20)
CALCIUM: 8.3 mg/dL — AB (ref 8.9–10.3)
CO2: 28 mmol/L (ref 22–32)
CREATININE: 0.74 mg/dL (ref 0.44–1.00)
Chloride: 105 mmol/L (ref 98–111)
GFR calc Af Amer: >60 mL/min (ref >60)
GFR calc non Af Amer: >60 mL/min (ref >60)
Glucose, Bld: 100 mg/dL — ABNORMAL HIGH (ref 70–99)
Potassium: 3.9 mmol/L (ref 3.5–5.1)
Sodium: 140 mmol/L (ref 135–145)

## 2018-01-31 LAB — CBC
HCT: 39.8 % (ref 36.0–46.0)
Hemoglobin: 13.4 g/dL (ref 12.0–15.0)
MCH: 29.6 pg (ref 26.0–34.0)
MCHC: 33.7 g/dL (ref 30.0–36.0)
MCV: 87.9 fL (ref 80.0–100.0)
PLATELETS: 322 10*3/uL (ref 150–400)
RBC: 4.53 MIL/uL (ref 3.87–5.11)
RDW: 13.5 % (ref 11.5–15.5)
WBC: 7.1 10*3/uL (ref 4.0–10.5)
nRBC: 0 % (ref 0.0–0.2)

## 2018-01-31 LAB — INFLUENZA PANEL BY PCR (TYPE A & B)
Influenza A By PCR: POSITIVE — AB
Influenza B By PCR: NEGATIVE

## 2018-01-31 LAB — TROPONIN I: Troponin I: <0.03 ng/mL (ref <0.03)

## 2018-01-31 MED ORDER — KETOROLAC TROMETHAMINE 30 MG/ML IJ SOLN
15.0000 mg | Freq: Once | INTRAMUSCULAR | Status: AC
  Administered 2018-02-01: 15 mg via INTRAVENOUS
  Filled 2018-01-31: qty 1

## 2018-01-31 MED ORDER — ACETAMINOPHEN 500 MG PO TABS
1000.0000 mg | ORAL_TABLET | Freq: Once | ORAL | Status: AC
  Administered 2018-02-01: 1000 mg via ORAL
  Filled 2018-01-31: qty 2

## 2018-01-31 MED ORDER — OSELTAMIVIR PHOSPHATE 75 MG PO CAPS
75.0000 mg | ORAL_CAPSULE | Freq: Once | ORAL | Status: AC
  Administered 2018-02-01: 75 mg via ORAL
  Filled 2018-01-31: qty 1

## 2018-01-31 MED ORDER — SODIUM CHLORIDE 0.9 % IV BOLUS
1000.0000 mL | Freq: Once | INTRAVENOUS | Status: AC
  Administered 2018-01-31: 1000 mL via INTRAVENOUS

## 2018-01-31 NOTE — ED Triage Notes (Addendum)
Pt comes via POV from home with c/o fever and weakness. Pt states this started yesterday.  Pt reports 103 temp at home. Pt states she took OTC medication with no relief.  Pt states it all just hit her once. Pt denies any chest pain or SHOB. Pt states generalized aches all over. Pt states decreased intake.

## 2018-02-01 ENCOUNTER — Emergency Department
Admission: EM | Admit: 2018-02-01 | Discharge: 2018-02-01 | Disposition: A | Discharge status: Home / Self Care | Payer: BC Managed Care – PPO | Attending: Emergency Medicine | Admitting: Emergency Medicine

## 2018-02-01 DIAGNOSIS — J101 Influenza due to other identified influenza virus with other respiratory manifestations: Secondary | ICD-10-CM

## 2018-02-01 MED ORDER — KETOROLAC TROMETHAMINE 10 MG PO TABS: 10.0000 mg | ORAL_TABLET | Freq: Four times a day (QID) | ORAL | 0 refills | Status: DC | PRN

## 2018-02-01 MED ORDER — OSELTAMIVIR PHOSPHATE 75 MG PO CAPS: 75.0000 mg | ORAL_CAPSULE | Freq: Two times a day (BID) | ORAL | 0 refills | Status: AC

## 2018-02-01 MED ORDER — ONDANSETRON 4 MG PO TBDP: 4.0000 mg | ORAL_TABLET | Freq: Three times a day (TID) | ORAL | 0 refills | Status: DC | PRN

## 2018-02-01 NOTE — ED Provider Notes (Signed)
[facility] Emergency Department Provider Note  ____________________________________________   First MD Initiated Contact with Patient 02/01/18 0000     (approximate)  I have reviewed the triage vital signs and the nursing notes.   HISTORY  Chief Complaint Fever and Weakness   HPI [name] is a 42 y.o. female who self presents to the emergency department with 24 hours of fever, chills, and weakness.  She has had a dry cough.  She feels generalized malaise and myalgias.  Denies dysuria frequency or hesitancy.  Denies chest pain or shortness of breath.  Did not get a flu shot this year.  No sick contacts.  Patient's symptoms came on suddenly were severe and are only minimally improved with antipyretics.  Nothing particular seems to make them worse except they seem to be worse the later on in the day.    Past Medical History:  Diagnosis Date  . Acute recurrent maxillary sinusitis 03/01/2015  . AD (atopic dermatitis) 06/23/2013  . Anxiety   . Arthralgia of temporomandibular joint 12/19/2013  . Chicken pox   . Migraine headache   . Ovarian cyst 10-2014   right    Patient Active Problem List   Diagnosis Date Noted  . Abnormal uterine bleeding (AUB) 07/08/2016  . Dysmenorrhea 02/24/2016  . Anxiety 01/09/2015  . Allergic rhinitis 06/10/2013  . Acid reflux 05/11/2012  . Migraine without aura 09/25/2010  . Insomnia 09/25/2010    Past Surgical History:  Procedure Laterality Date  . LAPAROSCOPIC OVARIAN CYSTECTOMY Right 12/18/2017   Procedure: Diagnostic LAPAROSCOPY/Excision of OVARIAN Mass;  Surgeon: Servando Salina, MD;  Location: [facility];  Service: Gynecology;  Laterality: Right;  . TONSILLECTOMY  1980    Prior to Admission medications   Medication Sig Start Date End Date Taking? Authorizing Provider  ketorolac (TORADOL) 10 MG tablet Take 1 tablet (10 mg total) by mouth every 6 (six) hours as needed. 02/01/18   Darel Hong, MD    ondansetron (ZOFRAN ODT) 4 MG disintegrating tablet Take 1 tablet (4 mg total) by mouth every 8 (eight) hours as needed for nausea or vomiting. 02/01/18   Darel Hong, MD  oseltamivir (TAMIFLU) 75 MG capsule Take 1 capsule (75 mg total) by mouth 2 (two) times daily for 5 days. 02/01/18 02/06/18  Darel Hong, MD    Allergies Penicillins; Percocet [oxycodone-acetaminophen]; Naproxen; Ibuprofen; and Tramadol  Family History  Problem Relation Age of Onset  . Hyperlipidemia Mother   . Heart disease Father   . Diabetes Father   . COPD Father   . Arthritis Maternal Aunt     Social History Social History   Tobacco Use  . Smoking status: Never Smoker  . Smokeless tobacco: Never Used  Substance Use Topics  . Alcohol use: Yes    Alcohol/week: 1.0 standard drinks    Types: 1 Glasses of wine per week    Comment: occassionally  . Drug use: No    Review of Systems Constitutional: Positive for fevers and chills Eyes: No visual changes. ENT: No sore throat. Cardiovascular: Denies chest pain. Respiratory: Denies shortness of breath. Gastrointestinal: No abdominal pain.  No nausea, no vomiting.  No diarrhea.  No constipation. Genitourinary: Negative for dysuria. Musculoskeletal: Negative for back pain. Skin: Negative for rash. Neurological: Negative for headaches, focal weakness or numbness.   ____________________________________________   PHYSICAL EXAM:  VITAL SIGNS: ED Triage Vitals  Enc Vitals Group     BP 01/31/18 2224 135/79     Pulse Rate 01/31/18 2224 Marland Kitchen)  114     Resp --      Temp 01/31/18 2224 99.3 F (37.4 C)     Temp Source 01/31/18 2224 Oral     SpO2 01/31/18 2224 98 %     Weight 01/31/18 2225 173 lb (78.5 kg)     Height 01/31/18 2225 5\' 5"  (1.651 m)     Head Circumference --      Peak Flow --      Pain Score 01/31/18 2225 10     Pain Loc --      Pain Edu? --      Excl. in [place]? --     Constitutional: Alert and oriented x4 appears quite uncomfortable  shaking with chills in bed Eyes: PERRL EOMI. midrange and brisk Head: Atraumatic. Nose: No congestion/rhinnorhea. Mouth/Throat: No trismus uvula midline no pharyngeal erythema or exudate Neck: No stridor.  No meningismus Cardiovascular: Tachycardic rate, regular rhythm. Grossly normal heart sounds.  Good peripheral circulation. Respiratory: Normal respiratory effort.  No retractions. Lungs CTAB and moving good air Gastrointestinal: Soft nontender Musculoskeletal: No lower extremity edema   Neurologic:  Normal speech and language. No gross focal neurologic deficits are appreciated. Skin:  Skin is warm, dry and intact. No rash noted. Psychiatric: Mood and affect are normal. Speech and behavior are normal.    ____________________________________________   DIFFERENTIAL includes but not limited to  Viral syndrome, influenza, pneumonia, pyelonephritis, bacteremia ____________________________________________   LABS (all labs ordered are listed, but only abnormal results are displayed)  Labs Reviewed  BASIC METABOLIC PANEL - Abnormal; Notable for the following components:      Result Value   Glucose, Bld 100 (*)    Calcium 8.3 (*)    All other components within normal limits  INFLUENZA PANEL BY PCR (TYPE A & B) - Abnormal; Notable for the following components:   Influenza A By PCR POSITIVE (*)    All other components within normal limits  CBC  TROPONIN I  CBG MONITORING, ED    Lab work reviewed by me shows the patient is influenza A+ __________________________________________  EKG  EKG reviewed personally by me shows sinus tachycardia at 112 with rightward axis and normal intervals no blocks and no signs of acute ischemia ____________________________________________  RADIOLOGY   ____________________________________________   PROCEDURES  Procedure(s) performed: no  Procedures  Critical Care performed: no  ____________________________________________   INITIAL  IMPRESSION / ASSESSMENT AND PLAN / ED COURSE  Pertinent labs & imaging results that were available during my care of the patient were reviewed by me and considered in my medical decision making (see chart for details).   As part of my medical decision making, I reviewed the following data within the [facility] History obtained from family if available, nursing notes, old chart and ekg, as well as notes from prior ED visits.  Patient comes to the emergency department with less than 24 hours of shaking chills myalgias and dry cough.  She is influenza A positive here.  She reports an intolerance to ibuprofen but can take Toradol without difficulty.  Given a liter of IV fluids along with 15 mg of Toradol, a gram of Tylenol, and her first dose of Tamiflu.  We did discuss the risks and benefits of Tamiflu and given the severity of her symptoms and how early she is in the course she would like to try which I think is reasonable.  Strict return precautions have been given and I will prescribe her Zofran and Toradol  along with her Tamiflu.      ____________________________________________   FINAL CLINICAL IMPRESSION(S) / ED DIAGNOSES  Final diagnoses:  Influenza A      NEW MEDICATIONS STARTED DURING THIS VISIT:  Discharge Medication List as of 02/01/2018 12:12 AM    START taking these medications   Details  ketorolac (TORADOL) 10 MG tablet Take 1 tablet (10 mg total) by mouth every 6 (six) hours as needed., Starting Sun 02/01/2018, Print    ondansetron (ZOFRAN ODT) 4 MG disintegrating tablet Take 1 tablet (4 mg total) by mouth every 8 (eight) hours as needed for nausea or vomiting., Starting Sun 02/01/2018, Print    oseltamivir (TAMIFLU) 75 MG capsule Take 1 capsule (75 mg total) by mouth 2 (two) times daily for 5 days., Starting Sun 02/01/2018, Until Fri 02/06/2018, Print         Note:  This document was prepared using Dragon voice recognition software and may include  unintentional dictation errors.     Darel Hong, MD 02/05/18 [id number]

## 2018-02-01 NOTE — Discharge Instructions (Signed)
Unfortunately today you were positive for influenza A.  With the flu it's normal to be sick for a full 6-10 days.  Please make sure you remain well hydrated and take 1000mg  of tylenol up to 4 times a day as needed for fevers and chills.  Make sure everyone at home washes their hands as you're definitely contagious.  Return to the ED for any concerns such as worsening fatigue, if you cannot eat or drink, or for any other issues whatsoever.  It was a pleasure to take care of you today, and thank you for coming to our emergency department.  If you have any questions or concerns before leaving please ask the nurse to grab me and I'm more than happy to go through your aftercare instructions again.  If you were prescribed any opioid pain medication today such as Norco, Vicodin, Percocet, morphine, hydrocodone, or oxycodone please make sure you do not drive when you are taking this medication as it can alter your ability to drive safely.  If you have any concerns once you are home that you are not improving or are in fact getting worse before you can make it to your follow-up appointment, please do not hesitate to call 911 and come back for further evaluation.  Darel Hong, MD  Results for orders placed or performed during the hospital encounter of 56/25/63  Basic metabolic panel  Result Value Ref Range   Sodium 140 135 - 145 mmol/L   Potassium 3.9 3.5 - 5.1 mmol/L   Chloride 105 98 - 111 mmol/L   CO2 28 22 - 32 mmol/L   Glucose, Bld 100 (H) 70 - 99 mg/dL   BUN 8 6 - 20 mg/dL   Creatinine, Ser 0.74 0.44 - 1.00 mg/dL   Calcium 8.3 (L) 8.9 - 10.3 mg/dL   GFR calc non Af Amer >60 >60 mL/min   GFR calc Af Amer >60 >60 mL/min   Anion gap 7 5 - 15  CBC  Result Value Ref Range   WBC 7.1 4.0 - 10.5 K/uL   RBC 4.53 3.87 - 5.11 MIL/uL   Hemoglobin 13.4 12.0 - 15.0 g/dL   HCT 39.8 36.0 - 46.0 %   MCV 87.9 80.0 - 100.0 fL   MCH 29.6 26.0 - 34.0 pg   MCHC 33.7 30.0 - 36.0 g/dL   RDW 13.5 11.5 - 15.5  %   Platelets 322 150 - 400 K/uL   nRBC 0.0 0.0 - 0.2 %  Troponin I - ONCE - STAT  Result Value Ref Range   Troponin I <0.03 <0.03 ng/mL  Influenza panel by PCR (type A & B)  Result Value Ref Range   Influenza A By PCR POSITIVE (A) NEGATIVE   Influenza B By PCR NEGATIVE NEGATIVE

## 2018-07-16 IMAGING — US US PELVIS COMPLETE TRANSABD/TRANSVAG
1 series · 15 of 25 positions shown · non-contrast
Comparison: Pelvis ultrasound 03/19/2016

CLINICAL DATA: 41-year-old female with pelvic pain for 3 days.
Premenopausal. On birth control.



[Series 1: us pelvis complete transabd/transvag · 15 of 54 slices shown]
[im 1/54]
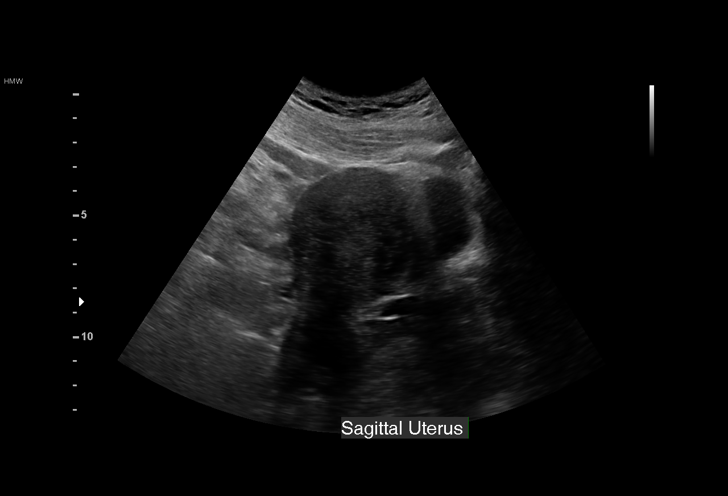
[im 5/54]
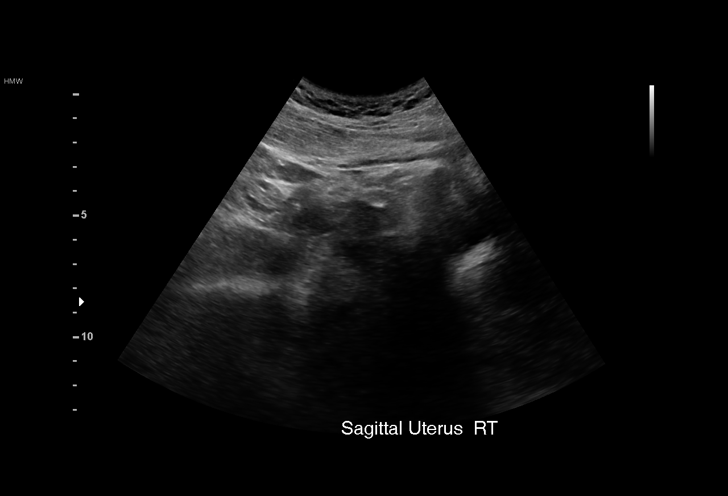
[im 9/54]
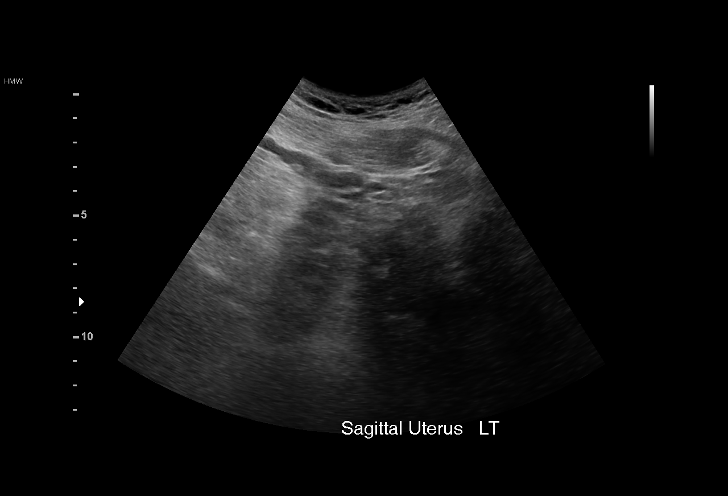
[im 12/54]
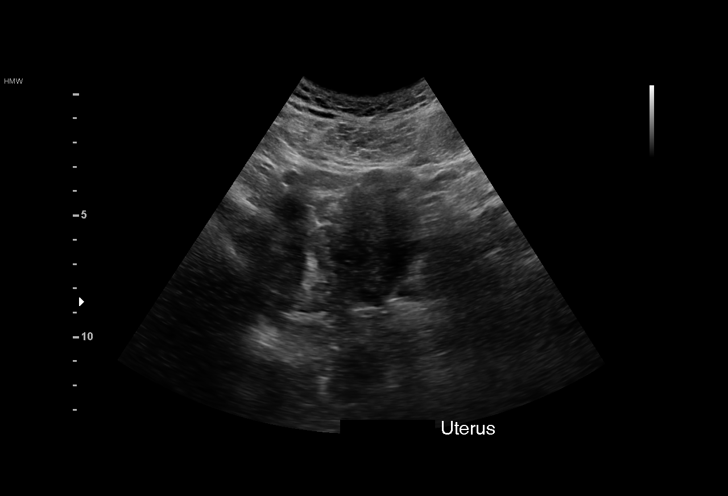
[im 16/54]
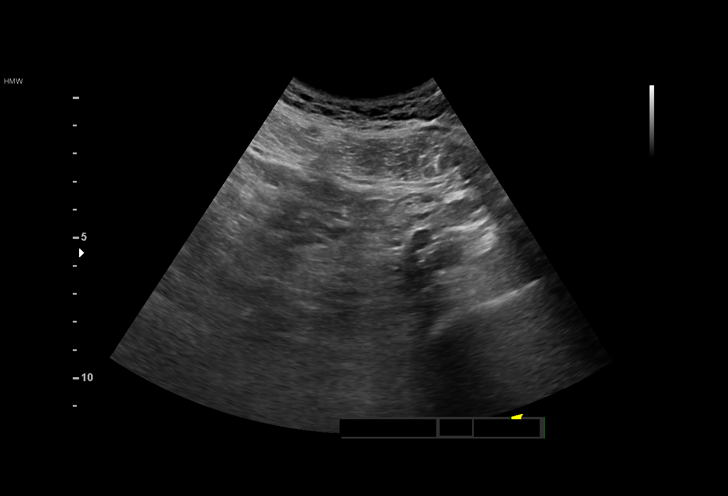
[im 20/54]
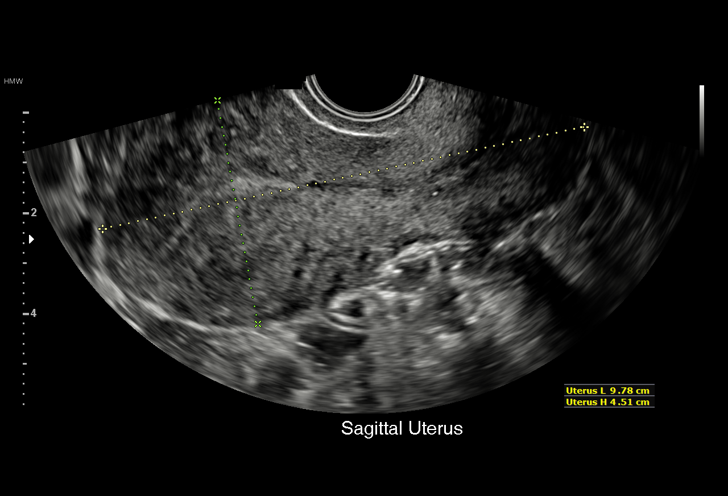
[im 23/54]
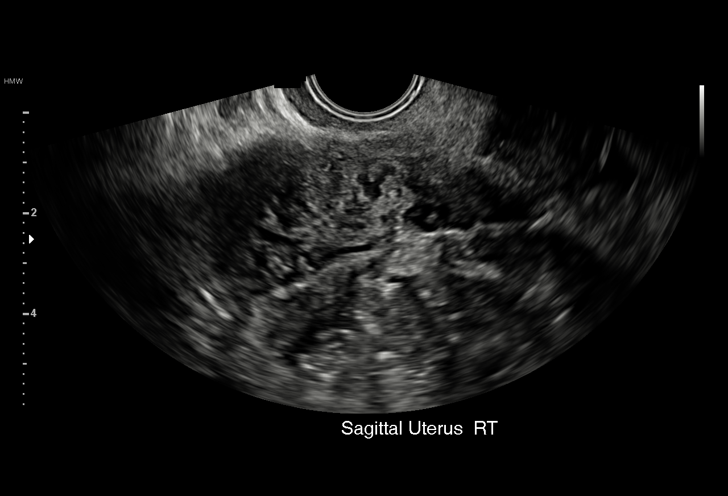
[im 27/54]
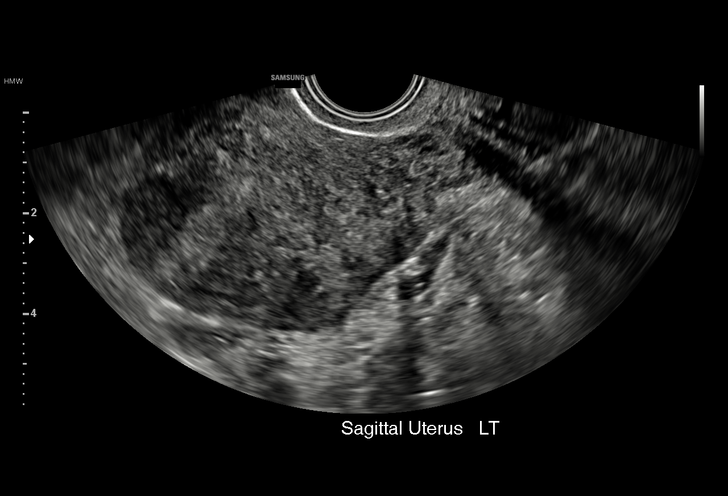
[im 31/54]
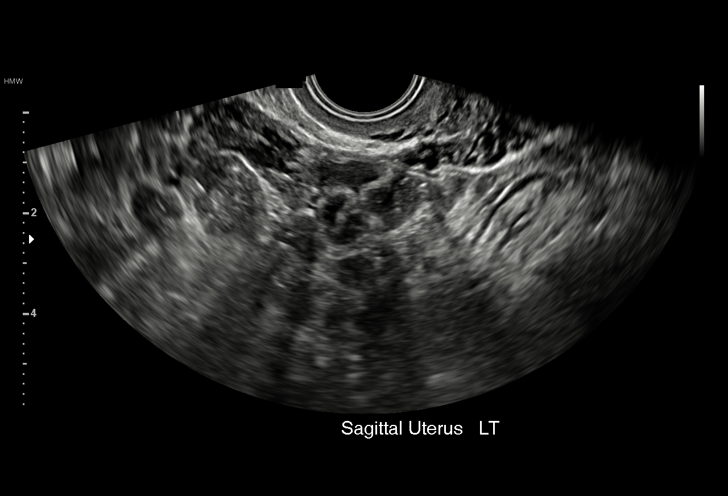
[im 34/54]
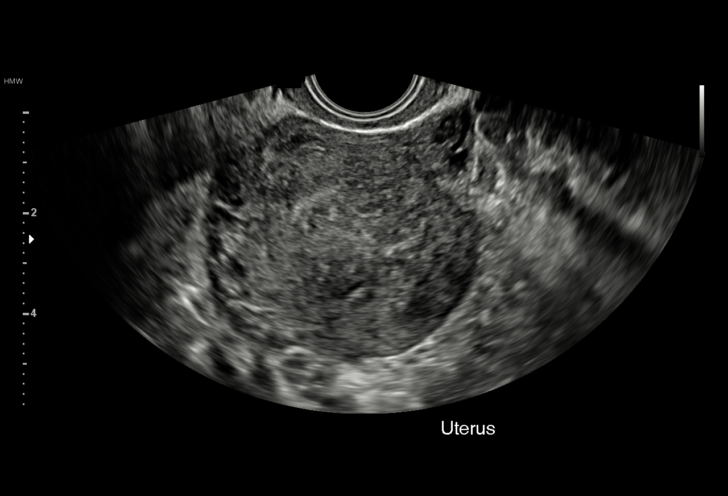
[im 38/54]
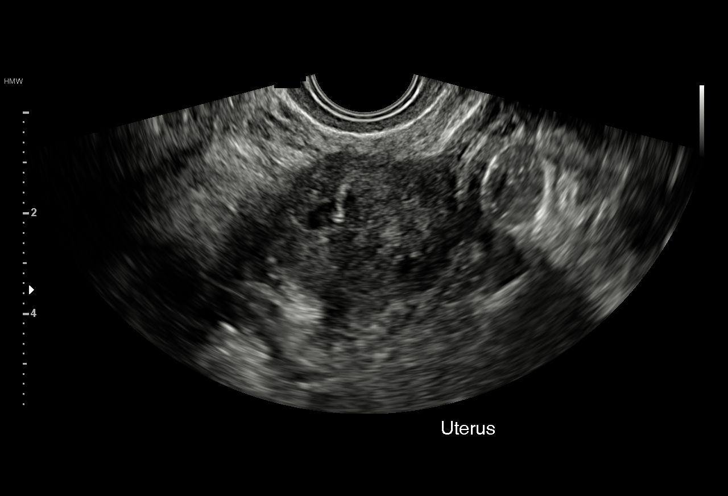
[im 42/54]
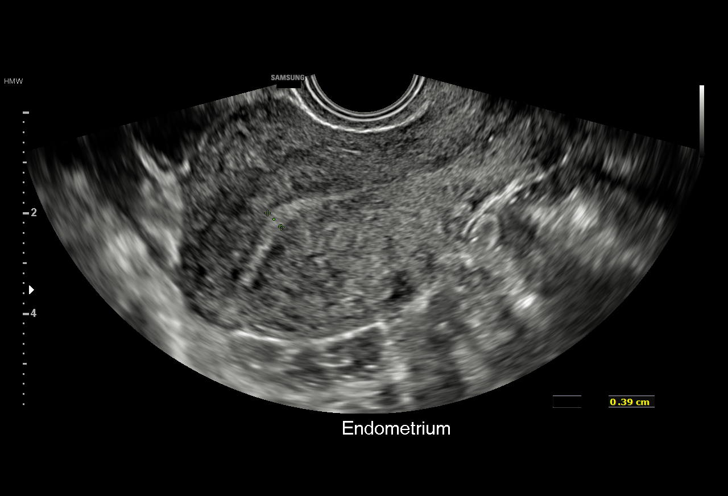
[im 45/54]
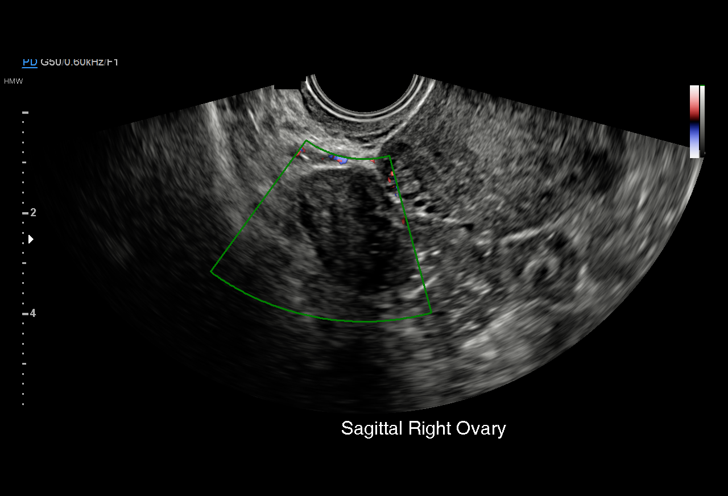
[im 49/54]
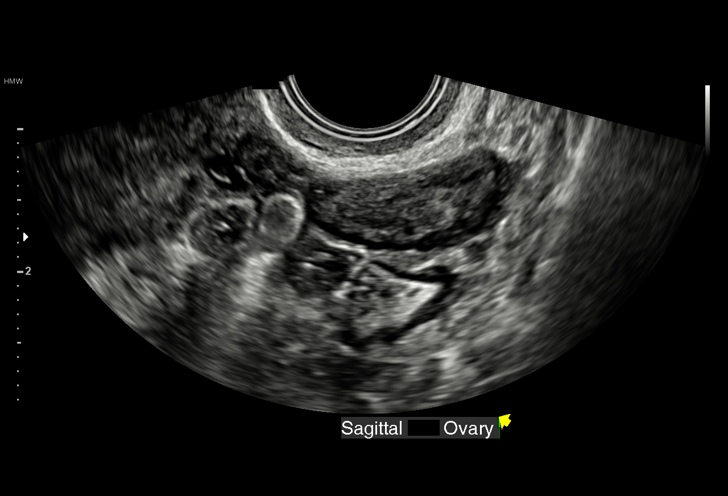
[im 54/54]
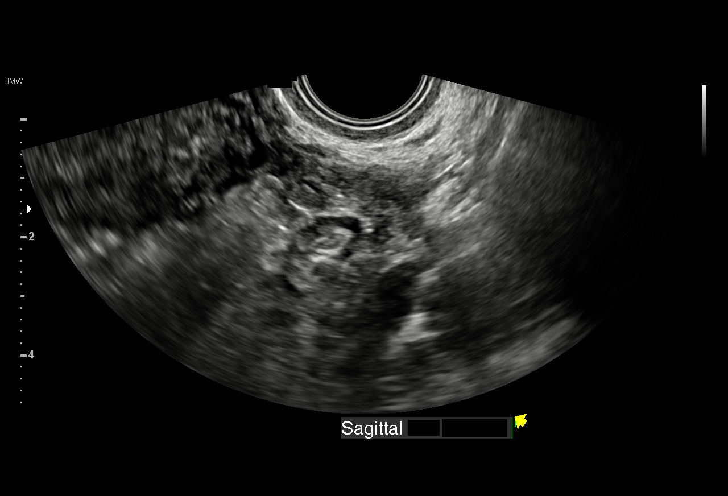

[15 of 25 positions shown; findings below may reference images not displayed]

FINDINGS: Uterus

Measurements: 9.8 x 4.5 x 5.5 cm (estimated at 9 x 4.9 x 5.8 cm
previously). No fibroids or other mass visualized.

Endometrium

Thickness: 4 mm.  No focal abnormality visualized.

Right ovary

Measurements: 2.7 x 1.5 x 1.5 cm. Normal appearance/no adnexal mass.

Left ovary

Measurements: 2.6 x 1.2 x 2.2 cm. Normal appearance/no adnexal mass.

Other findings

No abnormal free fluid.
IMPRESSION: Normal ultrasound appearance of the pelvis, stable since [DATE].

## 2018-11-22 DIAGNOSIS — E663 Overweight: Secondary | ICD-10-CM | POA: Insufficient documentation

## 2018-11-22 HISTORY — DX: Overweight: E66.3

## 2019-05-01 ENCOUNTER — Ambulatory Visit: Payer: PRIVATE HEALTH INSURANCE

## 2020-02-28 ENCOUNTER — Inpatient Hospital Stay
Admit: 2020-02-28 | Discharge: 2020-02-28 | Disposition: A | Discharge status: Home / Self Care | Payer: BLUE CROSS/BLUE SHIELD | Source: Home / Self Care

## 2020-02-28 DIAGNOSIS — U071 COVID-19: Secondary | ICD-10-CM

## 2020-02-28 DIAGNOSIS — J069 Acute upper respiratory infection, unspecified: Secondary | ICD-10-CM

## 2020-02-28 LAB — COVID-19 PCR: COVID-19 PCR: DETECTED — AB

## 2020-02-28 NOTE — ED Provider Notes
Joy Jones [facility]  Emergency Department Service Report    ED Provider Note   Rapid Medical Evaluation for Acute Viral Syndrome, including potential COVID-19  Arrived on 02/28/2020 at 7:46 AM by Car [5]        Symptoms include: no fever, no shortness of breath, no cough, no sore throat, no abdominal pain, no diarrhea, no nausea and no vomiting        Eyes:  Positive for conjunctivae normal.   no cervical adenopathy    Heart rate wnl           sounds wnl    no murmur    Resp effort wnl    no wheezes and no rales    no tenderness, no guarding and no rebound    Neuro oriented          Impression:     1. Acute upper respiratory infection    2. COVID-19               Joy Jones [facility]  Emergency Department Service Report    Joy Jones, a 44 y.o. female, presents with ILI/PUI ('' Exposure to COVID'') and Generalized Body Aches      Triage   Arrived at 7:46 AM  Arrived by Car [5]    ED Triage Vitals   Temp Temp Source BP Heart Rate Resp SpO2 O2 Device Pain Score Weight   02/28/20 0817 02/28/20 0817 02/28/20 0817 02/28/20 0817 02/28/20 0817 02/28/20 0817 02/28/20 0817 02/28/20 0817 02/28/20 0818   36.4 ???C (97.5 ???F) Temporal 151/105 (!) 104 18 97 % None (Room air) Zero 78.9 kg (174 lb)       Chief Complaint   Patient presents with   ??? ILI/PUI     '' Exposure to COVID''   ??? Generalized Body Aches             History   HPI    44y/o F no pmhx presents for covid exposure. Her daughter at home has had cough and fever for past 48 hours. She has felt some body aches that started about 2 days ago but denies fever, cough, sob, n/v/d, or other sx. She is fully vaccinated for covid but has not had a booster. Denies known sick contacts.     No past medical history on file.     No past surgical history on file.     Past Family History   family history is not on file.     Past Social History   she has no history on file for tobacco use, alcohol use, drug use, and sexual activity.     Review of Systems Constitutional: Negative for activity change, chills and fever.   HENT: Negative for ear pain, mouth sores and sore throat.    Eyes: Negative for pain and discharge.   Respiratory: Negative for cough and shortness of breath.    Cardiovascular: Negative for chest pain.   Gastrointestinal: Negative for abdominal pain, diarrhea, nausea and vomiting.   Genitourinary: Negative for dysuria and hematuria.   Musculoskeletal: Negative for neck pain.   Skin: Negative for rash.   Neurological: Negative for dizziness and syncope.   Psychiatric/Behavioral: Negative for agitation and confusion.       Physical Exam   Physical Exam  Constitutional:       Appearance: She is well-developed.   HENT:      Head: Normocephalic and atraumatic.      Mouth/Throat:  Mouth: Mucous membranes are moist.   Eyes:      Conjunctiva/sclera: Conjunctivae normal.      Pupils: Pupils are equal, round, and reactive to light.   Neck:      Thyroid: No thyromegaly.      Trachea: No tracheal deviation.   Cardiovascular:      Rate and Rhythm: Normal rate and regular rhythm.      Heart sounds: Normal heart sounds. No murmur heard.      Pulmonary:      Effort: Pulmonary effort is normal. No respiratory distress.      Breath sounds: No stridor. No wheezing or rales.   Abdominal:      General: Bowel sounds are normal. There is no distension.      Palpations: Abdomen is soft. There is no mass.      Tenderness: There is no abdominal tenderness. There is no guarding or rebound.   Musculoskeletal:         General: Normal range of motion.      Cervical back: Normal range of motion and neck supple.   Lymphadenopathy:      Cervical: No cervical adenopathy.   Skin:     General: Skin is warm and dry.      Capillary Refill: Capillary refill takes less than 2 seconds.      Coloration: Skin is not pale.      Findings: No erythema or rash.   Neurological:      General: No focal deficit present.      Mental Status: She is alert and oriented to person, place, and time. Cranial Nerves: No cranial nerve deficit.      Coordination: Coordination normal.   Psychiatric:         Behavior: Behavior normal.         Thought Content: Thought content normal.         Judgment: Judgment normal.         Medical Decision Making   44y/o F no pmhx presents for covid exposure. She is asymptomatic, normal vital signs. Well-appearing with normal physical exam. Her only sx is body aches. Plan for covid test and discharge with supportive management. Low suspicion for bacterial illness.       Chart Review   Previous medical records requested.  Pertinent items reviewed:     ED Course     Laboratory Results     Labs Reviewed   COVID-19 PCR, RESPIRATORY UPPER - Abnormal; Notable for the following components:       Result Value    COVID-19 PCR Detected (*)     All other components within normal limits    Narrative:     This test is intended for in vitro diagnostic use under FDA Emergency Use Authorization only. The [facility] is certified under the Clinical Laboratory Improvement Amendments of 1988 (CLIA-88) as qualified to perform high complexity clinical laboratory testing.      INFLUENZA A B RSV PCR, RESPIRATORY UPPER       Imaging Results     No orders to display       Consults         Progress Notes / Reassessments          ED Procedure Notes       Any ED procedures performed are documented on separate ED procedure notes.    Clinical Impression     1. Acute upper respiratory infection    2. COVID-19  Disposition and Follow-up   Disposition: Discharge [1]     No future appointments.    Follow up with:  No follow-up provider specified.    Return precautions are specified on After Visit Summary.    There are no discharge medications for this patient.      Orders Placed This Encounter   ??? COVID-19 PCR, Nasopharyngeal               Resident Signature              Claudette Laws., MD  Resident  02/28/20 [id number]    FACULTY ATTESTATION (resident):    I was present with the resident during the key/critical portions of this service.  I have discussed the management with the resident, have reviewed the resident note, and agree with the documented findings and plan of care.        Vena Austria., MD  02/28/20 1327

## 2020-02-29 LAB — Influenza A B RSV PCR: RSV PCR: NOT DETECTED

## 2020-02-29 NOTE — Progress Notes
Pos CV, forward to MD. Final result.  Routed to MD inbox.

## 2021-05-20 ENCOUNTER — Other Ambulatory Visit: Payer: Self-pay

## 2021-05-20 ENCOUNTER — Emergency Department
Admission: EM | Admit: 2021-05-20 | Discharge: 2021-05-20 | Disposition: A | Discharge status: Home / Self Care | Payer: PRIVATE HEALTH INSURANCE | Attending: Emergency Medicine | Admitting: Emergency Medicine

## 2021-05-20 ENCOUNTER — Emergency Department: Payer: PRIVATE HEALTH INSURANCE

## 2021-05-20 DIAGNOSIS — I10 Essential (primary) hypertension: Secondary | ICD-10-CM | POA: Insufficient documentation

## 2021-05-20 DIAGNOSIS — R0789 Other chest pain: Secondary | ICD-10-CM | POA: Insufficient documentation

## 2021-05-20 DIAGNOSIS — R000 Tachycardia, unspecified: Secondary | ICD-10-CM | POA: Insufficient documentation

## 2021-05-20 DIAGNOSIS — R519 Headache, unspecified: Secondary | ICD-10-CM

## 2021-05-20 DIAGNOSIS — R079 Chest pain, unspecified: Secondary | ICD-10-CM

## 2021-05-20 LAB — BASIC METABOLIC PANEL
Anion gap: 5 (ref 5–15)
BUN: 14 mg/dL (ref 6–20)
CO2: 28 mmol/L (ref 22–32)
Calcium: 9.4 mg/dL (ref 8.9–10.3)
Chloride: 108 mmol/L (ref 98–111)
Creatinine, Ser: 0.97 mg/dL (ref 0.44–1.00)
GFR, Estimated: >60 mL/min (ref >60)
Glucose, Bld: 148 mg/dL — ABNORMAL HIGH (ref 70–99)
Potassium: 3.6 mmol/L (ref 3.5–5.1)
Sodium: 141 mmol/L (ref 135–145)

## 2021-05-20 LAB — CBC
HCT: 49.2 % — ABNORMAL HIGH (ref 36.0–46.0)
Hemoglobin: 16.6 g/dL — ABNORMAL HIGH (ref 12.0–15.0)
MCH: 30.9 pg (ref 26.0–34.0)
MCHC: 33.7 g/dL (ref 30.0–36.0)
MCV: 91.4 fL (ref 80.0–100.0)
Platelets: 408 10*3/uL — ABNORMAL HIGH (ref 150–400)
RBC: 5.38 MIL/uL — ABNORMAL HIGH (ref 3.87–5.11)
RDW: 12.6 % (ref 11.5–15.5)
WBC: 10.1 10*3/uL (ref 4.0–10.5)
nRBC: 0 % (ref 0.0–0.2)

## 2021-05-20 LAB — TROPONIN I (HIGH SENSITIVITY): Troponin I (High Sensitivity): 4 ng/L (ref <18)

## 2021-05-20 LAB — HCG, QUANTITATIVE, PREGNANCY: hCG, Beta Chain, Quant, S: <1 m[IU]/mL (ref <5)

## 2021-05-20 MED ORDER — KETOROLAC TROMETHAMINE 30 MG/ML IJ SOLN
15.0000 mg | Freq: Once | INTRAMUSCULAR | Status: AC
  Administered 2021-05-20: 15 mg via INTRAVENOUS
  Filled 2021-05-20: qty 1

## 2021-05-20 MED ORDER — MAGNESIUM SULFATE 2 GM/50ML IV SOLN
2.0000 g | Freq: Once | INTRAVENOUS | Status: AC
  Administered 2021-05-20: 2 g via INTRAVENOUS
  Filled 2021-05-20: qty 50

## 2021-05-20 MED ORDER — PROCHLORPERAZINE EDISYLATE 10 MG/2ML IJ SOLN
10.0000 mg | Freq: Once | INTRAMUSCULAR | Status: AC
  Administered 2021-05-20: 10 mg via INTRAVENOUS
  Filled 2021-05-20: qty 2

## 2021-05-20 MED ORDER — LACTATED RINGERS IV BOLUS
1000.0000 mL | Freq: Once | INTRAVENOUS | Status: AC
  Administered 2021-05-20: 1000 mL via INTRAVENOUS
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
## 2021-05-20 NOTE — ED Triage Notes (Signed)
Pt states history of migraine headaches. Pt states she is currently having a migraine. Pt states she has also been having chest pain off and on today. Pt states has had an occipital nerve block in past for migraine. Pt states has been having nausea. Spoke with dr. Vladimir Crofts regarding orders, no new order for head ct received.  ?

## 2021-05-20 NOTE — ED Notes (Signed)
Patient provided with discharge instructions and follow-up information. Patient verbalized understanding. Patient taken out by wheelchair by husband. ?

## 2021-05-20 NOTE — ED Notes (Signed)
Patient states that her migraines have gotten worse since getting a nerve block.Patient also states that she has no "rescue" meds for her migraines. ?

## 2021-05-30 ENCOUNTER — Other Ambulatory Visit: Payer: Self-pay | Admitting: Neurology

## 2021-05-30 DIAGNOSIS — G43011 Migraine without aura, intractable, with status migrainosus: Secondary | ICD-10-CM

## 2021-05-30 DIAGNOSIS — M5481 Occipital neuralgia: Secondary | ICD-10-CM

## 2021-06-18 ENCOUNTER — Other Ambulatory Visit: Payer: PRIVATE HEALTH INSURANCE

## 2021-08-10 ENCOUNTER — Telehealth: Payer: Self-pay | Admitting: Family Medicine

## 2021-08-10 NOTE — Telephone Encounter (Signed)
Error

## 2021-08-13 ENCOUNTER — Ambulatory Visit (INDEPENDENT_AMBULATORY_CARE_PROVIDER_SITE_OTHER): Payer: PRIVATE HEALTH INSURANCE | Admitting: Family

## 2021-08-13 ENCOUNTER — Encounter: Payer: Self-pay | Admitting: Family

## 2021-08-13 VITALS — BP 124/84 | HR 119 | Temp 98.1°F | Resp 16 | Ht 65.0 in | Wt 188.4 lb

## 2021-08-13 DIAGNOSIS — R739 Hyperglycemia, unspecified: Secondary | ICD-10-CM

## 2021-08-13 DIAGNOSIS — I10 Essential (primary) hypertension: Secondary | ICD-10-CM

## 2021-08-13 DIAGNOSIS — E669 Obesity, unspecified: Secondary | ICD-10-CM

## 2021-08-13 DIAGNOSIS — R198 Other specified symptoms and signs involving the digestive system and abdomen: Secondary | ICD-10-CM

## 2021-08-13 DIAGNOSIS — R7989 Other specified abnormal findings of blood chemistry: Secondary | ICD-10-CM

## 2021-08-13 DIAGNOSIS — N9489 Other specified conditions associated with female genital organs and menstrual cycle: Secondary | ICD-10-CM

## 2021-08-13 DIAGNOSIS — N809 Endometriosis, unspecified: Secondary | ICD-10-CM | POA: Diagnosis not present

## 2021-08-13 DIAGNOSIS — E559 Vitamin D deficiency, unspecified: Secondary | ICD-10-CM

## 2021-08-13 DIAGNOSIS — R635 Abnormal weight gain: Secondary | ICD-10-CM

## 2021-08-13 DIAGNOSIS — G43011 Migraine without aura, intractable, with status migrainosus: Secondary | ICD-10-CM

## 2021-08-13 DIAGNOSIS — Z91018 Allergy to other foods: Secondary | ICD-10-CM

## 2021-08-13 DIAGNOSIS — Z1211 Encounter for screening for malignant neoplasm of colon: Secondary | ICD-10-CM | POA: Insufficient documentation

## 2021-08-13 DIAGNOSIS — Z13220 Encounter for screening for lipoid disorders: Secondary | ICD-10-CM

## 2021-08-13 DIAGNOSIS — D519 Vitamin B12 deficiency anemia, unspecified: Secondary | ICD-10-CM | POA: Diagnosis not present

## 2021-08-13 DIAGNOSIS — G43711 Chronic migraine without aura, intractable, with status migrainosus: Secondary | ICD-10-CM | POA: Insufficient documentation

## 2021-08-13 DIAGNOSIS — F411 Generalized anxiety disorder: Secondary | ICD-10-CM

## 2021-08-13 LAB — CBC WITH DIFFERENTIAL/PLATELET
Basophils Absolute: 0.1 10*3/uL (ref 0.0–0.1)
Basophils Relative: 0.8 % (ref 0.0–3.0)
Eosinophils Absolute: 0 10*3/uL (ref 0.0–0.7)
Eosinophils Relative: 0.2 % (ref 0.0–5.0)
HCT: 44.7 % (ref 36.0–46.0)
Hemoglobin: 15.1 g/dL — ABNORMAL HIGH (ref 12.0–15.0)
Lymphocytes Relative: 20.4 % (ref 12.0–46.0)
Lymphs Abs: 1.6 10*3/uL (ref 0.7–4.0)
MCHC: 33.8 g/dL (ref 30.0–36.0)
MCV: 91.3 fl (ref 78.0–100.0)
Monocytes Absolute: 0.5 10*3/uL (ref 0.1–1.0)
Monocytes Relative: 7.1 % (ref 3.0–12.0)
Neutro Abs: 5.4 10*3/uL (ref 1.4–7.7)
Neutrophils Relative %: 71.5 % (ref 43.0–77.0)
Platelets: 392 10*3/uL (ref 150.0–400.0)
RBC: 4.89 Mil/uL (ref 3.87–5.11)
RDW: 13.4 % (ref 11.5–15.5)
WBC: 7.6 10*3/uL (ref 4.0–10.5)

## 2021-08-13 LAB — MICROALBUMIN / CREATININE URINE RATIO
Creatinine,U: 186.8 mg/dL
Microalb Creat Ratio: 1.6 mg/g (ref 0.0–30.0)
Microalb, Ur: 2.9 mg/dL — ABNORMAL HIGH (ref 0.0–1.9)

## 2021-08-13 LAB — COMPREHENSIVE METABOLIC PANEL
ALT: 18 U/L (ref 0–35)
AST: 15 U/L (ref 0–37)
Albumin: 4.8 g/dL (ref 3.5–5.2)
Alkaline Phosphatase: 75 U/L (ref 39–117)
BUN: 8 mg/dL (ref 6–23)
CO2: 27 mEq/L (ref 19–32)
Calcium: 10.1 mg/dL (ref 8.4–10.5)
Chloride: 104 mEq/L (ref 96–112)
Creatinine, Ser: 0.82 mg/dL (ref 0.40–1.20)
GFR: 84.82 mL/min (ref >60.00)
Glucose, Bld: 128 mg/dL — ABNORMAL HIGH (ref 70–99)
Potassium: 3.9 mEq/L (ref 3.5–5.1)
Sodium: 140 mEq/L (ref 135–145)
Total Bilirubin: 0.6 mg/dL (ref 0.2–1.2)
Total Protein: 7.6 g/dL (ref 6.0–8.3)

## 2021-08-13 LAB — LIPID PANEL
Cholesterol: 237 mg/dL — ABNORMAL HIGH (ref 0–200)
HDL: 47.5 mg/dL (ref >39.00)
LDL Cholesterol: 165 mg/dL — ABNORMAL HIGH (ref 0–99)
NonHDL: 189.37
Total CHOL/HDL Ratio: 5
Triglycerides: 123 mg/dL (ref 0.0–149.0)
VLDL: 24.6 mg/dL (ref 0.0–40.0)

## 2021-08-13 LAB — VITAMIN D 25 HYDROXY (VIT D DEFICIENCY, FRACTURES): VITD: 45.51 ng/mL (ref 30.00–100.00)

## 2021-08-13 LAB — T4, FREE: Free T4: 0.69 ng/dL (ref 0.60–1.60)

## 2021-08-13 LAB — HEMOGLOBIN A1C: Hgb A1c MFr Bld: 6.5 % (ref 4.6–6.5)

## 2021-08-13 LAB — T3, FREE: T3, Free: 4.1 pg/mL (ref 2.3–4.2)

## 2021-08-13 LAB — TSH: TSH: 0.69 u[IU]/mL (ref 0.35–5.50)

## 2021-08-13 MED ORDER — GABAPENTIN 300 MG PO CAPS: ORAL_CAPSULE | ORAL | 3 refills | Status: AC

## 2021-08-13 MED ORDER — NORETHINDRONE ACETATE 5 MG PO TABS: 5.0000 mg | ORAL_TABLET | Freq: Every day | ORAL | Status: AC

## 2021-08-13 MED ORDER — TIZANIDINE HCL 4 MG PO TABS: ORAL_TABLET | ORAL | 0 refills | Status: DC

## 2021-08-13 MED ORDER — CLONIDINE HCL 0.1 MG PO TABS: ORAL_TABLET | ORAL | 3 refills | Status: DC

## 2021-08-13 NOTE — Assessment & Plan Note (Signed)
Ordered vitamin d pending results.   

## 2021-08-13 NOTE — Assessment & Plan Note (Signed)
Tizanidine prn   Pelvic floor exercises prn

## 2021-08-13 NOTE — Assessment & Plan Note (Signed)
Order cbc pending results. 

## 2021-08-13 NOTE — Assessment & Plan Note (Signed)
Lipid panel ordered pending results.   

## 2021-08-13 NOTE — Progress Notes (Signed)
Have pt make appt to discuss lab results

## 2021-08-13 NOTE — Assessment & Plan Note (Signed)
Order tsh pending results Work on diet and exercise Recommend my fitness pal and or weight watchers to track Consider wegovy vs mounjaro pending labs

## 2021-08-13 NOTE — Assessment & Plan Note (Signed)
Clonidine prn  Advised pt only >140/90

## 2021-08-13 NOTE — Assessment & Plan Note (Signed)
Continue nightly gabapentin

## 2021-08-13 NOTE — Assessment & Plan Note (Signed)
Work on diet and exercise. Ordering a1c pending results

## 2021-08-13 NOTE — Assessment & Plan Note (Signed)
Sensitivity to meats Will test for alpha gal
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
## 2021-08-13 NOTE — Assessment & Plan Note (Signed)
Cont f/u with neurology as scheduled 

## 2021-08-13 NOTE — Assessment & Plan Note (Signed)
Cont f/u with gyn as scheduled

## 2021-08-14 ENCOUNTER — Encounter: Payer: Self-pay | Admitting: Family

## 2021-08-14 ENCOUNTER — Other Ambulatory Visit: Payer: Self-pay | Admitting: Family

## 2021-08-14 ENCOUNTER — Telehealth: Payer: Self-pay | Admitting: Family

## 2021-08-14 ENCOUNTER — Telehealth (INDEPENDENT_AMBULATORY_CARE_PROVIDER_SITE_OTHER): Payer: PRIVATE HEALTH INSURANCE | Admitting: Family

## 2021-08-14 VITALS — BP 116/76 | HR 75 | Ht 65.0 in | Wt 188.0 lb

## 2021-08-14 DIAGNOSIS — E669 Obesity, unspecified: Secondary | ICD-10-CM

## 2021-08-14 DIAGNOSIS — R809 Proteinuria, unspecified: Secondary | ICD-10-CM

## 2021-08-14 DIAGNOSIS — E782 Mixed hyperlipidemia: Secondary | ICD-10-CM | POA: Diagnosis not present

## 2021-08-14 DIAGNOSIS — E119 Type 2 diabetes mellitus without complications: Secondary | ICD-10-CM

## 2021-08-14 MED ORDER — METFORMIN HCL 500 MG PO TABS: 500.0000 mg | ORAL_TABLET | Freq: Every day | ORAL | 1 refills | Status: DC

## 2021-08-14 MED ORDER — SEMAGLUTIDE(0.25 OR 0.5MG/DOS) 2 MG/3ML ~~LOC~~ SOPN: 0.2500 mg | PEN_INJECTOR | SUBCUTANEOUS | 2 refills | Status: DC

## 2021-08-14 MED ORDER — LOSARTAN POTASSIUM 25 MG PO TABS: ORAL_TABLET | ORAL | 1 refills | Status: DC

## 2021-08-14 MED ORDER — OZEMPIC (0.25 OR 0.5 MG/DOSE) 2 MG/3ML ~~LOC~~ SOPN: 0.2500 mg | PEN_INJECTOR | SUBCUTANEOUS | 2 refills | Status: DC

## 2021-08-14 MED ORDER — TIRZEPATIDE 2.5 MG/0.5ML ~~LOC~~ SOAJ: 2.5000 mg | SUBCUTANEOUS | 2 refills | Status: DC

## 2021-08-14 NOTE — Assessment & Plan Note (Signed)
Start losartan 25 mg once daily.

## 2021-08-14 NOTE — Assessment & Plan Note (Signed)
[place] on low cholesterol diet and exercise as tolerated Will repeat FASTING in three months

## 2021-08-14 NOTE — Telephone Encounter (Signed)
Patient called states that Ozempic is unavailable and would like to try something else. Please use the same [facility] as other RX's that were sent in today.

## 2021-08-14 NOTE — Patient Instructions (Addendum)
Recommend annual opthalmology exam to check for diabetic retinopathy.   Recommend you check your feet daily for signs for infection.   A referral was placed today. Please let us know if you have not heard back within 2 weeks about the referral.  Start metformin 500 mg once daily (for your new diagnosis of diabetes)  Start losartan 25 mg, but take 1/2 tablet once daily (this is to help protect your kidneys)  I will send in ozempic to see if this covered.  Due to recent changes in healthcare laws, you may see results of your imaging and/or laboratory studies on MyChart before I have had a chance to review them.  I understand that in some cases there may be results that are confusing or concerning to you. Please understand that not all results are received at the same time and often I may need to interpret multiple results in order to provide you with the best plan of care or course of treatment. Therefore, I ask that you please give me 2 business days to thoroughly review all your results before contacting my office for clarification. Should we see a critical lab result, you will be contacted sooner.   It was a pleasure seeing you today! Please do not hesitate to reach out with any questions and or concerns.  Regards,   Eugenia Pancoast FNP-C

## 2021-08-14 NOTE — Progress Notes (Signed)
MyChart Video Visit    Virtual Visit via Video Note   This visit type was conducted due to national recommendations for restrictions regarding the COVID-19 Pandemic (e.g. social distancing) in an effort to limit this patient's exposure and mitigate transmission in our community. This patient is at least at moderate risk for complications without adequate follow up. This format is felt to be most appropriate for this patient at this time. Physical exam was limited by quality of the video and audio technology used for the visit. CMA was able to get the patient set up on a video visit.  Patient location: Home. Patient and provider in visit Provider location: Office  I discussed the limitations of evaluation and management by telemedicine and the availability of in person appointments. The patient expressed understanding and agreed to proceed.  Visit Date: 08/14/2021  Today's healthcare provider: Eugenia Pancoast, FNP     Subjective:    Patient ID: [name], female    DOB: 1975/09/22, 46 y.o.   MRN: [id number]  Chief Complaint  Patient presents with   Results    HPI  Pt here today via video visit with concerns.   Positive micro-albumin: d/w pt what this means in detail. Pt is not on arb or ace.  Diabetes: new diagnosis. Pt does state she eats pretty well, trying to work on diet and exercise. Denies polydipsia polyphagia polyuria  Elevated lipids: pt was drinking coffee with a little cream in the am and oatmeal. Was not fasting. Does eat a lot of shrimp as well. Trying to work on diet and exercise.   Elevated hgn: very mild.   Lab Results  Component Value Date   HGBA1C 6.5 08/13/2021    Past Medical History:  Diagnosis Date   Acute recurrent maxillary sinusitis 03/01/2015   AD (atopic dermatitis) 06/23/2013   Anxiety    Arthralgia of temporomandibular joint 12/19/2013   Chicken pox    Migraine headache    Ovarian cyst 10-2014   right    Past Surgical  History:  Procedure Laterality Date   LAPAROSCOPIC OVARIAN CYSTECTOMY Right 12/18/2017   Procedure: Diagnostic LAPAROSCOPY/Excision of OVARIAN Mass;  Surgeon: Servando Salina, MD;  Location: [facility];  Service: Gynecology;  Laterality: Right;   TONSILLECTOMY  1980    Family History  Problem Relation Age of Onset   Hyperlipidemia Mother    Heart disease Father        died at age 36 had pacemaker and defib   Diabetes Father    COPD Father    Arthritis Maternal Aunt     Social History   Socioeconomic History   Marital status: Married    Spouse name: Not on file   Number of children: 1   Years of education: Not on file   Highest education level: Not on file  Occupational History    Comment: [employer]  Tobacco Use   Smoking status: Never   Smokeless tobacco: Never  Vaping Use   Vaping Use: Never used  Substance and Sexual Activity   Alcohol use: Yes    Alcohol/week: 1.0 standard drink of alcohol    Types: 1 Glasses of wine per week    Comment: occassionally   Drug use: No   Sexual activity: Yes    Partners: Male    Birth control/protection: Pill, OCP  Other Topics Concern   Not on file  Social History Narrative   Daughter age 28 tillaya   Social Determinants of Health  Financial Resource Strain: Not on file  Food Insecurity: Not on file  Transportation Needs: Not on file  Physical Activity: Not on file  Stress: Not on file  Social Connections: Not on file  Intimate Partner Violence: Not on file    Outpatient Medications Prior to Visit  Medication Sig Dispense Refill   ALPRAZolam (XANAX) 0.5 MG tablet Take 0.5 mg by mouth daily as needed.     cloNIDine (CATAPRES) 0.1 MG tablet Take one po qd prn high blood pressure >140/90 90 tablet 3   gabapentin (NEURONTIN) 300 MG capsule Take two tablets qhs 90 capsule 3   Galcanezumab-gnlm (EMGALITY) 120 MG/ML SOSY INJECT 120 MG UNDER THE SKIN EVERY 28 DAYS     norethindrone (AYGESTIN) 5 MG tablet  Take 1 tablet (5 mg total) by mouth daily.     promethazine (PHENERGAN) 25 MG tablet Take by mouth.     tiZANidine (ZANAFLEX) 4 MG tablet Take two pills a day prn muscle spasm 30 tablet 0   Ubrogepant 100 MG TABS Take by mouth.     ketorolac (TORADOL) 10 MG tablet Take 1 tablet (10 mg total) by mouth every 6 (six) hours as needed. (Patient not taking: Reported on 08/14/2021) 30 tablet 0   No facility-administered medications prior to visit.    Allergies  Allergen Reactions   Penicillins Anaphylaxis and Itching    Facial swelling Has patient had a PCN reaction causing immediate rash, facial/tongue/throat swelling, SOB or lightheadedness with hypotension: Yes Has patient had a PCN reaction causing severe rash involving mucus membranes or skin necrosis: No Has patient had a PCN reaction that required hospitalization: No Has patient had a PCN reaction occurring within the last 10 years: No If all of the above answers are "NO", then may proceed with Cephalosporin use.    Percocet [Oxycodone-Acetaminophen] Nausea And Vomiting   Naproxen Swelling    Swelling in fingers up to arm.   Ibuprofen Rash   Tramadol Rash    Headaches        Objective:    Physical Exam Constitutional:      General: She is not in acute distress.    Appearance: Normal appearance. She is not ill-appearing, toxic-appearing or diaphoretic.  Pulmonary:     Effort: Pulmonary effort is normal.  Neurological:     General: No focal deficit present.     Mental Status: She is alert and oriented to person, place, and time. Mental status is at baseline.  Psychiatric:        Mood and Affect: Mood normal.        Behavior: Behavior normal.        Thought Content: Thought content normal.        Judgment: Judgment normal.     BP 116/76   Pulse 75   Ht '5\' 5"'$  (1.651 m)   Wt 188 lb (85.3 kg)   BMI 31.28 kg/m  Wt Readings from Last 3 Encounters:  08/14/21 188 lb (85.3 kg)  08/13/21 188 lb 6 oz (85.4 kg)  05/20/21 175  lb (79.4 kg)       Assessment & Plan:   Problem List Items Addressed This Visit       Endocrine   Diabetes mellitus without complication ([place])    New diagnosis DM2 Start metformin 500 mg once daily.  Will try to send in rx for start ozempic as pt also interested in weight loss with diabetes management.  Working on diet and exercise with not much relief.  D/w pt referring to diabetic educator as well. Referral placed.  Yearly foot exam, and annual eye exam.        Relevant Medications   losartan (COZAAR) 25 MG tablet   metFORMIN (GLUCOPHAGE) 500 MG tablet     Other   Microalbuminuria - Primary    Start losartan 25 mg once daily.      Relevant Medications   losartan (COZAAR) 25 MG tablet    I have discontinued Puanani Mann-Bailey's ketorolac. I am also having her start on losartan and metFORMIN. Additionally, I am having her maintain her ALPRAZolam, Ubrogepant, promethazine, Emgality, norethindrone, gabapentin, tiZANidine, and cloNIDine.  Meds ordered this encounter  Medications   losartan (COZAAR) 25 MG tablet    Sig: Take 1/2 tablet once daily    Dispense:  90 tablet    Refill:  1    Order Specific Question:   Supervising Provider    Answer:   BEDSOLE, AMY E [[id number]]   metFORMIN (GLUCOPHAGE) 500 MG tablet    Sig: Take 1 tablet (500 mg total) by mouth daily.    Dispense:  90 tablet    Refill:  1    Order Specific Question:   Supervising Provider    Answer:   BEDSOLE, AMY E [[id number]]   DISCONTD: Semaglutide,0.25 or 0.'5MG'$ /DOS, (OZEMPIC, 0.25 OR 0.5 MG/DOSE,) 2 MG/3ML SOPN    Sig: Inject 0.25 mg into the skin once a week.    Dispense:  3 mL    Refill:  2    Order Specific Question:   Supervising Provider    Answer:   BEDSOLE, AMY E [[id number]]    I discussed the assessment and treatment plan with the patient. The patient was provided an opportunity to ask questions and all were answered. The patient agreed with the plan and demonstrated an understanding of the  instructions.   The patient was advised to call back or seek an in-person evaluation if the symptoms worsen or if the condition fails to improve as anticipated.  I provided 15 minutes of face-to-face time during this encounter.   Eugenia Pancoast, [place] at [place] [phone] (phone) [phone] (fax)  [place]

## 2021-08-14 NOTE — Telephone Encounter (Signed)
Pt is requesting to get a refill on RX Fluoco.01% Hydro 6% Tret.05 . To use topically every night at bebtime . Pt would like Rx to be sent to [facility] on [address] . Please advise # [phone]

## 2021-08-14 NOTE — Assessment & Plan Note (Signed)
New diagnosis DM2 Start metformin 500 mg once daily.  Will try to send in rx for start ozempic as pt also interested in weight loss with diabetes management.  Working on diet and exercise with not much relief. D/w pt referring to diabetic educator as well. Referral placed.  Yearly foot exam, and annual eye exam.

## 2021-08-15 ENCOUNTER — Other Ambulatory Visit: Payer: Self-pay | Admitting: Family

## 2021-08-15 DIAGNOSIS — E119 Type 2 diabetes mellitus without complications: Secondary | ICD-10-CM

## 2021-08-15 MED ORDER — SEMAGLUTIDE(0.25 OR 0.5MG/DOS) 2 MG/3ML ~~LOC~~ SOPN: 0.2500 mg | PEN_INJECTOR | SUBCUTANEOUS | 1 refills | Status: DC

## 2021-08-15 NOTE — Telephone Encounter (Signed)
Prior auth started and approved for Ozempic (0.25 or 0.5 MG/DOSE) '2MG'$ /3ML pen-injectors. [name] (Key: [id number])  This request has been approved using information available on the patient's profile. CaseId:[id number];Status:Approved;Review Type:Prior Auth;Coverage Start Date:07/16/2021;Coverage End Date:08/15/2022

## 2021-08-15 NOTE — Telephone Encounter (Signed)
Patient called to avoid any further confusion I am closing this message and documenting all further action in the open phone note.

## 2021-08-15 NOTE — Telephone Encounter (Addendum)
Patient called back states that she needs to have resent to [facility] needs to be 90 day and would like [place] to call to get auth processed. I have sent the new script in as requested.  Phone number for [facility]  [phone]  Patient would like for you to send my chart message once has been started.

## 2021-08-17 LAB — ALPHA-GAL PANEL
Allergen, Mutton, f88: <0.1 kU/L
Allergen, Pork, f26: <0.1 kU/L
Beef: <0.1 kU/L
CLASS: 0
CLASS: 0
Class: 0
GALACTOSE-ALPHA-1,3-GALACTOSE IGE*: <0.1 kU/L (ref <0.10)

## 2021-08-17 LAB — THYROID PEROXIDASE ANTIBODIES (TPO) (REFL): Thyroperoxidase Ab SerPl-aCnc: <1 IU/mL (ref <9)

## 2021-08-17 LAB — INTERPRETATION:

## 2021-08-20 ENCOUNTER — Ambulatory Visit: Payer: PRIVATE HEALTH INSURANCE | Admitting: Family

## 2021-11-11 ENCOUNTER — Encounter: Payer: Self-pay | Admitting: Family

## 2021-11-21 ENCOUNTER — Ambulatory Visit: Payer: PRIVATE HEALTH INSURANCE | Admitting: Family

## 2021-11-21 ENCOUNTER — Telehealth: Payer: Self-pay

## 2021-11-21 NOTE — Telephone Encounter (Signed)
[place] Night - Client Nonclinical Telephone Record  [facility] Client [facility] Night - Client Client Site [place] Provider AA - PHYSICIAN, Verita Schneiders- MD Contact Type Call Who Is Calling Patient / Member / Family / Caregiver Caller Name Maiyah Goyne Phone Number [phone] Patient Name [name] Patient DOB [birth year] Call Type Message Only Information Provided Reason for Call Request to Reschedule Office Appointment Initial Comment Caller states she needs to reschedule her appointment. Patient request to speak to RN No Disp. Time Disposition Final User 11/21/2021 4:57:08 AM General Information Provided Yes Gae Gallop Call Closed By: Gae Gallop Transaction Date/Time: 11/21/2021 4:54:46 AM (ET

## 2021-11-26 ENCOUNTER — Other Ambulatory Visit: Payer: Self-pay | Admitting: Family

## 2021-11-26 DIAGNOSIS — E119 Type 2 diabetes mellitus without complications: Secondary | ICD-10-CM

## 2021-11-27 ENCOUNTER — Ambulatory Visit (INDEPENDENT_AMBULATORY_CARE_PROVIDER_SITE_OTHER): Payer: PRIVATE HEALTH INSURANCE | Admitting: Family

## 2021-11-27 ENCOUNTER — Encounter: Payer: Self-pay | Admitting: Family

## 2021-11-27 VITALS — BP 124/76 | HR 90 | Temp 98.4°F | Resp 16 | Ht 65.0 in | Wt 178.5 lb

## 2021-11-27 DIAGNOSIS — R718 Other abnormality of red blood cells: Secondary | ICD-10-CM

## 2021-11-27 DIAGNOSIS — E119 Type 2 diabetes mellitus without complications: Secondary | ICD-10-CM | POA: Diagnosis not present

## 2021-11-27 LAB — COMPREHENSIVE METABOLIC PANEL
ALT: 14 U/L (ref 0–35)
AST: 17 U/L (ref 0–37)
Albumin: 4.7 g/dL (ref 3.5–5.2)
Alkaline Phosphatase: 64 U/L (ref 39–117)
BUN: 8 mg/dL (ref 6–23)
CO2: 27 mEq/L (ref 19–32)
Calcium: 9.5 mg/dL (ref 8.4–10.5)
Chloride: 103 mEq/L (ref 96–112)
Creatinine, Ser: 0.9 mg/dL (ref 0.40–1.20)
GFR: 75.7 mL/min (ref >60.00)
Glucose, Bld: 88 mg/dL (ref 70–99)
Potassium: 4.1 mEq/L (ref 3.5–5.1)
Sodium: 137 mEq/L (ref 135–145)
Total Bilirubin: 0.5 mg/dL (ref 0.2–1.2)
Total Protein: 7.7 g/dL (ref 6.0–8.3)

## 2021-11-27 LAB — CBC
HCT: 43.2 % (ref 36.0–46.0)
Hemoglobin: 14.8 g/dL (ref 12.0–15.0)
MCHC: 34.2 g/dL (ref 30.0–36.0)
MCV: 91.8 fl (ref 78.0–100.0)
Platelets: 350 10*3/uL (ref 150.0–400.0)
RBC: 4.71 Mil/uL (ref 3.87–5.11)
RDW: 12.9 % (ref 11.5–15.5)
WBC: 8.5 10*3/uL (ref 4.0–10.5)

## 2021-11-27 LAB — HEMOGLOBIN A1C: Hgb A1c MFr Bld: 5.7 % (ref 4.6–6.5)

## 2021-11-27 MED ORDER — SEMAGLUTIDE(0.25 OR 0.5MG/DOS) 2 MG/3ML ~~LOC~~ SOPN: PEN_INJECTOR | SUBCUTANEOUS | 1 refills | Status: AC

## 2021-11-27 NOTE — Patient Instructions (Addendum)
    Increase ozempic to 0.5 mg weekly   Stop by the lab prior to leaving today. I will notify you of your results once received.    Regards,   Eugenia Pancoast FNP-C
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
## 2021-12-02 ENCOUNTER — Encounter: Payer: Self-pay | Admitting: Family

## 2021-12-02 DIAGNOSIS — R718 Other abnormality of red blood cells: Secondary | ICD-10-CM | POA: Insufficient documentation

## 2021-12-02 NOTE — Assessment & Plan Note (Signed)
Ordering a1c pending results Increase semaglutide to 0.5 mg weekly for weight loss goal Work on diabetic diet and exercise as tolerated. Yearly foot exam, and annual eye exam.

## 2021-12-02 NOTE — Assessment & Plan Note (Signed)
Repeat cbc pending results 

## 2021-12-23 ENCOUNTER — Other Ambulatory Visit: Payer: Self-pay | Admitting: Family

## 2021-12-24 ENCOUNTER — Other Ambulatory Visit (HOSPITAL_COMMUNITY): Payer: Self-pay

## 2021-12-24 MED ORDER — ALPRAZOLAM 0.5 MG PO TABS
0.5000 mg | ORAL_TABLET | Freq: Every day | ORAL | 0 refills | Status: DC | PRN
  Filled 2021-12-24: qty 30, 30d supply, fill #0

## 2022-02-12 ENCOUNTER — Ambulatory Visit: Payer: PRIVATE HEALTH INSURANCE | Admitting: Family

## 2022-02-20 ENCOUNTER — Other Ambulatory Visit (HOSPITAL_COMMUNITY): Payer: Self-pay

## 2022-02-20 ENCOUNTER — Telehealth: Payer: Self-pay

## 2022-02-20 ENCOUNTER — Telehealth: Payer: Self-pay | Admitting: Family

## 2022-02-20 NOTE — Telephone Encounter (Signed)
Patient needs prior authorization for medication Semaglutide,0.25 or 0.'5MG'$ /DOS, 2 MG/3ML [place] #[id number] Lorina Rabon, [place] AT [place] Phone: [phone]  Fax: [phone]

## 2022-02-20 NOTE — Telephone Encounter (Signed)
Noted  

## 2022-02-20 NOTE — Telephone Encounter (Signed)
Pharmacy Patient Advocate Encounter   Received notification from [facility] that prior authorization for Ozempic '2mg'$ /36m is required/requested.  Per Test Claim: [facility]: prior aJosem Kaufmann Tricare: $15.00 copay   PA submitted on 02/20/22 to (ins) [facility] via CoverMyMeds Key [id number]  Received Prior Authorization response from [employer] Authorization has been DENIED because Drug is not covered by plan.  Phone# [phone] Placed a call to [facility] to let them know of the denial and of the Tricare coverage. Spoke with pharmacist and she stated the copay is $15.00 but they did not have the medication in stock.

## 2022-02-21 NOTE — Telephone Encounter (Signed)
PA resolved. Please see telephone encounter from 02/20/22 (Prior Auth Ozempic)

## 2022-03-12 ENCOUNTER — Ambulatory Visit: Payer: PRIVATE HEALTH INSURANCE | Admitting: Family

## 2022-03-28 ENCOUNTER — Ambulatory Visit: Payer: PRIVATE HEALTH INSURANCE | Admitting: Family

## 2022-04-18 ENCOUNTER — Telehealth: Payer: Self-pay

## 2022-04-18 ENCOUNTER — Telehealth: Payer: Self-pay | Admitting: Family

## 2022-04-18 DIAGNOSIS — E119 Type 2 diabetes mellitus without complications: Secondary | ICD-10-CM

## 2022-04-18 DIAGNOSIS — R809 Proteinuria, unspecified: Secondary | ICD-10-CM

## 2022-04-18 NOTE — Telephone Encounter (Signed)
Patient called in and stated that her metFORMIN (GLUCOPHAGE) 500 MG tablet, losartan (COZAAR) 25 MG tablet , and Semaglutide,0.25 or 0.5MG/DOS, 2 MG/3ML SOPN  to be a 90 day supply and sent over to [place], [place]. She is completely out of Metformin and needs the medication. Please advise. Thank you!

## 2022-04-19 NOTE — Telephone Encounter (Signed)
Spoke to Patient she states she does not know why but she is out of Metformin medication as of yesterday and she needs it called in. Patient states she does not need to be seen every 3 or 6 months. Patient is not happy and states she will check with her Insurance to see if she needs to have a office visit every 3 or 6 months because that is ridiculous. Patient states she wants a phone call from [facility] and she will be waiting on Tabitha to call her today.

## 2022-04-19 NOTE — Telephone Encounter (Signed)
I have not seen patient in over six months, and with these specific medications their are maintenance labs that are to be repeat at least every six months. You were seen prior to this in three months because we were getting your diabetes under control. Regardless of what insurance says this is provider specific, and most pcps will require the same type of follow up and can reconsider in future if stability is achieve and or no longer on medications requiring monitoring. I will refill medication once patient makes f/u.

## 2022-04-19 NOTE — Telephone Encounter (Signed)
See additional details on another telephone message.

## 2022-04-24 NOTE — Telephone Encounter (Signed)
Left message to return call to our office.  

## 2022-05-06 NOTE — Telephone Encounter (Signed)
Encounter has been addressed. Left a message and on MyChart to call and schedule an OV for Med f/u.

## 2022-05-06 NOTE — Telephone Encounter (Signed)
Not sure if this was updated for patient.

## 2022-07-13 ENCOUNTER — Other Ambulatory Visit: Payer: Self-pay | Admitting: Family

## 2022-07-13 DIAGNOSIS — R809 Proteinuria, unspecified: Secondary | ICD-10-CM

## 2022-07-25 ENCOUNTER — Other Ambulatory Visit: Payer: Self-pay

## 2022-07-25 ENCOUNTER — Emergency Department
Admission: EM | Admit: 2022-07-25 | Discharge: 2022-07-25 | Disposition: A | Discharge status: Home / Self Care | Payer: BLUE CROSS/BLUE SHIELD | Attending: Emergency Medicine | Admitting: Emergency Medicine

## 2022-07-25 ENCOUNTER — Emergency Department: Payer: BLUE CROSS/BLUE SHIELD

## 2022-07-25 DIAGNOSIS — M436 Torticollis: Secondary | ICD-10-CM | POA: Diagnosis not present

## 2022-07-25 DIAGNOSIS — W19XXXA Unspecified fall, initial encounter: Secondary | ICD-10-CM

## 2022-07-25 DIAGNOSIS — W06XXXA Fall from bed, initial encounter: Secondary | ICD-10-CM | POA: Insufficient documentation

## 2022-07-25 DIAGNOSIS — S0990XA Unspecified injury of head, initial encounter: Secondary | ICD-10-CM | POA: Diagnosis present

## 2022-07-25 MED ORDER — LIDOCAINE 5 % EX PTCH: 1.0000 | MEDICATED_PATCH | Freq: Two times a day (BID) | CUTANEOUS | 0 refills | Status: DC

## 2022-07-25 MED ORDER — LIDOCAINE HCL (PF) 1 % IJ SOLN
10.0000 mL | Freq: Once | INTRAMUSCULAR | Status: AC
  Administered 2022-07-25: 10 mL
  Filled 2022-07-25: qty 10

## 2022-07-25 MED ORDER — TIZANIDINE HCL 4 MG PO TABS: 4.0000 mg | ORAL_TABLET | Freq: Three times a day (TID) | ORAL | 0 refills | Status: AC | PRN

## 2022-07-25 NOTE — ED Provider Notes (Signed)
[facility] Provider Note    Event Date/Time   First MD Initiated Contact with Patient 07/25/22 [phone]     (approximate)   History   Fall (/)   HPI  [name] is a 47 y.o. female who presents to the ED for evaluation of Fall (/)   Patient presents to the ED for evaluation of head neck pain after accidental fall out of bed.  She reports a "normal" evening last night.  Husband is still awake around 10 or 11 PM when he heard a "thump" upstairs in their bedroom.  He called out to the patient and she responded, he found her on the ground just beside her side of the bed, awake and seemingly had struck her head on the bedside table.  No reported seizure activity and unlikely syncope.  Patient reports taking some Tylenol PM and then going back to sleep, awakening a few hours later around 2 or 3 AM with more severe pain on the right side of her neck causing her unable to even turn her head to the side.  No further trauma or injuries.  No pain beyond the right-sided head and neck.   Physical Exam   Triage Vital Signs: ED Triage Vitals [07/25/22 0342]  Enc Vitals Group     BP (!) 148/95     Pulse Rate (!) 107     Resp 18     Temp 98 F (36.7 C)     Temp Source Oral     SpO2 95 %     Weight 169 lb (76.7 kg)     Height 5\' 5"  (1.651 m)     Head Circumference      Peak Flow      Pain Score 10     Pain Loc      Pain Edu?      Excl. in GC?     Most recent vital signs: Vitals:   07/25/22 0342 07/25/22 0600  BP: (!) 148/95 (!) 144/94  Pulse: (!) 107 98  Resp: 18 18  Temp: 98 F (36.7 C) 98.2 F (36.8 C)  SpO2: 95% 96%    General: Awake, no distress.  CV:  Good peripheral perfusion.  Resp:  Normal effort.  Abd:  No distention.  MSK:  No deformity noted.  Tenderness and palpable muscular spasm to the right-sided trapezius musculature primarily, also extending to the right-sided paraspinal cervical and thoracic musculature.  No overlying signs  of trauma, open injury, laceration. Right arm is neurovascularly intact. Neuro:  No focal deficits appreciated. Cranial nerves II through XII intact 5/5 strength and sensation in all 4 extremities Other:     ED Results / Procedures / Treatments   Labs (all labs ordered are listed, but only abnormal results are displayed) Labs Reviewed - No data to display  EKG   RADIOLOGY CT head interpreted by me without evidence of acute intracranial pathology CT cervical spine interpreted by me without evidence of fracture or dislocation  Official radiology report(s): CT Cervical Spine Wo Contrast  Result Date: 07/25/2022 CLINICAL DATA:  47 year old female status post fall from bed. EXAM: CT CERVICAL SPINE WITHOUT CONTRAST TECHNIQUE: Multidetector CT imaging of the cervical spine was performed without intravenous contrast. Multiplanar CT image reconstructions were also generated. RADIATION DOSE REDUCTION: This exam was performed according to the departmental dose-optimization program which includes automated exposure control, adjustment of the mA and/or kV according to patient size and/or use of iterative reconstruction technique. COMPARISON:  Cervical spine CT 05/27/2016. Head CT today. FINDINGS: Alignment: Mild chronic reversal of cervical lordosis. Cervicothoracic junction alignment is within normal limits. Bilateral posterior element alignment is within normal limits. Skull base and vertebrae: Bone mineralization is within normal limits. Visualized skull base is intact. No atlanto-occipital dissociation. C1 and C2 appear intact and aligned. No acute osseous abnormality identified. Soft tissues and spinal canal: No prevertebral fluid or swelling. No visible canal hematoma. Negative visible noncontrast neck soft tissues. Disc levels: Chronic partially calcified posterior disc bulge or herniation at C5-C6 (series 6, image 26). Right paracentral appearance of this on axial images (series 8, image 52). Mild  chronic spinal stenosis suspected. No other significant cervical spine degeneration is evident. Upper chest: Negative. IMPRESSION: 1. No acute traumatic injury identified in the cervical spine. 2. 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 Electronically Signed   By: Odessa Fleming M.D.   On: 07/25/2022 04:13   CT Head Wo Contrast  Result Date: 07/25/2022 CLINICAL DATA:  47 year old female status post fall from bed. EXAM: CT HEAD WITHOUT CONTRAST TECHNIQUE: Contiguous axial images were obtained from the base of the skull through the vertex without intravenous contrast. RADIATION DOSE REDUCTION: This exam was performed according to the departmental dose-optimization program which includes automated exposure control, adjustment of the mA and/or kV according to patient size and/or use of iterative reconstruction technique. COMPARISON:  Brain MRI 02/20/2017.  Sinuses FINDINGS: Brain: Scaphocephaly redemonstrated, normal variant. Normal cerebral volume. No midline shift, ventriculomegaly, mass effect, evidence of mass lesion, intracranial hemorrhage or evidence of cortically based acute infarction. Gray-white matter differentiation is within normal limits throughout the brain. Vascular: No suspicious intracranial vascular hyperdensity. Skull: No fracture identified. Sinuses/Orbits: Visualized paranasal sinuses and mastoids are clear. Other: No orbit or scalp soft tissue injury identified. IMPRESSION: 1. No acute traumatic injury identified. 2.  Normal noncontrast Head CT. Electronically Signed   By: Odessa Fleming M.D.   On: 07/25/2022 04:09    PROCEDURES and INTERVENTIONS:  Procedures  Trigger point injection After discussing risks versus benefits, patient is agreeable to proceed with trigger point injection for treatment of acute muscular spasm. Risks discussed include worsening pain, infection, neurovascular damage and pneumothorax.  Point of  maximal tenderness was identified at 3 locations, right-sided paraspinal cervical and thoracic musculature as well as trapezius musculature. Overlying skin was cleaned with alcohol swabs. 1% lidocaine without epinephrine was incrementally injected, after confirming negative drawback, into this musculature. Total of injected. Well-tolerated without any apparent complications.   Medications  lidocaine (PF) (XYLOCAINE) 1 % injection 10 mL (10 mLs Infiltration Given by Other 07/25/22 0551)     IMPRESSION / MDM / ASSESSMENT AND PLAN / ED COURSE  I reviewed the triage vital signs and the nursing notes.  Differential diagnosis includes, but is not limited to, torticollis, muscular spasm, cervical fracture, intracranial hemorrhage, herniated disc  {Patient presents with symptoms of an acute illness or injury that is potentially life-threatening.  Pleasant 47 year old woman presents with increasing pain a few hours after falling out of bed, suspect muscular spasm and torticollis.  Reassuring exam without signs of neurologic deficits or severe trauma, the muscular spasm is noted in her hesitancy or inability to rotate head to the right is consistent with torticollis.  Imaging is reassuring with CT head and neck.  Performed trigger point injection with improvement of symptoms and she was discharged with lidocaine patches and muscle relaxers.   Clinical Course as  of 07/25/22 0709  Thu Jul 25, 2022  0522 Trigger point injection. X4 locations : x2 trap , x1 paraspinal cervical, x1 paraspinal thoracic. Right side [DS]    Clinical Course User Index [DS] Delton Prairie, MD     FINAL CLINICAL IMPRESSION(S) / ED DIAGNOSES   Final diagnoses:  Fall, initial encounter  Injury of head, initial encounter  Acute torticollis     Rx / DC Orders   ED Discharge Orders          Ordered    tiZANidine (ZANAFLEX) 4 MG tablet  Every 8 hours PRN        07/25/22 0532    lidocaine (LIDODERM) 5 %   Every 12 hours        07/25/22 0532             Note:  This document was prepared using Dragon voice recognition software and may include unintentional dictation errors.   Delton Prairie, MD 07/25/22 [phone]

## 2022-07-25 NOTE — Discharge Instructions (Addendum)
Please use lidocaine patches at your site of pain.  Apply 1 patch at a time, leave on for 12 hours, then remove for 12 hours.  12 hours on, 12 hours off.  Do not apply more than 1 patch at a time.  Heating pads or hot showers, gentle stretching exercises and range of motion

## 2022-07-25 NOTE — ED Notes (Signed)
Pt requesting an MRI "because I feel the pain going to my occipital nerve."

## 2022-07-25 NOTE — ED Triage Notes (Signed)
Pt reports she was sleeping when she rolled out of her bed. Pt reports struck her head on the nightstand on the way down. Denies LOC but reports dizziness after impact. Pt denies emesis after. No laceration or active bleeding noted on scalp. Pt arrives alert and oriented following commands. Pupils equal and reactive. Breathing unlabored speaking in full sentences with symmetric chest rise and fall.

## 2022-10-27 IMAGING — CR DG CHEST 2V
1 series · 2 of 2 positions shown · non-contrast
Comparison: None.

CLINICAL DATA: Chest pain

EXAM:
CHEST - 2 VIEW

[Series 1: dg chest 2 view · 0.14mm/px · 2 of 2 slices shown]
[im 1/2]
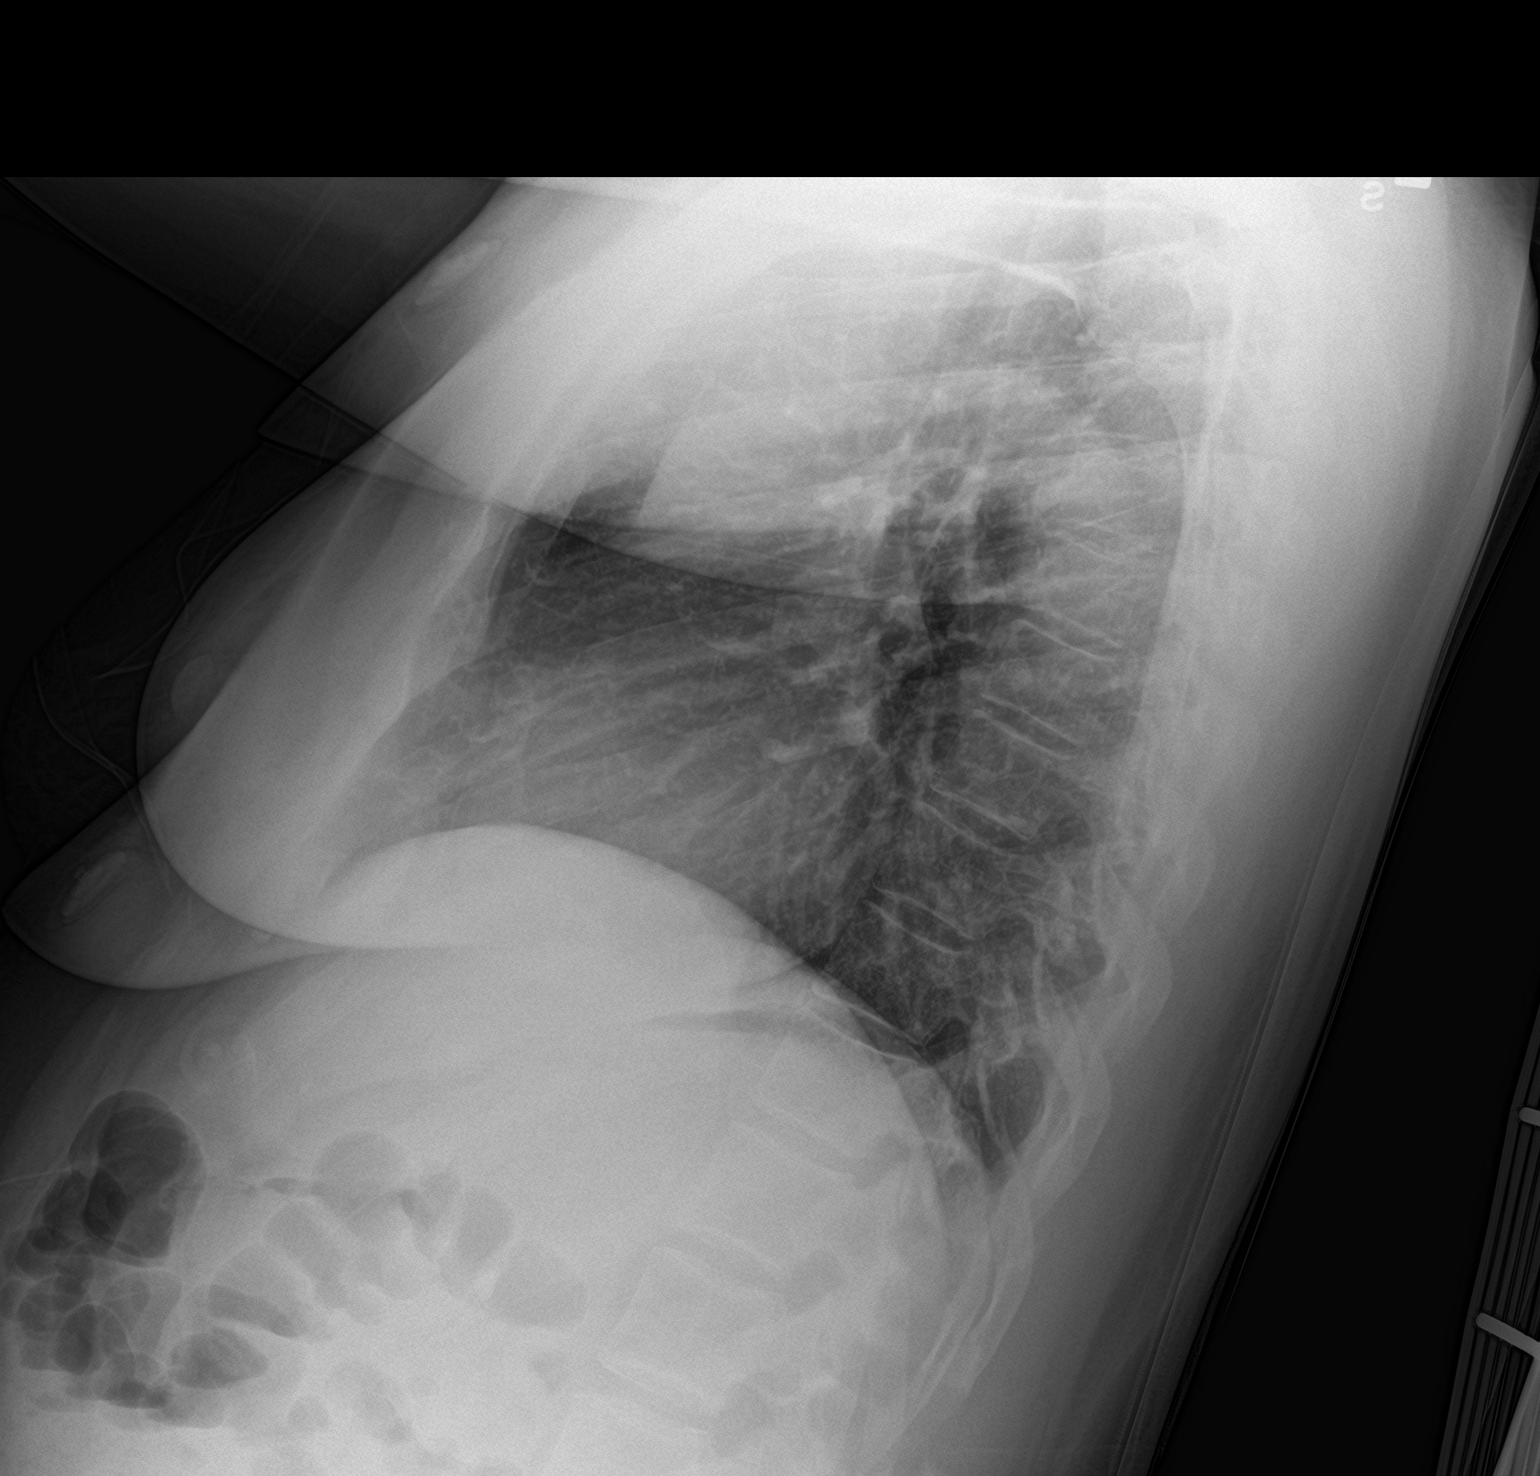
[im 2/2]
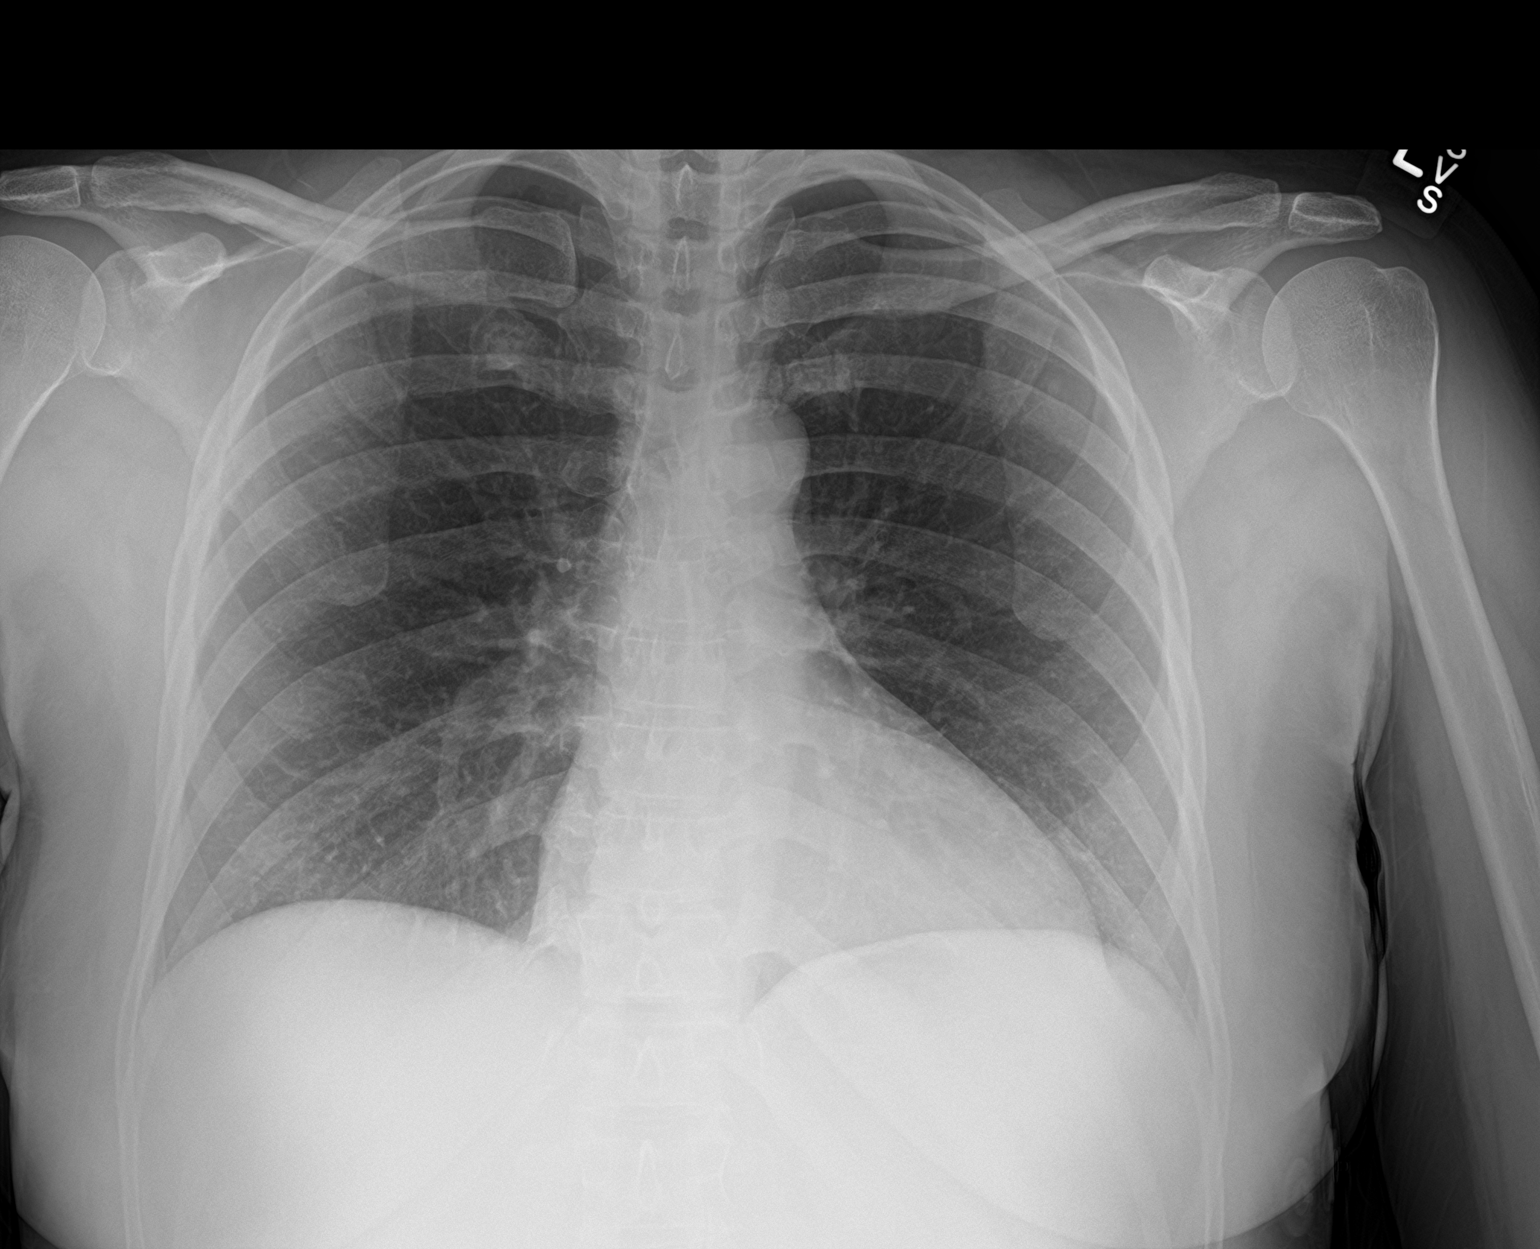

[2 of 2 positions shown; findings below may reference images not displayed]

FINDINGS: The heart size and mediastinal contours are within normal limits.
Both lungs are clear. The visualized skeletal structures are
unremarkable.
IMPRESSION: No active cardiopulmonary disease.

## 2022-11-21 ENCOUNTER — Telehealth: Payer: Self-pay

## 2022-11-21 NOTE — Telephone Encounter (Signed)
Returned patients call to schedule her colonoscopy. LVM for pt to return my call.   Thanks, [place], [place]

## 2022-11-21 NOTE — Telephone Encounter (Signed)
Patient called in to schedule her colonoscopy.

## 2022-12-02 ENCOUNTER — Other Ambulatory Visit: Payer: Self-pay

## 2022-12-02 ENCOUNTER — Telehealth: Payer: Self-pay

## 2022-12-02 DIAGNOSIS — Z1211 Encounter for screening for malignant neoplasm of colon: Secondary | ICD-10-CM

## 2022-12-02 MED ORDER — NA SULFATE-K SULFATE-MG SULF 17.5-3.13-1.6 GM/177ML PO SOLN: 354.0000 mL | Freq: Once | ORAL | 0 refills | Status: AC

## 2022-12-02 NOTE — Telephone Encounter (Signed)
Gastroenterology Pre-Procedure Review  Request Date: 01/01/2023 Requesting Physician: Dr. Tobi Bastos  PATIENT REVIEW QUESTIONS: The patient responded to the following health history questions as indicated:    1. Are you having any GI issues? no 2. Do you have a personal history of Polyps? no 3. Do you have a family history of Colon Cancer or Polyps? no 4. Diabetes Mellitus? no prediabetic  5. Joint replacements in the past 12 months?no 6. Major health problems in the past 3 months?no 7. Any artificial heart valves, MVP, or defibrillator?no    MEDICATIONS & ALLERGIES:    Patient reports the following regarding taking any anticoagulation/antiplatelet therapy:   Plavix, Coumadin, Eliquis, Xarelto, Lovenox, Pradaxa, Brilinta, or Effient? no Aspirin? no  Patient confirms/reports the following medications:  Current Outpatient Medications  Medication Sig Dispense Refill   ALPRAZolam (XANAX) 0.5 MG tablet Take 1 tablet (0.5 mg total) by mouth daily as needed for anxiety. 30 tablet 0   cloNIDine (CATAPRES) 0.1 MG tablet Take one po qd prn high blood pressure >140/90 90 tablet 3   gabapentin (NEURONTIN) 300 MG capsule Take two tablets qhs 90 capsule 3   Galcanezumab-gnlm (EMGALITY) 120 MG/ML SOSY INJECT 120 MG UNDER THE SKIN EVERY 28 DAYS     lidocaine (LIDODERM) 5 % Place 1 patch onto the skin every 12 (twelve) hours. Remove & Discard patch within 12 hours or as directed by MD 10 patch 0   losartan (COZAAR) 25 MG tablet Take 1/2 tablet once daily 90 tablet 1   metFORMIN (GLUCOPHAGE) 500 MG tablet TAKE 1 TABLET (500 MG TOTAL) BY MOUTH DAILY. 90 tablet 1   norethindrone (AYGESTIN) 5 MG tablet Take 1 tablet (5 mg total) by mouth daily.     promethazine (PHENERGAN) 25 MG tablet Take by mouth.     Semaglutide,0.25 or 0.5MG /DOS, 2 MG/3ML SOPN Inject 0.5 mg [place] weekly 9 mL 1   tiZANidine (ZANAFLEX) 4 MG tablet Take two pills a day prn muscle spasm 30 tablet 0   tiZANidine (ZANAFLEX) 4 MG tablet Take 1  tablet (4 mg total) by mouth every 8 (eight) hours as needed for muscle spasms. 30 tablet 0   Ubrogepant 100 MG TABS Take by mouth.     No current facility-administered medications for this visit.    Patient confirms/reports the following allergies:  Allergies  Allergen Reactions   Penicillins Anaphylaxis and Itching    Facial swelling Has patient had a PCN reaction causing immediate rash, facial/tongue/throat swelling, SOB or lightheadedness with hypotension: Yes Has patient had a PCN reaction causing severe rash involving mucus membranes or skin necrosis: No Has patient had a PCN reaction that required hospitalization: No Has patient had a PCN reaction occurring within the last 10 years: No If all of the above answers are "NO", then may proceed with Cephalosporin use.    Percocet [Oxycodone-Acetaminophen] Nausea And Vomiting   Naproxen Swelling    Swelling in fingers up to arm.   Ibuprofen Rash   Tramadol Rash    Headaches     No orders of the defined types were placed in this encounter.   AUTHORIZATION INFORMATION Primary Insurance: 1D#: Group #:  Secondary Insurance: 1D#: Group #:  SCHEDULE INFORMATION: Date:  Time: Location:

## 2022-12-02 NOTE — Telephone Encounter (Signed)
Marcelino Duster, Dr. Lance Coon called you back. She would want for you to call her back. Thank you.

## 2022-12-25 ENCOUNTER — Encounter: Payer: Self-pay | Admitting: Gastroenterology

## 2022-12-31 ENCOUNTER — Telehealth: Payer: Self-pay

## 2022-12-31 NOTE — Telephone Encounter (Signed)
Patient was calling to find out if she could have a light breakfast today since her colonoscopy is tomorrow. She states that the prep box states she can. Informed her we do not follow the instructions with the box but the instructions we mail to her. She states okay she understands now. She then asked if she could have hot tea. Informed her yes. She states that is all the questions she has.

## 2022-12-31 NOTE — Telephone Encounter (Signed)
Patient called in this morning she has a few questions about her procedure instructions. I gave her the number to endo and transfer her to [place].

## 2022-12-31 NOTE — Telephone Encounter (Signed)
Already took a message and documented

## 2023-01-01 ENCOUNTER — Encounter
Admission: RE | Disposition: A | Discharge status: Home / Self Care | Payer: Self-pay | Source: Home / Self Care | Attending: Gastroenterology

## 2023-01-01 ENCOUNTER — Ambulatory Visit: Payer: BC Managed Care – PPO | Admitting: Anesthesiology

## 2023-01-01 ENCOUNTER — Ambulatory Visit
Admission: RE | Admit: 2023-01-01 | Discharge: 2023-01-01 | Disposition: A | Discharge status: Home / Self Care | Payer: BC Managed Care – PPO | Attending: Gastroenterology | Admitting: Gastroenterology

## 2023-01-01 DIAGNOSIS — Z1211 Encounter for screening for malignant neoplasm of colon: Secondary | ICD-10-CM | POA: Diagnosis present

## 2023-01-01 DIAGNOSIS — F419 Anxiety disorder, unspecified: Secondary | ICD-10-CM | POA: Diagnosis not present

## 2023-01-01 HISTORY — PX: COLONOSCOPY WITH PROPOFOL: SHX5780

## 2023-01-01 LAB — POCT PREGNANCY, URINE: Preg Test, Ur: NEGATIVE

## 2023-01-01 SURGERY — COLONOSCOPY WITH PROPOFOL
Anesthesia: General

## 2023-01-01 MED ORDER — SODIUM CHLORIDE 0.9 % IV SOLN
INTRAVENOUS | Status: DC
  Administered 2023-01-01: 20 mL/h via INTRAVENOUS

## 2023-01-01 MED ORDER — PROPOFOL 500 MG/50ML IV EMUL
INTRAVENOUS | Status: DC | PRN
  Administered 2023-01-01: 150 ug/kg/min via INTRAVENOUS

## 2023-01-01 MED ORDER — LIDOCAINE HCL (CARDIAC) PF 100 MG/5ML IV SOSY
PREFILLED_SYRINGE | INTRAVENOUS | Status: DC | PRN
  Administered 2023-01-01: 50 mg via INTRAVENOUS

## 2023-01-01 MED ORDER — PROPOFOL 1000 MG/100ML IV EMUL
INTRAVENOUS | Status: AC
  Filled 2023-01-01: qty 100

## 2023-01-01 MED ORDER — PROPOFOL 10 MG/ML IV BOLUS
INTRAVENOUS | Status: DC | PRN
  Administered 2023-01-01: 80 mg via INTRAVENOUS

## 2023-01-01 MED ORDER — LIDOCAINE HCL (PF) 2 % IJ SOLN
INTRAMUSCULAR | Status: AC
  Filled 2023-01-01: qty 5

## 2023-01-01 MED ORDER — STERILE WATER FOR IRRIGATION IR SOLN
Status: DC | PRN
  Administered 2023-01-01: 60 mL

## 2023-01-01 NOTE — Anesthesia Postprocedure Evaluation (Signed)
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   Last Vitals:  Vitals:   01/01/23 1109 01/01/23 1119  BP: (!) 122/105 (!) 141/99  Pulse: 81 76  Resp: 20   Temp:    SpO2: 100% 100%    Last Pain:  Vitals:   01/01/23 1119  TempSrc:   PainSc: 0-No pain                 Foye Deer

## 2023-01-01 NOTE — Anesthesia Preprocedure Evaluation (Signed)
Anesthesia Evaluation  Patient identified by MRN, date of birth, ID band Patient awake    Reviewed: Allergy & Precautions, H&P , NPO status , Patient's Chart, lab work & pertinent test results  Airway Mallampati: II  TM Distance: >3 FB Neck ROM: full    Dental no notable dental hx.    Pulmonary neg pulmonary ROS   Pulmonary exam normal        Cardiovascular Exercise Tolerance: Good Normal cardiovascular exam     Neuro/Psych  PSYCHIATRIC DISORDERS Anxiety     negative neurological ROS     GI/Hepatic negative GI ROS, Neg liver ROS,,,  Endo/Other  negative endocrine ROS    Renal/GU negative Renal ROS  negative genitourinary   Musculoskeletal   Abdominal   Peds  Hematology negative hematology ROS (+)   Anesthesia Other Findings Past Medical History: 03/01/2015: Acute recurrent maxillary sinusitis 06/23/2013: AD (atopic dermatitis) No date: Anxiety 12/19/2013: Arthralgia of temporomandibular joint No date: Chicken pox No date: Migraine headache 10-2014: Ovarian cyst     Comment:  right  Past Surgical History: 12/18/2017: LAPAROSCOPIC OVARIAN CYSTECTOMY; Right     Comment:  Procedure: Diagnostic LAPAROSCOPY/Excision of OVARIAN               Mass;  Surgeon: Maxie Better, MD;  Location: [facility]               ORS;  Service: Gynecology;  Laterality: Right; 1980: TONSILLECTOMY  BMI    Body Mass Index: 27.14 kg/m      Reproductive/Obstetrics negative OB ROS                              Anesthesia Physical Anesthesia Plan  ASA: 1  Anesthesia Plan: General   Post-op Pain Management: Minimal or no pain anticipated   Induction: Intravenous  PONV Risk Score and Plan: Propofol infusion and TIVA  Airway Management Planned: Natural Airway  Additional Equipment:   Intra-op Plan:   Post-operative Plan:   Informed Consent: I have reviewed the patients History and Physical, chart,  labs and discussed the procedure including the risks, benefits and alternatives for the proposed anesthesia with the patient or authorized representative who has indicated his/her understanding and acceptance.     Dental Advisory Given  Plan Discussed with: CRNA and Surgeon  Anesthesia Plan Comments:          Anesthesia Quick Evaluation

## 2023-01-01 NOTE — Transfer of Care (Signed)
Immediate Anesthesia Transfer of Care Note  Patient: [name], Dr.  Nigel Bridgeman) Performed: COLONOSCOPY WITH PROPOFOL  Patient Location: PACU  Anesthesia Type:General  Level of Consciousness: sedated and responds to stimulation  Airway & Oxygen Therapy: Patient Spontanous Breathing  Post-op Assessment: Report given to RN and Post -op Vital signs reviewed and stable  Post vital signs: Reviewed and stable  Last Vitals:  Vitals Value Taken Time  BP 103/73 1059 01/01/2023  Temp    Pulse 80   Resp 16   SpO2 100%     Last Pain:  Vitals:   01/01/23 1019  TempSrc: Temporal  PainSc: 0-No pain         Complications: No notable events documented.

## 2023-01-01 NOTE — H&P (Signed)
[name], MD 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   Past Medical History:  Diagnosis Date   Acute recurrent maxillary sinusitis 03/01/2015   AD (atopic dermatitis) 06/23/2013   Anxiety    Arthralgia of temporomandibular joint 12/19/2013   Chicken pox    Migraine headache    Ovarian cyst 10-2014   right    Past Surgical History:  Procedure Laterality Date   LAPAROSCOPIC OVARIAN CYSTECTOMY Right 12/18/2017   Procedure: Diagnostic LAPAROSCOPY/Excision of OVARIAN Mass;  Surgeon: Maxie Better, MD;  Location: [facility] ORS;  Service: Gynecology;  Laterality: Right;   TONSILLECTOMY  1980    Prior to Admission medications   Medication Sig Start Date End Date Taking? Authorizing Provider  ALPRAZolam Prudy Feeler) 0.5 MG tablet Take 1 tablet (0.5 mg total) by mouth daily as needed for anxiety. 12/24/21   Mort Sawyers, FNP  cloNIDine (CATAPRES) 0.1 MG tablet Take one po qd prn high blood pressure >140/90 08/13/21   Mort Sawyers, FNP  gabapentin (NEURONTIN) 300 MG capsule Take two tablets qhs 08/13/21   Dugal, Tabitha, FNP  Galcanezumab-gnlm (EMGALITY) 120 MG/ML SOSY INJECT 120 MG UNDER THE SKIN EVERY 28 DAYS 03/07/21   [provider]  lidocaine (LIDODERM) 5 % Place 1 patch onto the skin every 12 (twelve) hours. Remove & Discard patch within 12 hours or as directed by MD 07/25/22 07/25/23  Delton Prairie, MD  losartan (COZAAR) 25 MG tablet Take 1/2 tablet once daily 08/14/21   Mort Sawyers, FNP  metFORMIN (GLUCOPHAGE) 500 MG tablet TAKE 1 TABLET (500 MG TOTAL) BY MOUTH DAILY. 11/27/21 05/26/22  Mort Sawyers, FNP  norethindrone (AYGESTIN) 5 MG tablet Take 1 tablet (5 mg total) by mouth daily. 08/13/21   Mort Sawyers, FNP  promethazine (PHENERGAN) 25 MG tablet Take by mouth. 01/08/20   [provider]  Semaglutide,0.25 or 0.5MG /DOS, 2 MG/3ML SOPN Inject 0.5 mg [place] weekly 11/27/21   Mort Sawyers, FNP  tiZANidine (ZANAFLEX) 4 MG tablet Take two pills a day prn muscle spasm 08/13/21   Mort Sawyers, FNP  tiZANidine (ZANAFLEX) 4 MG tablet Take 1 tablet (4 mg total) by mouth every 8 (eight) hours as needed for muscle spasms. 07/25/22 07/25/23  Delton Prairie, MD  Ubrogepant 100 MG TABS Take by mouth. 06/22/21   [provider]    Allergies as of 12/02/2022 - Review Complete 07/25/2022  Allergen Reaction Noted   Penicillins Anaphylaxis and Itching    Percocet [oxycodone-acetaminophen] Nausea And Vomiting 12/18/2017   Naproxen Swelling 05/29/2015   Ibuprofen Rash 05/29/2015   Tramadol Rash 01/11/2016    Family History  Problem Relation Age of Onset   Hyperlipidemia Mother    Heart disease Father        died at age 62 had pacemaker and defib   Diabetes Father    COPD Father    Arthritis Maternal Aunt     Social History   Socioeconomic History   Marital status: Married    Spouse name: Not on file   Number of children: 1   Years of education: Not on file   Highest education level: Not on file  Occupational History    Comment: [employer]  Tobacco Use   Smoking  status: Never   Smokeless tobacco: Never  Vaping Use   Vaping status: Never Used  Substance and Sexual Activity   Alcohol use: Yes    Alcohol/week: 1.0 standard drink of alcohol    Types: 1 Glasses of wine per week    Comment: occassionally   Drug use: No   Sexual activity: Yes    Partners: Male    Birth control/protection: Pill, OCP  Other Topics Concern   Not on file  Social History Narrative   Daughter age 101 tillaya   Social Determinants of Health   Financial Resource Strain: Not on file  Food Insecurity: Not on file  Transportation Needs: Not on file  Physical Activity: Not on file   Stress: Not on file  Social Connections: Not on file  Intimate Partner Violence: Not on file    Review of Systems: See HPI, otherwise negative ROS  Physical Exam: There were no vitals taken for this visit. General:   Alert,  pleasant and cooperative in NAD Head:  Normocephalic and atraumatic. Neck:  Supple; no masses or thyromegaly. Lungs:  Clear throughout to auscultation, normal respiratory effort.    Heart:  +S1, +S2, Regular rate and rhythm, No edema. Abdomen:  Soft, nontender and nondistended. Normal bowel sounds, without guarding, and without rebound.   Neurologic:  Alert and  oriented x4;  grossly normal neurologically.  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  Questions have been answered.  All parties agreeable.   [name], MD  01/01/2023, 10:02 AM

## 2023-01-01 NOTE — Op Note (Signed)
[facility] Gastroenterology Patient Name: [name] Procedure Date: 01/01/2023 9:58 AM MRN: [id number] Account #: [id number] Date of Birth: October 29, 1975 Admit Type: Outpatient Age: 47 Room: [facility] ENDO ROOM 3 Gender: Female Note Status: Finalized Instrument Name: Prentice Docker [id number] Procedure:             Colonoscopy Indications:           Screening for colorectal malignant neoplasm Providers:             [name] MD, MD Medicines:             Monitored Anesthesia Care Complications:         No immediate complications. Procedure:             Pre-Anesthesia Assessment:                        - Prior to the procedure, a History and Physical was                         performed, and patient medications, allergies and                         sensitivities were reviewed. The patient's tolerance                         of previous anesthesia was reviewed.                        - The risks and benefits of the procedure and the                         sedation options and risks were discussed with the                         patient. All questions were answered and informed                         consent was obtained.                        - ASA Grade Assessment: II - A patient with mild                         systemic disease.                        After obtaining informed consent, the colonoscope was                         passed under direct vision. Throughout the procedure,                         the patient's blood pressure, pulse, and oxygen                         saturations were monitored continuously. The                         Colonoscope was introduced through the anus and  advanced to the the cecum, identified by the                         appendiceal orifice. The colonoscopy was performed                         with ease. The patient tolerated the procedure well.                         The quality of the bowel  preparation was excellent.                         The ileocecal valve, appendiceal orifice, and rectum                         were photographed. Findings:      The entire examined colon appeared normal on direct and retroflexion       views. Impression:            - The entire examined colon is normal on direct and                         retroflexion views.                        - No specimens collected. Recommendation:        - Discharge patient to home (with escort).                        - Advance diet as tolerated.                        - Continue present medications.                        - Repeat colonoscopy in 10 years for screening                         purposes. Procedure Code(s):     --- Professional ---                        [phone], Colonoscopy, flexible; diagnostic, including                         collection of specimen(s) by brushing or washing, when                         performed (separate procedure) Diagnosis Code(s):     --- Professional ---                        Z12.11, Encounter for screening for malignant neoplasm                         of colon CPT copyright 2022 American [employer] Association. All rights reserved. The codes documented in this report are preliminary and upon coder review may  be revised to meet current compliance requirements. [name], MD [name] MD, MD 01/01/2023 10:57:00 AM This report has been signed electronically. Number of Addenda: 0 Note Initiated On: 01/01/2023 9:58  AM Scope Withdrawal Time: 0 hours 6 minutes 39 seconds  Total Procedure Duration: 0 hours 9 minutes 4 seconds  Estimated Blood Loss:  Estimated blood loss: none.      [facility]

## 2023-01-02 ENCOUNTER — Other Ambulatory Visit: Payer: Self-pay

## 2023-01-02 ENCOUNTER — Telehealth: Payer: Self-pay | Admitting: Emergency Medicine

## 2023-01-02 ENCOUNTER — Encounter: Payer: Self-pay | Admitting: Gastroenterology

## 2023-01-02 ENCOUNTER — Emergency Department: Payer: BC Managed Care – PPO

## 2023-01-02 ENCOUNTER — Telehealth: Payer: Self-pay

## 2023-01-02 ENCOUNTER — Emergency Department
Admission: EM | Admit: 2023-01-02 | Discharge: 2023-01-02 | Disposition: A | Discharge status: Home / Self Care | Payer: BC Managed Care – PPO | Attending: Emergency Medicine | Admitting: Emergency Medicine

## 2023-01-02 DIAGNOSIS — S301XXA Contusion of abdominal wall, initial encounter: Secondary | ICD-10-CM | POA: Insufficient documentation

## 2023-01-02 DIAGNOSIS — R1030 Lower abdominal pain, unspecified: Secondary | ICD-10-CM | POA: Diagnosis present

## 2023-01-02 DIAGNOSIS — X58XXXA Exposure to other specified factors, initial encounter: Secondary | ICD-10-CM | POA: Insufficient documentation

## 2023-01-02 DIAGNOSIS — R109 Unspecified abdominal pain: Secondary | ICD-10-CM

## 2023-01-02 LAB — COMPREHENSIVE METABOLIC PANEL
ALT: 17 U/L (ref 0–44)
AST: 16 U/L (ref 15–41)
Albumin: 4 g/dL (ref 3.5–5.0)
Alkaline Phosphatase: 51 U/L (ref 38–126)
Anion gap: 7 (ref 5–15)
BUN: 7 mg/dL (ref 6–20)
CO2: 26 mmol/L (ref 22–32)
Calcium: 8.8 mg/dL — ABNORMAL LOW (ref 8.9–10.3)
Chloride: 105 mmol/L (ref 98–111)
Creatinine, Ser: 0.81 mg/dL (ref 0.44–1.00)
GFR, Estimated: >60 mL/min (ref >60)
Glucose, Bld: 98 mg/dL (ref 70–99)
Potassium: 3.4 mmol/L — ABNORMAL LOW (ref 3.5–5.1)
Sodium: 138 mmol/L (ref 135–145)
Total Bilirubin: 0.6 mg/dL (ref 0.3–1.2)
Total Protein: 6.7 g/dL (ref 6.5–8.1)

## 2023-01-02 LAB — CBC
HCT: 37.6 % (ref 36.0–46.0)
Hemoglobin: 13.1 g/dL (ref 12.0–15.0)
MCH: 30.4 pg (ref 26.0–34.0)
MCHC: 34.8 g/dL (ref 30.0–36.0)
MCV: 87.2 fL (ref 80.0–100.0)
Platelets: 381 10*3/uL (ref 150–400)
RBC: 4.31 MIL/uL (ref 3.87–5.11)
RDW: 12.4 % (ref 11.5–15.5)
WBC: 8 10*3/uL (ref 4.0–10.5)
nRBC: 0 % (ref 0.0–0.2)

## 2023-01-02 LAB — HCG, QUANTITATIVE, PREGNANCY: hCG, Beta Chain, Quant, S: 1 m[IU]/mL (ref <5)

## 2023-01-02 LAB — LIPASE, BLOOD: Lipase: 30 U/L (ref 11–51)

## 2023-01-02 MED ORDER — HYDROCODONE-ACETAMINOPHEN 5-325 MG PO TABS
1.0000 | ORAL_TABLET | Freq: Once | ORAL | Status: AC
  Administered 2023-01-02: 1 via ORAL
  Filled 2023-01-02: qty 1

## 2023-01-02 MED ORDER — MORPHINE SULFATE (PF) 4 MG/ML IV SOLN
4.0000 mg | Freq: Once | INTRAVENOUS | Status: AC
Start: 2023-01-02 — End: 2023-01-02
  Administered 2023-01-02: 4 mg via INTRAVENOUS
  Filled 2023-01-02: qty 1

## 2023-01-02 MED ORDER — ONDANSETRON HCL 4 MG/2ML IJ SOLN
4.0000 mg | Freq: Once | INTRAMUSCULAR | Status: AC
  Administered 2023-01-02: 4 mg via INTRAVENOUS
  Filled 2023-01-02: qty 2

## 2023-01-02 MED ORDER — HYDROCODONE-ACETAMINOPHEN 5-325 MG PO TABS: 1.0000 | ORAL_TABLET | ORAL | 0 refills | Status: DC | PRN

## 2023-01-02 MED ORDER — HYDROCODONE-ACETAMINOPHEN 5-325 MG PO TABS: 1.0000 | ORAL_TABLET | Freq: Four times a day (QID) | ORAL | 0 refills | Status: DC | PRN

## 2023-01-02 MED ORDER — IOHEXOL 300 MG/ML  SOLN
100.0000 mL | Freq: Once | INTRAMUSCULAR | Status: AC | PRN
  Administered 2023-01-02: 100 mL via INTRAVENOUS

## 2023-01-02 MED ORDER — HYDROMORPHONE HCL 1 MG/ML IJ SOLN
1.0000 mg | Freq: Once | INTRAMUSCULAR | Status: AC
  Administered 2023-01-02: 1 mg via INTRAVENOUS
  Filled 2023-01-02: qty 1

## 2023-01-02 MED ORDER — SODIUM CHLORIDE 0.9 % IV BOLUS
500.0000 mL | Freq: Once | INTRAVENOUS | Status: AC
  Administered 2023-01-02: 500 mL via INTRAVENOUS

## 2023-01-02 MED ORDER — ONDANSETRON 4 MG PO TBDP: 4.0000 mg | ORAL_TABLET | Freq: Three times a day (TID) | ORAL | 0 refills | Status: DC | PRN

## 2023-01-02 NOTE — ED Triage Notes (Signed)
Pt c/o lower abd pain that started last night after having a colonoscopy yesterday.

## 2023-01-02 NOTE — Discharge Instructions (Signed)
Take medication as needed for pain.  Zofran ODT for nausea Return emergency department if worsening Use MiraLAX if constipated

## 2023-01-02 NOTE — ED Notes (Signed)
Iv started  meds given.  Md at bedside.

## 2023-01-02 NOTE — ED Provider Notes (Signed)
Shared visit  Colonoscopy yesterday for screening.  Severe abdominal pain.  Denies any nausea or vomiting.  No history of a small bowel obstruction.  Diffuse abdominal tenderness to palpation with no rebound or guarding.  Concern for possible perforation.  Immediately called back for CT scan.  Given multiple doses of IV pain medication  Lab work with no significant anemia, no significant electrolyte abnormality.  CT scan with no acute findings.  On reevaluation significant improvement of her pain.  Possibly ileus/constipation.  Given strict return precautions.  Discussed symptomatic treatment   Corena Herter, MD 01/03/23 0000

## 2023-01-02 NOTE — Telephone Encounter (Signed)
I received a message this morning from [place] RN from the endoscopy unit: This patient requested you call her back for symptoms of cramping [phone]. 8:03 AM  You were added by [name], MD. Per Dr. Tobi Bastos: 8:03 AM KA [name], MD she called endo and wants me to call her back - you can let her know I will do that after my procedures and call her before noon - I believe she has some cramping which should resolve after colonoscopy but if it gets worse or she is unwell has to go to urgent care or ER in the interim.  Called patient and the first thing she did was correct me by telling me to call her Dr. Lance Coon. I had no idea I was calling a physician. So I apologized. I then asked her to tell me a little bit more about her symptoms. She stated that yesterday after her procedure she was informed that she would probably had some abdominal cramping and minor bleeding. However, she stated that yesterday night she started to have abdominal pain and cramping, nausea, abdominal distention that has caused for her to be in a fetal position in bed. She then stated that she took a hot bath and it did not help her much, she then applied a heating pad and took Tylenol and none of that has helped. She stated that this morning she received a call from the endo unit and she let them know what she had been experiencing and that they only told her to take Tylenol and/or go to the emergency room or urgent care. She stated that under no means she will suffer waiting for hours to be seen since she had her procedure done at the endoscopy unit and the doctor has to send her something for the pain. I tod her that Dr. Tobi Bastos doesn't prescribe pain medications and she stated that he is responsible for her care and he is to send her something for the pain as she stated that she refuses to go anywhere. Patient also stated that she is also expecting a call from the physician as soon as possible to attend to her  symptoms. I let her know that Dr. Tobi Bastos was currently scoping and that he would try to call her after he is done with scoping patients and she stated that he would have to make time in between patients to give her a call and send her something for her pain. I told her that I would need to send him the message and he would try to call her when he had a chance. She stated that she is expecting a call soon.

## 2023-01-02 NOTE — Telephone Encounter (Signed)
I called the patient back around 12:40 PM.  Spoke to her.  She said that she was fine after the procedure after going home had lower abdominal cramping no significant abdominal distention was initially passing gas but not doing so later.  Denies any nausea vomiting no fevers.  I explained to her that it is unusual to have symptoms 1 day after her procedure and would want to give her the benefit of doubt and have her evaluated at the ER to make sure there are no complications related to the procedure and she may require imaging after evaluation.  I explained to her that she is free to contact us if she required any further information otherwise usually I would get the ER visit chart sent to me.   Dr [name] MD,MRCP [facility]) Gastroenterology/Hepatology Pager: [phone]

## 2023-01-02 NOTE — Telephone Encounter (Signed)
Resent rx

## 2023-01-02 NOTE — ED Provider Notes (Signed)
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  Patient states started having cramps last night after the colonoscopy.  Patient states that she is not having a lot of abdominal pain.  Tylenol has not helped.  States now his abdomen appears to be bloated.  States colonoscopy was normal.  Did call his office to talk with Dr. Allena Katz who told her to come to the emergency department.      Physical Exam   Triage Vital Signs: ED Triage Vitals  Encounter Vitals Group     BP 01/02/23 1334 126/85     Systolic BP Percentile --      Diastolic BP Percentile --      Pulse Rate 01/02/23 1334 78     Resp 01/02/23 1334 18     Temp 01/02/23 1334 98.6 F (37 C)     Temp Source 01/02/23 1334 Oral     SpO2 01/02/23 1334 98 %     Weight 01/02/23 1338 165 lb (74.8 kg)     Height 01/02/23 1338 5\' 5"  (1.651 m)     Head Circumference --      Peak Flow --      Pain Score 01/02/23 1338 8     Pain Loc --      Pain Education --      Exclude from Growth Chart --     Most recent vital signs: Vitals:   01/02/23 1334  BP: 126/85  Pulse: 78  Resp: 18  Temp: 98.6 F (37 C)  SpO2: 98%     General: Awake, no distress.   CV:  Good peripheral perfusion. regular rate and  rhythm Resp:  Normal effort.  Abd:  Abdomen distended, bruise noted around the umbilicus, abdomen extremely tender to palpation Other:      ED Results / Procedures / Treatments   Labs (all labs ordered are listed, but only abnormal results are displayed) Labs Reviewed  COMPREHENSIVE METABOLIC PANEL - Abnormal; Notable for the following components:      Result Value   Potassium 3.4 (*)    Calcium 8.8 (*)    All other components within  normal limits  LIPASE, BLOOD  CBC  HCG, QUANTITATIVE, PREGNANCY  URINALYSIS, ROUTINE W REFLEX MICROSCOPIC  POC URINE PREG, ED     EKG     RADIOLOGY CT abdomen pelvis IV contrast    PROCEDURES:   Procedures   MEDICATIONS ORDERED IN ED: Medications  HYDROcodone-acetaminophen (NORCO/VICODIN) 5-325 MG per tablet 1 tablet (has no administration in time range)  morphine (PF) 4 MG/ML injection 4 mg (4 mg Intravenous Given 01/02/23 1648)  ondansetron (ZOFRAN) injection 4 mg (4 mg Intravenous Given 01/02/23 1648)  sodium chloride 0.9 % bolus 500 mL (500 mLs Intravenous New Bag/Given 01/02/23 1649)  iohexol (OMNIPAQUE) 300 MG/ML solution 100 mL (100 mLs Intravenous Contrast Given 01/02/23 1657)  HYDROmorphone (DILAUDID) injection 1 mg (1 mg Intravenous Given 01/02/23 1726)     IMPRESSION / MDM / ASSESSMENT AND PLAN / ED COURSE  I reviewed the triage vital signs and the nursing notes.  Differential diagnosis includes, but is not limited to, perforation, peritonitis, postop pain  Patient's presentation is most consistent with acute presentation with potential threat to life or bodily function.   Labs reassuring, pregnancy test from yesterday was negative  CT abdomen pelvis IV contrast to rule out perforation  Medications given: Morphine 4 mg IV, Zofran 4 mg IV and normal saline 1 L   Patient did not have any pain relief with morphine, added Dilaudid 1 mg IV  CT abdomen pelvis with IV contrast independently reviewed interpreted by me as being negative for any acute abnormality.  Recheck of the patient she did have relief with the Dilaudid.  I did explain all of the CT findings to the patient.  Strict instructions to return emergency department worsening.  She given a prescription for hydrocodone/acetaminophen.  She is to follow-up with GI if not improving in 2 to 3 days.  If she is feeling constipated she should add a little MiraLAX.  Stay on  clear liquids today, slowly introduce brat diet tomorrow.  Patient is in agreement treatment plan.  She is discharged stable condition in the care of her husband   FINAL CLINICAL IMPRESSION(S) / ED DIAGNOSES   Final diagnoses:  Acute abdominal pain     Rx / DC Orders   ED Discharge Orders          Ordered    HYDROcodone-acetaminophen (NORCO/VICODIN) 5-325 MG tablet  Every 6 hours PRN        01/02/23 1856    ondansetron (ZOFRAN-ODT) 4 MG disintegrating tablet  Every 8 hours PRN        01/02/23 1856             Note:  This document was prepared using Dragon voice recognition software and may include unintentional dictation errors.    Faythe Ghee, PA-C 01/02/23 1911    Corena Herter, MD 01/03/23 [phone]

## 2023-01-12 ENCOUNTER — Other Ambulatory Visit: Payer: Self-pay

## 2023-01-12 ENCOUNTER — Observation Stay
Admission: EM | Admit: 2023-01-12 | Discharge: 2023-01-16 | Disposition: A | Discharge status: Home / Self Care | Payer: BC Managed Care – PPO | Attending: Internal Medicine | Admitting: Internal Medicine

## 2023-01-12 DIAGNOSIS — R1084 Generalized abdominal pain: Principal | ICD-10-CM

## 2023-01-12 DIAGNOSIS — R109 Unspecified abdominal pain: Secondary | ICD-10-CM | POA: Diagnosis present

## 2023-01-12 DIAGNOSIS — E1169 Type 2 diabetes mellitus with other specified complication: Secondary | ICD-10-CM

## 2023-01-12 DIAGNOSIS — Z1152 Encounter for screening for COVID-19: Secondary | ICD-10-CM | POA: Insufficient documentation

## 2023-01-12 DIAGNOSIS — E119 Type 2 diabetes mellitus without complications: Secondary | ICD-10-CM | POA: Insufficient documentation

## 2023-01-12 DIAGNOSIS — N179 Acute kidney failure, unspecified: Secondary | ICD-10-CM | POA: Insufficient documentation

## 2023-01-12 DIAGNOSIS — Z7984 Long term (current) use of oral hypoglycemic drugs: Secondary | ICD-10-CM | POA: Insufficient documentation

## 2023-01-12 DIAGNOSIS — Z9889 Other specified postprocedural states: Secondary | ICD-10-CM

## 2023-01-12 DIAGNOSIS — Z79899 Other long term (current) drug therapy: Secondary | ICD-10-CM | POA: Diagnosis not present

## 2023-01-12 DIAGNOSIS — R52 Pain, unspecified: Secondary | ICD-10-CM | POA: Diagnosis present

## 2023-01-12 DIAGNOSIS — I10 Essential (primary) hypertension: Secondary | ICD-10-CM | POA: Insufficient documentation

## 2023-01-12 DIAGNOSIS — F411 Generalized anxiety disorder: Secondary | ICD-10-CM | POA: Diagnosis present

## 2023-01-12 DIAGNOSIS — Z794 Long term (current) use of insulin: Secondary | ICD-10-CM

## 2023-01-12 DIAGNOSIS — A419 Sepsis, unspecified organism: Secondary | ICD-10-CM

## 2023-01-12 DIAGNOSIS — I959 Hypotension, unspecified: Secondary | ICD-10-CM | POA: Insufficient documentation

## 2023-01-12 DIAGNOSIS — G43909 Migraine, unspecified, not intractable, without status migrainosus: Secondary | ICD-10-CM | POA: Diagnosis present

## 2023-01-12 DIAGNOSIS — R519 Headache, unspecified: Secondary | ICD-10-CM | POA: Diagnosis present

## 2023-01-12 DIAGNOSIS — M26629 Arthralgia of temporomandibular joint, unspecified side: Secondary | ICD-10-CM | POA: Diagnosis present

## 2023-01-12 DIAGNOSIS — K5902 Outlet dysfunction constipation: Secondary | ICD-10-CM | POA: Diagnosis present

## 2023-01-12 LAB — APTT: aPTT: 27 s (ref 24–36)

## 2023-01-12 LAB — CBC WITH DIFFERENTIAL/PLATELET
Abs Immature Granulocytes: 0.02 10*3/uL (ref 0.00–0.07)
Basophils Absolute: 0.1 10*3/uL (ref 0.0–0.1)
Basophils Relative: 1 %
Eosinophils Absolute: 0.1 10*3/uL (ref 0.0–0.5)
Eosinophils Relative: 1 %
HCT: 37.3 % (ref 36.0–46.0)
Hemoglobin: 12.6 g/dL (ref 12.0–15.0)
Immature Granulocytes: 0 %
Lymphocytes Relative: 31 %
Lymphs Abs: 2.9 10*3/uL (ref 0.7–4.0)
MCH: 30.9 pg (ref 26.0–34.0)
MCHC: 33.8 g/dL (ref 30.0–36.0)
MCV: 91.4 fL (ref 80.0–100.0)
Monocytes Absolute: 0.9 10*3/uL (ref 0.1–1.0)
Monocytes Relative: 10 %
Neutro Abs: 5.3 10*3/uL (ref 1.7–7.7)
Neutrophils Relative %: 57 %
Platelets: 336 10*3/uL (ref 150–400)
RBC: 4.08 MIL/uL (ref 3.87–5.11)
RDW: 12.9 % (ref 11.5–15.5)
WBC: 9.3 10*3/uL (ref 4.0–10.5)
nRBC: 0 % (ref 0.0–0.2)

## 2023-01-12 LAB — COMPREHENSIVE METABOLIC PANEL
ALT: 13 U/L (ref 0–44)
AST: 17 U/L (ref 15–41)
Albumin: 4 g/dL (ref 3.5–5.0)
Alkaline Phosphatase: 50 U/L (ref 38–126)
Anion gap: 10 (ref 5–15)
BUN: 15 mg/dL (ref 6–20)
CO2: 23 mmol/L (ref 22–32)
Calcium: 8.6 mg/dL — ABNORMAL LOW (ref 8.9–10.3)
Chloride: 106 mmol/L (ref 98–111)
Creatinine, Ser: 1.84 mg/dL — ABNORMAL HIGH (ref 0.44–1.00)
GFR, Estimated: 33 mL/min — ABNORMAL LOW (ref >60)
Glucose, Bld: 81 mg/dL (ref 70–99)
Potassium: 3.7 mmol/L (ref 3.5–5.1)
Sodium: 139 mmol/L (ref 135–145)
Total Bilirubin: 0.6 mg/dL (ref <1.2)
Total Protein: 6.8 g/dL (ref 6.5–8.1)

## 2023-01-12 LAB — PROTIME-INR
INR: 1.1 (ref 0.8–1.2)
Prothrombin Time: 14.5 s (ref 11.4–15.2)

## 2023-01-12 LAB — LACTIC ACID, PLASMA: Lactic Acid, Venous: 3.7 mmol/L (ref 0.5–1.9)

## 2023-01-12 MED ORDER — METRONIDAZOLE 500 MG/100ML IV SOLN
500.0000 mg | Freq: Once | INTRAVENOUS | Status: AC
Start: 2023-01-12 — End: 2023-01-12
  Administered 2023-01-12: 500 mg via INTRAVENOUS
  Filled 2023-01-12: qty 100

## 2023-01-12 MED ORDER — HYDROMORPHONE HCL 1 MG/ML IJ SOLN
1.0000 mg | Freq: Once | INTRAMUSCULAR | Status: AC
  Administered 2023-01-12: 1 mg via INTRAVENOUS
  Filled 2023-01-12: qty 1

## 2023-01-12 MED ORDER — LACTATED RINGERS IV BOLUS (SEPSIS)
1000.0000 mL | Freq: Once | INTRAVENOUS | Status: AC
  Administered 2023-01-12: 1000 mL via INTRAVENOUS

## 2023-01-12 MED ORDER — LACTATED RINGERS IV BOLUS (SEPSIS)
1000.0000 mL | Freq: Once | INTRAVENOUS | Status: AC
  Administered 2023-01-13: 1000 mL via INTRAVENOUS

## 2023-01-12 MED ORDER — LACTATED RINGERS IV SOLN: INTRAVENOUS | Status: DC

## 2023-01-12 MED ORDER — SODIUM CHLORIDE 0.9 % IV SOLN
2.0000 g | Freq: Once | INTRAVENOUS | Status: AC
  Administered 2023-01-12: 2 g via INTRAVENOUS
  Filled 2023-01-12: qty 12.5

## 2023-01-12 MED ORDER — LACTATED RINGERS IV BOLUS (SEPSIS)
500.0000 mL | Freq: Once | INTRAVENOUS | Status: AC
  Administered 2023-01-13: 500 mL via INTRAVENOUS

## 2023-01-12 MED ORDER — ONDANSETRON HCL 4 MG/2ML IJ SOLN
4.0000 mg | Freq: Once | INTRAMUSCULAR | Status: AC
  Administered 2023-01-12: 4 mg via INTRAVENOUS
  Filled 2023-01-12: qty 2

## 2023-01-12 NOTE — Progress Notes (Incomplete)
CODE SEPSIS - PHARMACY COMMUNICATION  **Broad Spectrum Antibiotics should be administered within 1 hour of Sepsis diagnosis**  Time Code Sepsis Called/Page Received: 11/10 @ 2209  Antibiotics Ordered: Cefepime, metronidazole  Time of 1st antibiotic administration: ***  Additional action taken by pharmacy: ***  If necessary, Name of Provider/Nurse Contacted: ***    Merryl Hacker ,PharmD Clinical Pharmacist  01/12/2023  10:10 PM

## 2023-01-12 NOTE — Sepsis Progress Note (Signed)
Elink following code sepsis °

## 2023-01-12 NOTE — ED Triage Notes (Signed)
Pt arrives via [facility] from home Severe abd pain and cramping 10/10 Per ems rebound tenderness all through upper abdominal and wraps around back Feels like a rushing sensation with touch/palpation recent endoscopy, d/c with Pain mgmt and told to f/u in ER if pain worstens Positive orthostatic low BPs, best BP was on her head 110/38 No LOC but multiple syncopal     Vitals: 88/46 HR: 108-110 128 CBG SPo2 98% RA MedHx:  EMS administered LR fluids #20 RAC

## 2023-01-12 NOTE — ED Notes (Signed)
Rainbow and type and screen blood tubes sent down with "save tube labels.

## 2023-01-12 NOTE — Progress Notes (Signed)
PHARMACY -  BRIEF ANTIBIOTIC NOTE   Pharmacy has received consult(s) for cefepime from an ED provider.  The patient's profile has been reviewed for ht/wt/allergies/indication/available labs.    One time order(s) placed for cefepime 2 g IV x 1  Further antibiotics/pharmacy consults should be ordered by admitting physician if indicated.                       Thank you, Merryl Hacker, PharmD Clinical Pharmacist 01/12/2023  10:09 PM

## 2023-01-12 NOTE — ED Provider Notes (Signed)
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  EMS states patient's with systolics in the 70s and Routes with IV fluids going at this time.  Patient states that this pain has been worsening since her endoscopy and is not responding to home pain control. ROS: Patient currently denies any vision changes, tinnitus, difficulty speaking, facial droop, sore throat, chest pain, shortness of breath, nausea/vomiting/diarrhea, dysuria, or weakness/numbness/paresthesias in any extremity   Physical Exam  Triage Vital Signs: ED Triage Vitals  Encounter Vitals Group     BP 01/12/23 2151 (!) 81/52     Systolic BP Percentile --      Diastolic BP Percentile --      Pulse Rate 01/12/23 2151 98     Resp 01/12/23 2151 16     Temp 01/12/23 2151 98.3 F (36.8 C)     Temp Source 01/12/23 2151 Oral     SpO2 01/12/23 2151 98 %     Weight 01/12/23 2155 165 lb (74.8 kg)     Height 01/12/23 2155 5' (1.524 m)     Head Circumference --      Peak Flow --      Pain Score 01/12/23 2155 10     Pain Loc --      Pain Education --      Exclude from Growth Chart --    Most recent vital signs: Vitals:   01/12/23 2200 01/12/23 2210  BP: 92/70 92/60  Pulse:  98  Resp: 16 10  Temp:    SpO2:  98%   General: Awake, oriented x4. CV:  Good peripheral perfusion.  Resp:  Normal effort.  Abd:  No distention.  Rigid with tenderness to palpation generally Other:  Middle-aged overweight African-American female resting comfortably in no acute distress ED Results / Procedures / Treatments  Labs (all labs ordered are listed, but only abnormal  results are displayed) Labs Reviewed  COMPREHENSIVE METABOLIC PANEL - Abnormal; Notable for the following components:      Result Value   Creatinine, Ser 1.84 (*)    Calcium 8.6 (*)    GFR, Estimated 33 (*)    All other components within normal limits  RESP PANEL BY RT-PCR (RSV, FLU A&B, COVID)  RVPGX2  CULTURE, BLOOD (ROUTINE X 2)  CULTURE, BLOOD (ROUTINE X 2)  CBC WITH DIFFERENTIAL/PLATELET  PROTIME-INR  APTT  LACTIC ACID, PLASMA  LACTIC ACID, PLASMA  POC URINE PREG, ED   EKG ED ECG REPORT I, Merwyn Katos, the attending physician, personally viewed and interpreted this ECG. Date: 01/12/2023 EKG Time: 2155 Rate: 97 Rhythm: normal sinus rhythm QRS Axis: normal Intervals: normal ST/T Wave abnormalities: normal Narrative Interpretation: no evidence of acute ischemia RADIOLOGY ED MD interpretation: Pending -Agree with radiology assessment Official radiology report(s): No results found. PROCEDURES: Critical Care performed: No .1-3 Lead EKG Interpretation  Performed by: Merwyn Katos, MD Authorized by: Merwyn Katos, MD     Interpretation: normal     ECG rate:  91   ECG rate assessment: normal     Rhythm: sinus rhythm     Ectopy: none     Conduction: normal  MEDICATIONS ORDERED IN ED: Medications  lactated ringers infusion (has no administration in time range)  lactated ringers bolus 1,000 mL (1,000 mLs Intravenous New Bag/Given 01/12/23 2218)    And  lactated ringers bolus 1,000 mL (has no administration in time range)    And  lactated ringers bolus 500 mL (has no administration in time range)  metroNIDAZOLE (FLAGYL) IVPB 500 mg (500 mg Intravenous New Bag/Given 01/12/23 2233)  ceFEPIme (MAXIPIME) 2 g in sodium chloride 0.9 % 100 mL IVPB (2 g Intravenous New Bag/Given 01/12/23 2227)  ondansetron (ZOFRAN) injection 4 mg (4 mg Intravenous Given 01/12/23 2251)  HYDROmorphone (DILAUDID) injection 1 mg (1 mg Intravenous Given 01/12/23 2252)   IMPRESSION /  MDM / ASSESSMENT AND PLAN / ED COURSE  I reviewed the triage vital signs and the nursing notes.                             The patient is on the cardiac monitor to evaluate for evidence of arrhythmia and/or significant heart rate changes. Patient's presentation is most consistent with acute presentation with potential threat to life or bodily function. The Pt presents with abdominal pain and hypotension highly concerning for sepsis (suspected intra-abdominal source). At this time, the Pt is satting well on room air, hypotensive, and appears hemodynamically unstable.  Will start empiric antibiotics and fluids.  Due to hypotension, will administer fluids gradually with frequent reassessment. Have low suspicion for a GI, skin/soft tissue, or CNS source at this time, but will reconsider if initial workup is unremarkable.  - CBC, BMP, LFTs - VBG - UA - BCx x2, Lactate - EKG - CXR - CT abdomen/pelvis with p.o. and IV contrast - Empiric Abx: Cefepime/Flagyl - Fluids: 30cc/kg LR  Care of this patient will be signed out to the oncoming physician at the end of my shift.  All pertinent patient information conveyed and all questions answered.  All further care and disposition decisions will be made by the oncoming physician.   FINAL CLINICAL IMPRESSION(S) / ED DIAGNOSES   Final diagnoses:  Generalized abdominal pain  Sepsis, due to unspecified organism, unspecified whether acute organ dysfunction present [facility])   Rx / DC Orders   ED Discharge Orders     None      Note:  This document was prepared using Dragon voice recognition software and may include unintentional dictation errors.   Merwyn Katos, MD 01/12/23 [phone]

## 2023-01-13 ENCOUNTER — Emergency Department: Payer: BC Managed Care – PPO

## 2023-01-13 DIAGNOSIS — R1084 Generalized abdominal pain: Secondary | ICD-10-CM | POA: Diagnosis not present

## 2023-01-13 DIAGNOSIS — R52 Pain, unspecified: Secondary | ICD-10-CM | POA: Diagnosis present

## 2023-01-13 DIAGNOSIS — R109 Unspecified abdominal pain: Secondary | ICD-10-CM

## 2023-01-13 DIAGNOSIS — R197 Diarrhea, unspecified: Secondary | ICD-10-CM | POA: Diagnosis not present

## 2023-01-13 DIAGNOSIS — N179 Acute kidney failure, unspecified: Secondary | ICD-10-CM | POA: Diagnosis not present

## 2023-01-13 DIAGNOSIS — I959 Hypotension, unspecified: Secondary | ICD-10-CM

## 2023-01-13 DIAGNOSIS — Z9889 Other specified postprocedural states: Secondary | ICD-10-CM

## 2023-01-13 LAB — CBC
HCT: 36.8 % (ref 36.0–46.0)
Hemoglobin: 12.5 g/dL (ref 12.0–15.0)
MCH: 31.3 pg (ref 26.0–34.0)
MCHC: 34 g/dL (ref 30.0–36.0)
MCV: 92 fL (ref 80.0–100.0)
Platelets: 325 10*3/uL (ref 150–400)
RBC: 4 MIL/uL (ref 3.87–5.11)
RDW: 13 % (ref 11.5–15.5)
WBC: 9.1 10*3/uL (ref 4.0–10.5)
nRBC: 0 % (ref 0.0–0.2)

## 2023-01-13 LAB — COMPREHENSIVE METABOLIC PANEL
ALT: 13 U/L (ref 0–44)
AST: 15 U/L (ref 15–41)
Albumin: 3.7 g/dL (ref 3.5–5.0)
Alkaline Phosphatase: 46 U/L (ref 38–126)
Anion gap: 7 (ref 5–15)
BUN: 12 mg/dL (ref 6–20)
CO2: 26 mmol/L (ref 22–32)
Calcium: 8.1 mg/dL — ABNORMAL LOW (ref 8.9–10.3)
Chloride: 104 mmol/L (ref 98–111)
Creatinine, Ser: 1.29 mg/dL — ABNORMAL HIGH (ref 0.44–1.00)
GFR, Estimated: 51 mL/min — ABNORMAL LOW (ref >60)
Glucose, Bld: 96 mg/dL (ref 70–99)
Potassium: 3.7 mmol/L (ref 3.5–5.1)
Sodium: 137 mmol/L (ref 135–145)
Total Bilirubin: 0.6 mg/dL (ref <1.2)
Total Protein: 6.3 g/dL — ABNORMAL LOW (ref 6.5–8.1)

## 2023-01-13 LAB — PREGNANCY, URINE: Preg Test, Ur: NEGATIVE

## 2023-01-13 LAB — RESP PANEL BY RT-PCR (RSV, FLU A&B, COVID)  RVPGX2
Influenza A by PCR: NEGATIVE
Influenza B by PCR: NEGATIVE
Resp Syncytial Virus by PCR: NEGATIVE
SARS Coronavirus 2 by RT PCR: NEGATIVE

## 2023-01-13 LAB — URINALYSIS, COMPLETE (UACMP) WITH MICROSCOPIC
Bacteria, UA: NONE SEEN
Bilirubin Urine: NEGATIVE
Glucose, UA: 50 mg/dL — AB
Hgb urine dipstick: NEGATIVE
Ketones, ur: NEGATIVE mg/dL
Leukocytes,Ua: NEGATIVE
Nitrite: NEGATIVE
Protein, ur: NEGATIVE mg/dL
RBC / HPF: 0 RBC/hpf (ref 0–5)
Specific Gravity, Urine: 1.006 (ref 1.005–1.030)
pH: 7 (ref 5.0–8.0)

## 2023-01-13 LAB — SAMPLE TO BLOOD BANK

## 2023-01-13 LAB — HEMOGLOBIN A1C
Hgb A1c MFr Bld: 5.6 % (ref 4.8–5.6)
Mean Plasma Glucose: 114.02 mg/dL

## 2023-01-13 LAB — HIV ANTIBODY (ROUTINE TESTING W REFLEX): HIV Screen 4th Generation wRfx: NONREACTIVE

## 2023-01-13 LAB — PROCALCITONIN: Procalcitonin: <0.1 ng/mL

## 2023-01-13 LAB — LACTIC ACID, PLASMA: Lactic Acid, Venous: 1.8 mmol/L (ref 0.5–1.9)

## 2023-01-13 MED ORDER — ENOXAPARIN SODIUM 40 MG/0.4ML IJ SOSY
0.5000 mg/kg | PREFILLED_SYRINGE | INTRAMUSCULAR | Status: DC
  Administered 2023-01-13 – 2023-01-16 (×4): 40 mg via SUBCUTANEOUS
  Filled 2023-01-13 (×4): qty 0.4

## 2023-01-13 MED ORDER — MORPHINE SULFATE (PF) 2 MG/ML IV SOLN: 1.0000 mg | Freq: Four times a day (QID) | INTRAVENOUS | Status: DC | PRN

## 2023-01-13 MED ORDER — HYDROCODONE-ACETAMINOPHEN 5-325 MG PO TABS
1.0000 | ORAL_TABLET | ORAL | Status: DC | PRN
  Administered 2023-01-13 – 2023-01-14 (×5): 2 via ORAL
  Administered 2023-01-14 (×2): 1 via ORAL
  Administered 2023-01-14: 2 via ORAL
  Administered 2023-01-15: 1 via ORAL
  Administered 2023-01-15: 2 via ORAL
  Administered 2023-01-15 – 2023-01-16 (×2): 1 via ORAL
  Filled 2023-01-13: qty 1
  Filled 2023-01-13 (×4): qty 2
  Filled 2023-01-13 (×2): qty 1
  Filled 2023-01-13: qty 2
  Filled 2023-01-13 (×2): qty 1
  Filled 2023-01-13 (×2): qty 2

## 2023-01-13 MED ORDER — SUMATRIPTAN SUCCINATE 50 MG PO TABS
50.0000 mg | ORAL_TABLET | ORAL | Status: DC | PRN
  Filled 2023-01-13: qty 1

## 2023-01-13 MED ORDER — LOSARTAN POTASSIUM 50 MG PO TABS
50.0000 mg | ORAL_TABLET | Freq: Every day | ORAL | Status: DC
Start: 2023-01-13 — End: 2023-01-13
  Administered 2023-01-13: 50 mg via ORAL
  Filled 2023-01-13: qty 1

## 2023-01-13 MED ORDER — ACETAMINOPHEN 650 MG RE SUPP: 650.0000 mg | Freq: Four times a day (QID) | RECTAL | Status: DC | PRN

## 2023-01-13 MED ORDER — PROCHLORPERAZINE EDISYLATE 10 MG/2ML IJ SOLN
5.0000 mg | Freq: Once | INTRAMUSCULAR | Status: AC
  Administered 2023-01-13: 5 mg via INTRAVENOUS
  Filled 2023-01-13: qty 1

## 2023-01-13 MED ORDER — MORPHINE SULFATE (PF) 2 MG/ML IV SOLN
2.0000 mg | INTRAVENOUS | Status: DC | PRN
  Administered 2023-01-13: 2 mg via INTRAVENOUS
  Filled 2023-01-13: qty 1

## 2023-01-13 MED ORDER — LACTATED RINGERS IV SOLN
150.0000 mL/h | INTRAVENOUS | Status: DC
Start: 2023-01-13 — End: 2023-01-13
  Administered 2023-01-13 (×3): 150 mL/h via INTRAVENOUS

## 2023-01-13 MED ORDER — HYDROCODONE-ACETAMINOPHEN 5-325 MG PO TABS
1.0000 | ORAL_TABLET | ORAL | Status: DC | PRN
  Administered 2023-01-13: 2 via ORAL
  Filled 2023-01-13: qty 2

## 2023-01-13 MED ORDER — PROPRANOLOL HCL 20 MG PO TABS
20.0000 mg | ORAL_TABLET | Freq: Two times a day (BID) | ORAL | Status: DC
  Administered 2023-01-13 – 2023-01-16 (×7): 20 mg via ORAL
  Filled 2023-01-13 (×8): qty 1

## 2023-01-13 MED ORDER — KETOROLAC TROMETHAMINE 30 MG/ML IJ SOLN: 30.0000 mg | Freq: Four times a day (QID) | INTRAMUSCULAR | Status: DC | PRN

## 2023-01-13 MED ORDER — TIZANIDINE HCL 4 MG PO TABS
4.0000 mg | ORAL_TABLET | Freq: Three times a day (TID) | ORAL | Status: DC | PRN
  Administered 2023-01-14 (×2): 4 mg via ORAL
  Filled 2023-01-13 (×2): qty 1

## 2023-01-13 MED ORDER — HYDROMORPHONE HCL 1 MG/ML IJ SOLN
1.0000 mg | Freq: Once | INTRAMUSCULAR | Status: AC
  Administered 2023-01-13: 1 mg via INTRAVENOUS
  Filled 2023-01-13: qty 1

## 2023-01-13 MED ORDER — ZOLPIDEM TARTRATE 5 MG PO TABS: 5.0000 mg | ORAL_TABLET | Freq: Every evening | ORAL | Status: DC | PRN

## 2023-01-13 MED ORDER — LOSARTAN POTASSIUM 50 MG PO TABS: 50.0000 mg | ORAL_TABLET | Freq: Every day | ORAL | Status: DC

## 2023-01-13 MED ORDER — LACTATED RINGERS IV SOLN: INTRAVENOUS | Status: AC

## 2023-01-13 MED ORDER — ACETAMINOPHEN 325 MG PO TABS: 650.0000 mg | ORAL_TABLET | Freq: Four times a day (QID) | ORAL | Status: DC | PRN

## 2023-01-13 MED ORDER — ALPRAZOLAM 0.5 MG PO TABS
0.5000 mg | ORAL_TABLET | Freq: Every day | ORAL | Status: DC | PRN
  Administered 2023-01-13 – 2023-01-15 (×2): 0.5 mg via ORAL
  Filled 2023-01-13 (×4): qty 1

## 2023-01-13 MED ORDER — METRONIDAZOLE 500 MG/100ML IV SOLN
500.0000 mg | Freq: Two times a day (BID) | INTRAVENOUS | Status: DC
  Filled 2023-01-13: qty 100

## 2023-01-13 MED ORDER — SODIUM CHLORIDE 0.9 % IV SOLN
2.0000 g | Freq: Two times a day (BID) | INTRAVENOUS | Status: DC
  Filled 2023-01-13: qty 12.5

## 2023-01-13 MED ORDER — METHOCARBAMOL 1000 MG/10ML IJ SOLN
1000.0000 mg | Freq: Once | INTRAMUSCULAR | Status: AC
  Administered 2023-01-13: 1000 mg via INTRAVENOUS
  Filled 2023-01-13: qty 10

## 2023-01-13 MED ORDER — ZOLPIDEM TARTRATE 5 MG PO TABS
7.5000 mg | ORAL_TABLET | Freq: Every evening | ORAL | Status: DC | PRN
  Administered 2023-01-13 – 2023-01-15 (×3): 7.5 mg via ORAL
  Filled 2023-01-13 (×3): qty 2

## 2023-01-13 MED ORDER — IOHEXOL 300 MG/ML  SOLN
80.0000 mL | Freq: Once | INTRAMUSCULAR | Status: AC | PRN
  Administered 2023-01-13: 80 mL via INTRAVENOUS

## 2023-01-13 MED ORDER — BUTALBITAL-APAP-CAFFEINE 50-325-40 MG PO TABS
2.0000 | ORAL_TABLET | Freq: Once | ORAL | Status: AC
  Administered 2023-01-13: 2 via ORAL
  Filled 2023-01-13: qty 2

## 2023-01-13 MED ORDER — GABAPENTIN 300 MG PO CAPS
300.0000 mg | ORAL_CAPSULE | Freq: Two times a day (BID) | ORAL | Status: DC
  Administered 2023-01-13 – 2023-01-16 (×7): 300 mg via ORAL
  Filled 2023-01-13 (×8): qty 1

## 2023-01-13 MED ORDER — SODIUM CHLORIDE 0.9 % IV SOLN
12.5000 mg | Freq: Four times a day (QID) | INTRAVENOUS | Status: DC | PRN
  Administered 2023-01-13 – 2023-01-15 (×3): 12.5 mg via INTRAVENOUS
  Filled 2023-01-13 (×3): qty 12.5
  Filled 2023-01-13: qty 0.5

## 2023-01-13 NOTE — Assessment & Plan Note (Addendum)
No acute concerns 
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
## 2023-01-13 NOTE — Assessment & Plan Note (Signed)
On alprazolam as needed

## 2023-01-13 NOTE — Consult Note (Signed)
[facility] SURGICAL CONSULTATION NOTE (initial) - cpt: 78295   HISTORY OF PRESENT ILLNESS (HPI):  47 y.o. female presented to [facility] ED yesterday for evaluation of abdominal pain. Patient underwent screening colonoscopy on 10/30 with Dr Tobi Bastos which was unremarkable. Unfortunately, she developed lower, crampy, abdominal pain 24 hours after the procedure. She did present to the ED at that time and underwent CT Abdomen/Pelvis which was reassuring. She was ultimately discharged home. Her abdominal pain continued and yesterday she presented again to the ED via EMS. She was found to have systolic BP in the 70s. She does report associated nausea and diarrhea as well. No fever, chills, cough, SOB, CP. Only previous intra-abdominal surgery is laparoscopic ovarian cystectomy. Work up in the ED revealed a normal WBC to 9.3K, Hgb to 12.6, AKI with sCr - 1.86 (1.29), no electrolyte derangements, venous lactate to 3.7 (now 1.8). She again underwent CT Chest/Abdomen/Pelvis which was without intra-abdominal findings. She was admitted to the medicine service. She was started on Cefepime & Flagyl empirically.   Surgery is consulted by hospitalist physician Dr. Lindajo Royal, MD in this context for evaluation and management of intractable abdominal pain after colonoscopy.  PAST MEDICAL HISTORY (PMH):  Past Medical History:  Diagnosis Date   Acute recurrent maxillary sinusitis 03/01/2015   AD (atopic dermatitis) 06/23/2013   Anxiety    Arthralgia of temporomandibular joint 12/19/2013   Chicken pox    Migraine headache    Ovarian cyst 10-2014   right     PAST SURGICAL HISTORY [facility]):  Past Surgical History:  Procedure Laterality Date   COLONOSCOPY WITH PROPOFOL N/A 01/01/2023   Procedure: COLONOSCOPY WITH PROPOFOL;  Surgeon: [name], MD;  Location: [facility] ENDOSCOPY;  Service: Gastroenterology;  Laterality: N/A;  Would prefer arrival time at 10 am.  Has to drop her daughter off to school.    LAPAROSCOPIC OVARIAN CYSTECTOMY Right 12/18/2017   Procedure: Diagnostic LAPAROSCOPY/Excision of OVARIAN Mass;  Surgeon: Maxie Better, MD;  Location: [facility] ORS;  Service: Gynecology;  Laterality: Right;   TONSILLECTOMY  1980     MEDICATIONS:  Prior to Admission medications   Medication Sig Start Date End Date Taking? Authorizing Provider  acetaminophen (TYLENOL) 325 MG tablet Take 650 mg by mouth every 6 (six) hours as needed.   Yes [provider]  ALPRAZolam Prudy Feeler) 0.5 MG tablet Take 1 tablet (0.5 mg total) by mouth daily as needed for anxiety. 12/24/21  Yes Dugal, Wyatt Mage, FNP  ascorbic acid (VITAMIN C) 500 MG tablet Take 1,000 mg by mouth daily.   Yes [provider]  CALCIUM-MAGNESIUM PO Take 15 mLs by mouth daily.   Yes [provider]  gabapentin (NEURONTIN) 300 MG capsule Take two tablets qhs 08/13/21  Yes Dugal, Tabitha, FNP  Galcanezumab-gnlm (EMGALITY) 120 MG/ML SOSY INJECT 120 MG UNDER THE SKIN EVERY 28 DAYS 03/07/21  Yes [provider]  losartan (COZAAR) 50 MG tablet Take 50 mg by mouth daily.   Yes [provider]  metFORMIN (GLUCOPHAGE) 500 MG tablet TAKE 1 TABLET (500 MG TOTAL) BY MOUTH DAILY. 11/27/21 01/13/23 Yes Dugal, Wyatt Mage, FNP  Multiple Vitamins-Minerals (MULTIVITAL PO) Take 30 mLs by mouth daily.   Yes [provider]  ondansetron (ZOFRAN-ODT) 4 MG disintegrating tablet Take 1 tablet (4 mg total) by mouth every 8 (eight) hours as needed. 01/02/23  Yes Fisher, Roselyn Bering, PA-C  propranolol (INDERAL) 20 MG tablet Take 20-40 mg by mouth 2 (two) times daily. Monitor blood pressure and take as needed  20-40 mg up to twice a day. 01/06/23  Yes [provider]  Semaglutide,0.25 or 0.5MG /DOS, 2 MG/3ML SOPN Inject 0.5 mg [place] weekly 11/27/21  Yes Dugal, Tabitha, FNP  tiZANidine (ZANAFLEX) 4 MG tablet Take 1 tablet (4 mg total) by mouth every 8 (eight) hours as needed for muscle spasms. 07/25/22 07/25/23 Yes Delton Prairie, MD   Ubrogepant 100 MG TABS Take 100 mg by mouth as directed. Take 1 tablet (100 mg total) by mouth daily as needed. Take 1 tablet by mouth at ONSET of migraine or aura. May repeat a second dose in 2 HOURS if needed. DO NOT TAKE MORE than 2 doses a day, 2 days a week, or 8 days a month. 06/22/21  Yes [provider]  VITAMIN D PO Take 1 drop by mouth daily.   Yes [provider]  cloNIDine (CATAPRES) 0.1 MG tablet Take one po qd prn high blood pressure >140/90 Patient not taking: Reported on 01/13/2023 08/13/21   Mort Sawyers, FNP  HYDROcodone-acetaminophen (NORCO) 5-325 MG tablet Take 1 tablet by mouth every 4 (four) hours as needed for moderate pain (pain score 4-6). Patient not taking: Reported on 01/13/2023 01/02/23   Merwyn Katos, MD  lidocaine (LIDODERM) 5 % Place 1 patch onto the skin every 12 (twelve) hours. Remove & Discard patch within 12 hours or as directed by MD Patient not taking: Reported on 01/13/2023 07/25/22 07/25/23  Delton Prairie, MD  losartan (COZAAR) 25 MG tablet Take 1/2 tablet once daily Patient not taking: Reported on 01/13/2023 08/14/21   Mort Sawyers, FNP  norethindrone (AYGESTIN) 5 MG tablet Take 1 tablet (5 mg total) by mouth daily. 08/13/21   Mort Sawyers, FNP     ALLERGIES:  Allergies  Allergen Reactions   Penicillins Anaphylaxis and Itching    Facial swelling Has patient had a PCN reaction causing immediate rash, facial/tongue/throat swelling, SOB or lightheadedness with hypotension: Yes Has patient had a PCN reaction causing severe rash involving mucus membranes or skin necrosis: No Has patient had a PCN reaction that required hospitalization: No Has patient had a PCN reaction occurring within the last 10 years: No If all of the above answers are "NO", then may proceed with Cephalosporin use.    Botox [Onabotulinumtoxina] Other (See Comments)    Burning, skin sloughing, blood clots   Percocet [Oxycodone-Acetaminophen] Nausea And Vomiting    Naproxen Swelling    Swelling in fingers up to arm.   Ibuprofen Rash   Tramadol Rash    Headaches      SOCIAL HISTORY:  Social History   Socioeconomic History   Marital status: Married    Spouse name: Not on file   Number of children: 1   Years of education: Not on file   Highest education level: Not on file  Occupational History    Comment: [employer]  Tobacco Use   Smoking status: Never   Smokeless tobacco: Never  Vaping Use   Vaping status: Never Used  Substance and Sexual Activity   Alcohol use: Yes    Alcohol/week: 1.0 standard drink of alcohol    Types: 1 Glasses of wine per week    Comment: occassionally   Drug use: No   Sexual activity: Yes    Partners: Male    Birth control/protection: Pill, OCP  Other Topics Concern   Not on file  Social History Narrative   Daughter age 61 tillaya   Social Determinants of Health   Financial Resource Strain: Not  on file  Food Insecurity: Not on file  Transportation Needs: Not on file  Physical Activity: Not on file  Stress: Not on file  Social Connections: Not on file  Intimate Partner Violence: Not on file     FAMILY HISTORY:  Family History  Problem Relation Age of Onset   Hyperlipidemia Mother    Heart disease Father        died at age 74 had pacemaker and defib   Diabetes Father    COPD Father    Arthritis Maternal Aunt       REVIEW OF SYSTEMS:  Review of Systems  Constitutional:  Negative for chills and fever.  Respiratory:  Negative for cough and shortness of breath.   Cardiovascular:  Negative for chest pain and palpitations.  Gastrointestinal:  Positive for abdominal pain, diarrhea and nausea. Negative for blood in stool, constipation and vomiting.  Genitourinary:  Negative for dysuria and urgency.  All other systems reviewed and are negative.   VITAL SIGNS:  Temp:  [98.3 F (36.8 C)-98.5 F (36.9 C)] 98.5 F (36.9 C) (11/11 0505) Pulse Rate:  [83-98] 83 (11/11 0505) Resp:   [10-18] 18 (11/11 0505) BP: (81-151)/(52-106) 151/106 (11/11 0505) SpO2:  [98 %-100 %] 99 % (11/11 0505) Weight:  [74.8 kg-79.9 kg] 79.9 kg (11/10 2159)     Height: 5' (152.4 cm) Weight: 79.9 kg BMI (Calculated): 34.41   INTAKE/OUTPUT:  11/10 0701 - 11/11 0700 In: 2100 [IV Piggyback:2100] Out: -   PHYSICAL EXAM:  Physical Exam Vitals and nursing note reviewed. Exam conducted with a chaperone present.  Constitutional:      General: She is not in acute distress.    Appearance: She is well-developed and normal weight. She is not ill-appearing.     Comments: Resting in bed; NAD. Husband at bedside   HENT:     Head: Normocephalic and atraumatic.  Eyes:     General: No scleral icterus.    Extraocular Movements: Extraocular movements intact.  Cardiovascular:     Rate and Rhythm: Normal rate and regular rhythm.     Heart sounds: Normal heart sounds.  Pulmonary:     Effort: Pulmonary effort is normal. No respiratory distress.  Abdominal:     General: Abdomen is flat. There is no distension.     Palpations: Abdomen is soft.     Tenderness: There is abdominal tenderness in the right lower quadrant, periumbilical area and suprapubic area. There is no guarding or rebound.     Comments: Abdomen is soft, she is primarily tender in RLQ, suprapubic, and umbilicus, non-distended, no rebound/guarding. She is not overtly peritonitic  Genitourinary:    Comments: Deferred Skin:    General: Skin is warm and dry.     Coloration: Skin is not jaundiced.     Findings: No erythema.  Neurological:     General: No focal deficit present.     Mental Status: She is alert and oriented to person, place, and time.  Psychiatric:        Mood and Affect: Mood normal.        Behavior: Behavior normal.      Labs:     Latest Ref Rng & Units 01/13/2023    3:28 AM 01/12/2023   10:00 PM 01/02/2023    1:39 PM  CBC  WBC 4.0 - 10.5 K/uL 9.1  9.3  8.0   Hemoglobin 12.0 - 15.0 g/dL 65.7  84.6  96.2    Hematocrit 36.0 - 46.0 % 36.8  37.3  37.6   Platelets 150 - 400 K/uL 325  336  381       Latest Ref Rng & Units 01/13/2023    3:28 AM 01/12/2023   10:00 PM 01/02/2023    1:39 PM  CMP  Glucose 70 - 99 mg/dL 96  81  98   BUN 6 - 20 mg/dL 12  15  7    Creatinine 0.44 - 1.00 mg/dL 8.46  9.62  9.52   Sodium 135 - 145 mmol/L 137  139  138   Potassium 3.5 - 5.1 mmol/L 3.7  3.7  3.4   Chloride 98 - 111 mmol/L 104  106  105   CO2 22 - 32 mmol/L 26  23  26    Calcium 8.9 - 10.3 mg/dL 8.1  8.6  8.8   Total Protein 6.5 - 8.1 g/dL 6.3  6.8  6.7   Total Bilirubin <1.2 mg/dL 0.6  0.6  0.6   Alkaline Phos 38 - 126 U/L 46  50  51   AST 15 - 41 U/L 15  17  16    ALT 0 - 44 U/L 13  13  17      Imaging studies:   CT Chest/Abdomen/Pelvis (01/13/2023) personally reviewed without any acute intra-abdominal findings, no free air, no abscess, no pneumatosis, and radiologist report reviewed below: IMPRESSION: 1. No acute abnormality in the chest, abdomen, or pelvis. 2. Unchanged mild central intrahepatic biliary dilation with focal dilation in the left hepatic lobe. Recommend correlation with LFTs and nonemergent MRI/MRCP.   Assessment/Plan:  47 y.o. female with intractable abdominal pain after colonoscopy without clear etiology   - Fortunately, her abdominal work up and imaging on both 10/31 and last night area reassuring without any evidence of bowel injury, infection, nor ischemic changes. Her laboratory work up is reassuring and lactate elevation on presentation is likely secondary to AKI and dehydration, which has resolved after IVF resuscitation. At this time, there are no indications for surgical intervention.   - May consider gastroenteritis as well, consider stool studies   - Okay for diet from surgical perspective as long as no other planned interventions from other services - Reasonable to continue empiric Abx; She does not appear overtly septic, no obvious infectious source.   - Continue  resuscitation; renal function improved; acidosis resolved   - Monitor abdominal examination  - Mobilize as tolerated  - Further management per primary service.    - Nothing to offer from general surgery standpoint at this time. We will remain available should condition or needs change.   All of the above findings and recommendations were discussed with the patient and her family (husband at bedside), and all of their questions were answered to their expressed satisfaction.  Thank you for the opportunity to participate in this patient's care.   -- Lynden Oxford, PA-C [facility] 01/13/2023, 7:21 AM M-F: 7am - 4pm

## 2023-01-13 NOTE — Assessment & Plan Note (Addendum)
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

## 2023-01-13 NOTE — ED Provider Notes (Signed)
Patient received in signout from Dr. Vicente Males pending CT imaging to assess for perforation after recent endoscopy.  Patient presented with severe pain, abdominal guarding, hypotension and lactic acidosis.  CT chest, abdomen and pelvis returns without acute features or signs of perforation.  She was transiently hypotensive and this improved with fluid resuscitation.  Her blood work shows signs of an AKI but normal CBC.  Lactic acidosis cleared with fluids.  Due to her ongoing pain control needs and this AKI, I consult with medicine for admission.  .Critical Care  Performed by: Delton Prairie, MD Authorized by: Delton Prairie, MD   Critical care provider statement:    Critical care time (minutes):  30   Critical care time was exclusive of:  Separately billable procedures and treating other patients   Critical care was necessary to treat or prevent imminent or life-threatening deterioration of the following conditions:  Circulatory failure   Critical care was time spent personally by me on the following activities:  Development of treatment plan with patient or surrogate, discussions with consultants, evaluation of patient's response to treatment, examination of patient, ordering and review of laboratory studies, ordering and review of radiographic studies, ordering and performing treatments and interventions, pulse oximetry, re-evaluation of patient's condition and review of old charts     Delton Prairie, MD 01/13/23 0130

## 2023-01-13 NOTE — Assessment & Plan Note (Signed)
Sliding scale insulin coverage Hold metformin and semaglutide

## 2023-01-13 NOTE — Consult Note (Signed)
[name] , MD [address], [place], [place], [address] [address], [place], [place], [address] Phone: [phone]  Fax: [phone]  Consultation  Referring Provider:   Dr Aleen Campi  Primary Care Physician:  Wilmon Pali, MD Primary Gastroenterologist:  Dr. 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  24 hours after the procedure developed abdominal discomfort and I advised her to go to the emergency room.  CT scan of the abdomen did not show any perforation but showed central intrahepatic biliary ductal dilation.  Prior history of laparoscopic ovarian cystectomy.  She believes during the procedure at [facility] there was scar tissue found in her abdomen as well as had some form of a bowel injury.  She came into the ER with further abdominal pain diarrhea was found to have AKI.  CT scan of the chest abdomen pelvis showed no acute intra-abdominal findings started on cefepime and Flagyl.  She says she has been having diarrhea for the past few days 1-2 bowel movements a day very watery denies any NSAID use denies any fever.  The pain is in the lower part of the abdomen localized no clear aggravating or relieving factors.  She also says that the pain is similar to what she had when she had the ovarian cystectomy but more intense.   Stool test for C. difficile and GI PCR have been ordered.  Past Medical History:  Diagnosis Date   Acute recurrent maxillary sinusitis 03/01/2015   AD (atopic dermatitis) 06/23/2013   Anxiety    Arthralgia of temporomandibular joint 12/19/2013   Chicken pox    Migraine headache    Ovarian cyst 10-2014   right    Past Surgical History:  Procedure Laterality Date   COLONOSCOPY WITH PROPOFOL N/A 01/01/2023    Procedure: COLONOSCOPY WITH PROPOFOL;  Surgeon: [name], MD;  Location: [facility] ENDOSCOPY;  Service: Gastroenterology;  Laterality: N/A;  Would prefer arrival time at 10 am.  Has to drop her daughter off to school.   LAPAROSCOPIC OVARIAN CYSTECTOMY Right 12/18/2017   Procedure: Diagnostic LAPAROSCOPY/Excision of OVARIAN Mass;  Surgeon: Maxie Better, MD;  Location: [facility] ORS;  Service: Gynecology;  Laterality: Right;   TONSILLECTOMY  1980    Prior to Admission medications   Medication Sig Start Date End Date Taking? Authorizing Provider  acetaminophen (TYLENOL) 325 MG tablet Take 650 mg by mouth every 6 (six) hours as needed.   Yes [provider]  ALPRAZolam Prudy Feeler) 0.5 MG tablet Take 1 tablet (0.5 mg total) by mouth daily as needed for anxiety. 12/24/21  Yes Dugal, Wyatt Mage, FNP  ascorbic acid (VITAMIN C) 500 MG tablet Take 1,000 mg by mouth daily.   Yes [provider]  CALCIUM-MAGNESIUM PO Take 15 mLs by mouth daily.   Yes [provider]  gabapentin (NEURONTIN) 300 MG capsule Take two tablets qhs 08/13/21  Yes Dugal, Tabitha, FNP  Galcanezumab-gnlm (EMGALITY) 120 MG/ML SOSY INJECT 120 MG UNDER THE SKIN EVERY 28 DAYS 03/07/21  Yes [provider]  losartan (COZAAR) 50 MG tablet Take 50 mg by mouth daily.   Yes [provider]  metFORMIN (GLUCOPHAGE) 500 MG tablet TAKE 1 TABLET (500 MG TOTAL) BY MOUTH DAILY. 11/27/21  01/13/23 Yes Dugal, Wyatt Mage, FNP  Multiple Vitamins-Minerals (MULTIVITAL PO) Take 30 mLs by mouth daily.   Yes [provider]  ondansetron (ZOFRAN-ODT) 4 MG disintegrating tablet Take 1 tablet (4 mg total) by mouth every 8 (eight) hours as needed. 01/02/23  Yes Fisher, Roselyn Bering, PA-C  propranolol (INDERAL) 20 MG tablet Take 20-40 mg by mouth 2 (two) times daily. Monitor blood pressure and take as needed 20-40 mg up to twice a day. 01/06/23  Yes [provider]  Semaglutide,0.25 or 0.5MG /DOS, 2 MG/3ML SOPN Inject 0.5 mg [place]  weekly 11/27/21  Yes Dugal, Tabitha, FNP  tiZANidine (ZANAFLEX) 4 MG tablet Take 1 tablet (4 mg total) by mouth every 8 (eight) hours as needed for muscle spasms. 07/25/22 07/25/23 Yes Delton Prairie, MD  Ubrogepant 100 MG TABS Take 100 mg by mouth as directed. Take 1 tablet (100 mg total) by mouth daily as needed. Take 1 tablet by mouth at ONSET of migraine or aura. May repeat a second dose in 2 HOURS if needed. DO NOT TAKE MORE than 2 doses a day, 2 days a week, or 8 days a month. 06/22/21  Yes [provider]  VITAMIN D PO Take 1 drop by mouth daily.   Yes [provider]  norethindrone (AYGESTIN) 5 MG tablet Take 1 tablet (5 mg total) by mouth daily. 08/13/21   Mort Sawyers, FNP    Family History  Problem Relation Age of Onset   Hyperlipidemia Mother    Heart disease Father        died at age 2 had pacemaker and defib   Diabetes Father    COPD Father    Arthritis Maternal Aunt      Social History   Tobacco Use   Smoking status: Never   Smokeless tobacco: Never  Vaping Use   Vaping status: Never Used  Substance Use Topics   Alcohol use: Yes    Alcohol/week: 1.0 standard drink of alcohol    Types: 1 Glasses of wine per week    Comment: occassionally   Drug use: No    Allergies as of 01/12/2023 - Review Complete 01/12/2023  Allergen Reaction Noted   Penicillins Anaphylaxis and Itching    Percocet [oxycodone-acetaminophen] Nausea And Vomiting 12/18/2017   Naproxen Swelling 05/29/2015   Ibuprofen Rash 05/29/2015   Tramadol Rash 01/11/2016    Review of Systems:    All systems reviewed and negative except where noted in HPI.   Physical Exam:  Vital signs in last 24 hours: Temp:  [98 F (36.7 C)-98.5 F (36.9 C)] 98 F (36.7 C) (11/11 0740) Pulse Rate:  [83-98] 84 (11/11 0740) Resp:  [10-18] 17 (11/11 0740) BP: (81-151)/(52-106) 147/92 (11/11 0740) SpO2:  [98 %-100 %] 99 % (11/11 0740) Weight:  [74.8 kg-79.9 kg] 79.9 kg (11/10 2159)   General:    Pleasant, cooperative in NAD tongue and lips appear very dry Head:  Normocephalic and atraumatic. Eyes:   No icterus.   Conjunctiva pink. PERRLA. Ears:  Normal auditory acuity. Neck:  Supple; no masses or thyroidomegaly Lungs: Respirations even and unlabored. Lungs clear to auscultation bilaterally.   No wheezes, crackles, or rhonchi.  Heart:  Regular rate and rhythm;  Without murmur, clicks, rubs or gallops Abdomen:  Soft, nondistended, nontender. Normal bowel sounds. No appreciable masses or hepatomegaly.  No rebound or guarding.  Neurologic:  Alert and oriented x3;  grossly normal neurologically. Skin:  Intact without significant lesions or rashes. Cervical Nodes:  No significant cervical  adenopathy. Psych:  Alert and cooperative. Normal affect.  LAB RESULTS: Recent Labs    01/12/23 2200 01/13/23 0328  WBC 9.3 9.1  HGB 12.6 12.5  HCT 37.3 36.8  PLT 336 325   BMET Recent Labs    01/12/23 2200 01/13/23 0328  NA 139 137  K 3.7 3.7  CL 106 104  CO2 23 26  GLUCOSE 81 96  BUN 15 12  CREATININE 1.84* 1.29*  CALCIUM 8.6* 8.1*   LFT Recent Labs    01/13/23 0328  PROT 6.3*  ALBUMIN 3.7  AST 15  ALT 13  ALKPHOS 46  BILITOT 0.6   PT/INR Recent Labs    01/12/23 2200  LABPROT 14.5  INR 1.1    STUDIES: CT CHEST ABDOMEN PELVIS W CONTRAST  Result Date: 01/13/2023 CLINICAL DATA:  Acute nonlocalized abdominal pain. Recent endoscopy. Rigid abdomen on exam. Rebound tenderness follow-through upper abdomen wrapping around to back. Low blood pressures. EXAM: CT CHEST, ABDOMEN, AND PELVIS WITH CONTRAST TECHNIQUE: Multidetector CT imaging of the chest, abdomen and pelvis was performed following the standard protocol during bolus administration of intravenous contrast. RADIATION DOSE REDUCTION: This exam was performed according to the departmental dose-optimization program which includes automated exposure control, adjustment of the mA and/or kV according to patient size and/or  use of iterative reconstruction technique. CONTRAST:  80mL OMNIPAQUE IOHEXOL 300 MG/ML  SOLN COMPARISON:  CT abdomen and pelvis 01/02/2023 and chest radiograph 05/20/2021 FINDINGS: CT CHEST FINDINGS Cardiovascular: Normal heart size. No pericardial effusion. Normal caliber thoracic aorta. Mediastinum/Nodes: Esophagus is unremarkable. Patent trachea. No mediastinal fluid or gas. No adenopathy. Lungs/Pleura: No focal consolidation, pleural effusion, or pneumothorax. Musculoskeletal: No acute rib fracture. CT ABDOMEN PELVIS FINDINGS Hepatobiliary: Unchanged mild central intrahepatic biliary dilation with focal dilation in the left hepatic lobe on series 2/image 45. Hepatic cyst in the right hepatic lobe are unchanged. Pancreas: Unremarkable. Spleen: Unremarkable. Adrenals/Urinary Tract: Normal adrenal glands the kidneys. No urinary calculi or hydronephrosis. Unremarkable bladder. Stomach/Bowel: Moderate stool in the rectum. No bowel obstruction or bowel wall thickening. Stomach and appendix are within normal limits. Vascular/Lymphatic: No significant vascular findings are present. No enlarged abdominal or pelvic lymph nodes. Reproductive: Uterus and bilateral adnexa are unremarkable. Other: No free intraperitoneal fluid or air. Musculoskeletal: No acute fracture. IMPRESSION: 1. No acute abnormality in the chest, abdomen, or pelvis. 2. Unchanged mild central intrahepatic biliary dilation with focal dilation in the left hepatic lobe. Recommend correlation with LFTs and nonemergent MRI/MRCP. Electronically Signed   By: Minerva Fester M.D.   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  She had initially presented the day after the procedure to the ER and no abnormalities were seen in the CAT scan except central hepatic dilation.  On repeat CT scan as well no other  abnormalities have been found.  Liver function tests are completely normal seen by general surgery.  With a history of diarrhea abdominal discomfort differentials would include infectious gastroenteritis.It could be change in bowel flora of the colon vulnerable to infections  Plan 1.  Agree with GI PCR and stool testing to rule out infectious gastroenteritis with diarrhea and AKI.  Clinically she still appears dehydrated 2.  Recommend MRCP when renal functions are significantly better to evaluate the central hepatic dilation better that was seen on the CT scan 3.  Urine analysis to rule out a UTI in view of AKI and abdominal discomfort 4.  I will follow the patient daily   Thank you for involving me in the care of this patient.      LOS: 0 days   [name], MD  01/13/2023, 10:25 AM

## 2023-01-13 NOTE — Progress Notes (Signed)
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  Pharmacy has been consulted for Cefepime dosing.  Plan: Cefepime 2 gm IV X 1 given in ED on 11/10 @ 2227. Cefepime 2 gm IV Q12H ordered to start on 11/11 @ 1000.   Height: 5' (152.4 cm) Weight: 79.9 kg (176 lb 3.2 oz) IBW/kg (Calculated) : 45.5  Temp (24hrs), Avg:98.3 F (36.8 C), Min:98.3 F (36.8 C), Max:98.3 F (36.8 C)  Recent Labs  Lab 01/12/23 2200 01/12/23 2250 01/13/23 0006  WBC 9.3  --   --   CREATININE 1.84*  --   --   LATICACIDVEN  --  3.7* 1.8    Estimated Creatinine Clearance: 34.6 mL/min (A) (by C-G formula based on SCr of 1.84 mg/dL (H)).    Allergies  Allergen Reactions   Penicillins Anaphylaxis and Itching    Facial swelling Has patient had a PCN reaction causing immediate rash, facial/tongue/throat swelling, SOB or lightheadedness with hypotension: Yes Has patient had a PCN reaction causing severe rash involving mucus membranes or skin necrosis: No Has patient had a PCN reaction that required hospitalization: No Has patient had a PCN reaction occurring within the last 10 years: No If all of the above answers are "NO", then may proceed with Cephalosporin use.    Botox [Onabotulinumtoxina] Other (See Comments)    Burning, skin sloughing, blood clots   Percocet [Oxycodone-Acetaminophen] Nausea And Vomiting   Naproxen Swelling    Swelling in fingers up to arm.   Ibuprofen Rash   Tramadol Rash    Headaches     Antimicrobials this admission:   >>   >>   Dose adjustments this admission:   Microbiology results:  BCx:   UCx:    Sputum:    MRSA PCR:   Thank you for allowing pharmacy to be a part of this patient's care.  [name] 01/13/2023 2:36 AM

## 2023-01-13 NOTE — Assessment & Plan Note (Addendum)
Primary hypertension BP 81/52 on arrival, suspect dehydration vs SIRS/sepsis, was fluid responsive Hold home antihypertensives Received IV fluid bolus in the ED Continue to monitor

## 2023-01-13 NOTE — Assessment & Plan Note (Addendum)
Creatinine 1.84 above baseline of 0.81 Etiology possibly related to ATN from dehydration.  BP was 81/50 on arrival Oral and IV hydration Monitor renal function and avoid nephrotoxins

## 2023-01-13 NOTE — Plan of Care (Signed)

## 2023-01-13 NOTE — Progress Notes (Signed)
CODE SEPSIS - PHARMACY COMMUNICATION  **Broad Spectrum Antibiotics should be administered within 1 hour of Sepsis diagnosis**  Time Code Sepsis Called/Page Received: 11/10 @ 2207   Antibiotics Ordered: Cefepime   Time of 1st antibiotic administration: Cefepime 2 gm IV X 1 on 11/10 @ 2227   Additional action taken by pharmacy:   If necessary, Name of Provider/Nurse Contacted:     Jory Tanguma D ,PharmD Clinical Pharmacist  01/13/2023  12:43 AM

## 2023-01-13 NOTE — Assessment & Plan Note (Addendum)
Chronic daily headaches Following admission, patient complained of migraine Takes Ubrelvy and [address].  Has received Botox but had an skin reaction Unable to tolerate sumatriptan Patient requested Toradol and Phenergan  Will hold off on NSAID until headache improves Phenergan ordered

## 2023-01-14 ENCOUNTER — Observation Stay: Payer: BC Managed Care – PPO

## 2023-01-14 DIAGNOSIS — K838 Other specified diseases of biliary tract: Secondary | ICD-10-CM

## 2023-01-14 DIAGNOSIS — N179 Acute kidney failure, unspecified: Secondary | ICD-10-CM | POA: Diagnosis not present

## 2023-01-14 DIAGNOSIS — R52 Pain, unspecified: Secondary | ICD-10-CM | POA: Diagnosis not present

## 2023-01-14 DIAGNOSIS — Z8719 Personal history of other diseases of the digestive system: Secondary | ICD-10-CM | POA: Diagnosis not present

## 2023-01-14 DIAGNOSIS — R109 Unspecified abdominal pain: Secondary | ICD-10-CM | POA: Diagnosis not present

## 2023-01-14 LAB — BASIC METABOLIC PANEL
Anion gap: 7 (ref 5–15)
BUN: 5 mg/dL — ABNORMAL LOW (ref 6–20)
CO2: 26 mmol/L (ref 22–32)
Calcium: 8.6 mg/dL — ABNORMAL LOW (ref 8.9–10.3)
Chloride: 106 mmol/L (ref 98–111)
Creatinine, Ser: 0.8 mg/dL (ref 0.44–1.00)
GFR, Estimated: >60 mL/min (ref >60)
Glucose, Bld: 85 mg/dL (ref 70–99)
Potassium: 3.7 mmol/L (ref 3.5–5.1)
Sodium: 139 mmol/L (ref 135–145)

## 2023-01-14 LAB — MAGNESIUM: Magnesium: 1.8 mg/dL (ref 1.7–2.4)

## 2023-01-14 MED ORDER — LACTATED RINGERS IV SOLN: INTRAVENOUS | Status: AC

## 2023-01-14 MED ORDER — GADOBUTROL 1 MMOL/ML IV SOLN
7.0000 mL | Freq: Once | INTRAVENOUS | Status: AC | PRN
  Administered 2023-01-14: 7 mL via INTRAVENOUS

## 2023-01-14 MED ORDER — PROMETHAZINE HCL 25 MG PO TABS
25.0000 mg | ORAL_TABLET | Freq: Four times a day (QID) | ORAL | Status: DC | PRN
  Administered 2023-01-14 – 2023-01-15 (×2): 25 mg via ORAL
  Filled 2023-01-14 (×3): qty 1
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
## 2023-01-14 NOTE — Progress Notes (Signed)
[name] , MD [address], [place], [place], [address] [address], [address], [place], [place], [address] Phone: [phone]  Fax: [phone]   [facility], Dr. is being followed for abdominal pain  Day  Subjective: She is feeling better.  No nausea vomiting.  Has not had a bowel movement.  Having clear liquids   Objective: Vital signs in last 24 hours: Vitals:   01/13/23 0740 01/13/23 1428 01/14/23 0000 01/14/23 0736  BP: (!) 147/92 (!) 156/111 (!) 157/106 (!) 137/98  Pulse: 84 89 80 96  Resp: 17 16 20 16   Temp: 98 F (36.7 C) 98.1 F (36.7 C) 98.3 F (36.8 C) 98.3 F (36.8 C)  TempSrc:   Oral Oral  SpO2: 99% 100% 99% 100%  Weight:      Height:       Weight change:  No intake or output data in the 24 hours ending 01/14/23 0919   Exam: General appearance appears better.  Lips appear less dry Abdomen: soft, nontender, normal bowel sounds   Lab Results: @LABTEST2 @ Micro Results: Recent Results (from the past 240 hour(s))  Blood Culture (routine x 2)     Status: None (Preliminary result)   Collection Time: 01/13/23  3:28 AM   Specimen: BLOOD  Result Value Ref Range Status   Specimen Description BLOOD RIGHT ARM  Final   Special Requests   Final    BOTTLES DRAWN AEROBIC AND ANAEROBIC Blood Culture results may not be optimal due to an inadequate volume of blood received in culture bottles   Culture   Final    NO GROWTH 1 DAY Performed at [facility], [address], [place], [place] [address]    Report Status PENDING  Incomplete  Blood Culture (routine x 2)     Status: None (Preliminary result)   Collection Time: 01/13/23  3:30 AM   Specimen: BLOOD  Result Value Ref Range Status   Specimen Description BLOOD LEFT ARM  Final   Special Requests   Final    BOTTLES DRAWN AEROBIC AND ANAEROBIC Blood Culture results may not be optimal due to an inadequate volume of blood received in culture bottles   Culture   Final    NO  GROWTH 1 DAY Performed at [facility], [address], [place], [place] [address]    Report Status PENDING  Incomplete  Resp panel by RT-PCR (RSV, Flu A&B, Covid) Anterior Nasal Swab     Status: None   Collection Time: 01/13/23  9:34 AM   Specimen: Anterior Nasal Swab  Result Value Ref Range Status   SARS Coronavirus 2 by RT PCR NEGATIVE NEGATIVE Final    Comment: (NOTE) SARS-CoV-2 target nucleic acids are NOT DETECTED.  The SARS-CoV-2 RNA is generally detectable in upper respiratory specimens during the acute phase of infection. The lowest concentration of SARS-CoV-2 viral copies this assay can detect is 138 copies/mL. A negative result does not preclude SARS-Cov-2 infection and should not be used as the sole basis for treatment or other patient management decisions. A negative result may occur with  improper specimen collection/handling, submission of specimen other than nasopharyngeal swab, presence of viral mutation(s) within the areas targeted by this assay, and inadequate number of viral copies(<138 copies/mL). A negative result must be combined with clinical observations, patient history, and epidemiological information. The expected result is Negative.  Fact Sheet for Patients:  BloggerCourse.com  Fact Sheet for Healthcare Providers:  SeriousBroker.it  This test is no t  yet approved or cleared by the [place] and  has been authorized for detection and/or diagnosis of SARS-CoV-2 by FDA under an Emergency Use Authorization (EUA). This EUA will remain  in effect (meaning this test can be used) for the duration of the COVID-19 declaration under Section 564(b)(1) of the Act, 21 U.S.C.section 360bbb-3(b)(1), unless the authorization is terminated  or revoked sooner.       Influenza A by PCR NEGATIVE NEGATIVE Final   Influenza B by PCR NEGATIVE NEGATIVE Final    Comment: (NOTE) The Xpert Xpress  SARS-CoV-2/FLU/RSV plus assay is intended as an aid in the diagnosis of influenza from Nasopharyngeal swab specimens and should not be used as a sole basis for treatment. Nasal washings and aspirates are unacceptable for Xpert Xpress SARS-CoV-2/FLU/RSV testing.  Fact Sheet for Patients: BloggerCourse.com  Fact Sheet for Healthcare Providers: SeriousBroker.it  This test is not yet approved or cleared by the [place] FDA and has been authorized for detection and/or diagnosis of SARS-CoV-2 by FDA under an Emergency Use Authorization (EUA). This EUA will remain in effect (meaning this test can be used) for the duration of the COVID-19 declaration under Section 564(b)(1) of the Act, 21 U.S.C. section 360bbb-3(b)(1), unless the authorization is terminated or revoked.     Resp Syncytial Virus by PCR NEGATIVE NEGATIVE Final    Comment: (NOTE) Fact Sheet for Patients: BloggerCourse.com  Fact Sheet for Healthcare Providers: SeriousBroker.it  This test is not yet approved or cleared by the [place] FDA and has been authorized for detection and/or diagnosis of SARS-CoV-2 by FDA under an Emergency Use Authorization (EUA). This EUA will remain in effect (meaning this test can be used) for the duration of the COVID-19 declaration under Section 564(b)(1) of the Act, 21 U.S.C. section 360bbb-3(b)(1), unless the authorization is terminated or revoked.  Performed at [facility], [address] [place], [place] [address]    Studies/Results: CT CHEST ABDOMEN PELVIS W CONTRAST  Result Date: 01/13/2023 CLINICAL DATA:  Acute nonlocalized abdominal pain. Recent endoscopy. Rigid abdomen on exam. Rebound tenderness follow-through upper abdomen wrapping around to back. Low blood pressures. EXAM: CT CHEST, ABDOMEN, AND PELVIS WITH CONTRAST TECHNIQUE: Multidetector CT imaging of the  chest, abdomen and pelvis was performed following the standard protocol during bolus administration of intravenous contrast. RADIATION DOSE REDUCTION: This exam was performed according to the departmental dose-optimization program which includes automated exposure control, adjustment of the mA and/or kV according to patient size and/or use of iterative reconstruction technique. CONTRAST:  80mL OMNIPAQUE IOHEXOL 300 MG/ML  SOLN COMPARISON:  CT abdomen and pelvis 01/02/2023 and chest radiograph 05/20/2021 FINDINGS: CT CHEST FINDINGS Cardiovascular: Normal heart size. No pericardial effusion. Normal caliber thoracic aorta. Mediastinum/Nodes: Esophagus is unremarkable. Patent trachea. No mediastinal fluid or gas. No adenopathy. Lungs/Pleura: No focal consolidation, pleural effusion, or pneumothorax. Musculoskeletal: No acute rib fracture. CT ABDOMEN PELVIS FINDINGS Hepatobiliary: Unchanged mild central intrahepatic biliary dilation with focal dilation in the left hepatic lobe on series 2/image 45. Hepatic cyst in the right hepatic lobe are unchanged. Pancreas: Unremarkable. Spleen: Unremarkable. Adrenals/Urinary Tract: Normal adrenal glands the kidneys. No urinary calculi or hydronephrosis. Unremarkable bladder. Stomach/Bowel: Moderate stool in the rectum. No bowel obstruction or bowel wall thickening. Stomach and appendix are within normal limits. Vascular/Lymphatic: No significant vascular findings are present. No enlarged abdominal or pelvic lymph nodes. Reproductive: Uterus and bilateral adnexa are unremarkable. Other: No free intraperitoneal fluid or air. Musculoskeletal: No acute fracture. IMPRESSION: 1. No acute abnormality in  the chest, abdomen, or pelvis. 2. Unchanged mild central intrahepatic biliary dilation with focal dilation in the left hepatic lobe. Recommend correlation with LFTs and nonemergent MRI/MRCP. Electronically Signed   By: Minerva Fester M.D.   On: 01/13/2023 01:04   Medications: I have  reviewed the patient's current medications. Scheduled Meds:  enoxaparin (LOVENOX) injection  0.5 mg/kg Subcutaneous Q24H   gabapentin  300 mg Oral BID   propranolol  20 mg Oral BID   Continuous Infusions:  lactated ringers Stopped (01/14/23 0911)   promethazine (PHENERGAN) injection (IM or IVPB) 12.5 mg (01/13/23 0606)   PRN Meds:.acetaminophen **OR** acetaminophen, ALPRAZolam, HYDROcodone-acetaminophen, morphine injection, promethazine (PHENERGAN) injection (IM or IVPB), tiZANidine, zolpidem   Assessment: Principal Problem:   Intractable pain Active Problems:   Headache, migraine   Spastic pelvic floor syndrome   GAD (generalized anxiety disorder)   Primary hypertension   Diabetes mellitus without complication (HCC)   AKI (acute kidney injury) (HCC)   Intractable Abdominal pain   S/P screening colonoscopy 12/22/2022   Chronic daily headache/chronic migraine   TMJ arthralgia   Hypotension  Lulla Mann-Bailey, Dr. is a 47 y.o. y/o female admitted to the hospital 11 days after her colonoscopy with abdominal pain discomfort diarrhea AKI.  She had initially presented the day after the procedure to the ER and no abnormalities were seen in the CAT scan except central hepatic dilation.  On repeat CT scan as well no other abnormalities have been found.  Liver function tests are completely normal seen by general surgery.  With a history of diarrhea abdominal discomfort differentials would include infectious possibly viral gastroenteritis.It could be change in bowel flora of the colon vulnerable to infections   Plan 1.   GI PCR and stool testing to rule out infectious gastroenteritis with diarrhea and AKI only if she has diarrhea if the stools are solid do not perform stool tests.   2.  Recommend MRCP to evaluate the central hepatic dilation better that was seen on the CT scan 3.  Advance diet as tolerated when eating and drinking well and keeping it down should go home     LOS: 0 days    Nancy Mood, MD 01/14/2023, 9:19 AM

## 2023-01-15 DIAGNOSIS — R109 Unspecified abdominal pain: Secondary | ICD-10-CM | POA: Diagnosis not present

## 2023-01-15 DIAGNOSIS — R52 Pain, unspecified: Secondary | ICD-10-CM | POA: Diagnosis not present

## 2023-01-15 DIAGNOSIS — K838 Other specified diseases of biliary tract: Secondary | ICD-10-CM | POA: Diagnosis not present

## 2023-01-15 DIAGNOSIS — Z8719 Personal history of other diseases of the digestive system: Secondary | ICD-10-CM | POA: Diagnosis not present

## 2023-01-15 LAB — GASTROINTESTINAL PANEL BY PCR, STOOL (REPLACES STOOL CULTURE)

## 2023-01-15 LAB — C DIFFICILE QUICK SCREEN W PCR REFLEX
C Diff antigen: NEGATIVE
C Diff interpretation: NOT DETECTED
C Diff toxin: NEGATIVE

## 2023-01-15 MED ORDER — DOCUSATE SODIUM 100 MG PO CAPS
200.0000 mg | ORAL_CAPSULE | Freq: Two times a day (BID) | ORAL | Status: DC
  Administered 2023-01-15 – 2023-01-16 (×2): 200 mg via ORAL
  Filled 2023-01-15 (×2): qty 2

## 2023-01-15 MED ORDER — DOCUSATE SODIUM 100 MG PO CAPS: 200.0000 mg | ORAL_CAPSULE | Freq: Two times a day (BID) | ORAL | Status: DC

## 2023-01-15 NOTE — Progress Notes (Signed)
PROGRESS NOTE    [name], Dr.  ZOX:[id number] DOB: Jul 22, 1975 DOA: 01/12/2023 PCP: Wilmon Pali, MD Assessment & Plan:   Principal Problem:   Intractable pain Active Problems:   Intractable Abdominal pain   Spastic pelvic floor syndrome   S/P screening colonoscopy 12/22/2022   AKI (acute kidney injury) (HCC)   Hypotension   Headache, migraine   Chronic daily headache/chronic migraine   GAD (generalized anxiety disorder)   Primary hypertension   Diabetes mellitus without complication (HCC)   TMJ arthralgia  Assessment and Plan:   Intractable abdominal pain: after colonoscopy 01/01/2023. Hx of myofascial abdominal wall pain and spastic pelvic floor syndrome. Etiology of pain uncertain at this time as CT chest abdomen and pelvis showed no acute abnormality.  Intrahepatic biliary dilatation: s/p MRCP which showed Mild intra and extrahepatic biliary ductal dilatation, similar to prior examination, the common bile duct measuring up to 0.8 cm, however tapering smoothly to the ampulla without calculus or other obstruction. This is of uncertain significance. ERCP may be considered to further evaluate if there is clinical or biochemical evidence of cholestasis.  In the left lobe of the liver, there is a solitary subcentimeter cyst or diverticulum involving a segmental bile duct of hepatic segment II measuring 0.7 cm, of uncertain nature or significance. In general, bile duct cysts and diverticula are thought to carry some lifetime risk of cholangiocarcinoma, and therefore hepatobiliary referral and surveillance may be warranted. Above findings discussed in detailed by GI w/ pt and pt's husband. Will need to f/u outpatient w/ GI, Dr. Tobi Bastos, in 1-2 weeks    Sepsis ruled out: did not meet criteria.   Hypotension: resolved    AKI: etiology possibly related to ATN from dehydration. Resolved w/ IVFs   Migraines: chronic & daily. Ruel Favors and [employer] at home.  Has  received Botox but had an skin reaction. Unable to tolerate sumatriptan. Norco prn   DM2: well controlled, HbA1c 5.6. Holding home metformin, semaglutide.    GAD: severity unknown. Xanax prn    DVT prophylaxis: lovenox  Code Status: full  Family Communication:  Disposition Plan: likely d/c home   Level of care: Telemetry Medical Status is: Observation The patient remains OBS appropriate and will d/c before 2 midnights.    Consultants:  GI   Procedures:   Antimicrobials:    Subjective: Pt c/o nausea   Objective: Vitals:   01/14/23 0736 01/14/23 1537 01/14/23 1609 01/14/23 2019  BP: (!) 137/98 (!) 145/103 115/75 110/76  Pulse: 96 98 87 96  Resp: 16 14 14 17   Temp: 98.3 F (36.8 C) 98.4 F (36.9 C)  98.1 F (36.7 C)  TempSrc: Oral Oral  Oral  SpO2: 100% 100% 98% 99%  Weight:      Height:        Intake/Output Summary (Last 24 hours) at 01/15/2023 0832 Last data filed at 01/15/2023 0300 Gross per 24 hour  Intake 786.62 ml  Output --  Net 786.62 ml   Filed Weights   01/12/23 2155 01/12/23 2159  Weight: 74.8 kg 79.9 kg    Examination:  General exam: Appears calm and comfortable  Respiratory system: Clear to auscultation. Respiratory effort normal. Cardiovascular system: S1 & S2+. No rubs, gallops or clicks. Gastrointestinal system: Abdomen is obese, soft and nontender. Normal bowel sounds heard. Central nervous system: Alert and oriented. Moves all extremities  Psychiatry: Judgement and insight appear normal. Mood & affect appropriate.     Data Reviewed: I have personally reviewed following labs  and imaging studies  CBC: Recent Labs  Lab 01/12/23 2200 01/13/23 0328  WBC 9.3 9.1  NEUTROABS 5.3  --   HGB 12.6 12.5  HCT 37.3 36.8  MCV 91.4 92.0  PLT 336 325   Basic Metabolic Panel: Recent Labs  Lab 01/12/23 2200 01/13/23 0328 01/14/23 0830  NA 139 137 139  K 3.7 3.7 3.7  CL 106 104 106  CO2 23 26 26   GLUCOSE 81 96 85  BUN 15 12 5*   CREATININE 1.84* 1.29* 0.80  CALCIUM 8.6* 8.1* 8.6*  MG  --   --  1.8   GFR: Estimated Creatinine Clearance: 79.6 mL/min (by C-G formula based on SCr of 0.8 mg/dL). Liver Function Tests: Recent Labs  Lab 01/12/23 2200 01/13/23 0328  AST 17 15  ALT 13 13  ALKPHOS 50 46  BILITOT 0.6 0.6  PROT 6.8 6.3*  ALBUMIN 4.0 3.7   No results for input(s): "LIPASE", "AMYLASE" in the last 168 hours. No results for input(s): "AMMONIA" in the last 168 hours. Coagulation Profile: Recent Labs  Lab 01/12/23 2200  INR 1.1   Cardiac Enzymes: No results for input(s): "CKTOTAL", "CKMB", "CKMBINDEX", "TROPONINI" in the last 168 hours. BNP (last 3 results) No results for input(s): "PROBNP" in the last 8760 hours. HbA1C: Recent Labs    01/13/23 0328  HGBA1C 5.6   CBG: No results for input(s): "GLUCAP" in the last 168 hours. Lipid Profile: No results for input(s): "CHOL", "HDL", "LDLCALC", "TRIG", "CHOLHDL", "LDLDIRECT" in the last 72 hours. Thyroid Function Tests: No results for input(s): "TSH", "T4TOTAL", "FREET4", "T3FREE", "THYROIDAB" in the last 72 hours. Anemia Panel: No results for input(s): "VITAMINB12", "FOLATE", "FERRITIN", "TIBC", "IRON", "RETICCTPCT" in the last 72 hours. Sepsis Labs: Recent Labs  Lab 01/12/23 2250 01/13/23 0006 01/13/23 0328  PROCALCITON  --   --  <0.10  LATICACIDVEN 3.7* 1.8  --     Recent Results (from the past 240 hour(s))  Blood Culture (routine x 2)     Status: None (Preliminary result)   Collection Time: 01/13/23  3:28 AM   Specimen: BLOOD  Result Value Ref Range Status   Specimen Description BLOOD RIGHT ARM  Final   Special Requests   Final    BOTTLES DRAWN AEROBIC AND ANAEROBIC Blood Culture results may not be optimal due to an inadequate volume of blood received in culture bottles   Culture   Final    NO GROWTH 2 DAYS Performed at [facility], [address], [place], [place] [address]    Report Status PENDING  Incomplete   Blood Culture (routine x 2)     Status: None (Preliminary result)   Collection Time: 01/13/23  3:30 AM   Specimen: BLOOD  Result Value Ref Range Status   Specimen Description BLOOD LEFT ARM  Final   Special Requests   Final    BOTTLES DRAWN AEROBIC AND ANAEROBIC Blood Culture results may not be optimal due to an inadequate volume of blood received in culture bottles   Culture   Final    NO GROWTH 2 DAYS Performed at [facility], [address], [place], [place] [address]    Report Status PENDING  Incomplete  Resp panel by RT-PCR (RSV, Flu A&B, Covid) Anterior Nasal Swab     Status: None   Collection Time: 01/13/23  9:34 AM   Specimen: Anterior Nasal Swab  Result Value Ref Range Status   SARS Coronavirus 2 by RT PCR NEGATIVE NEGATIVE Final  Comment: (NOTE) SARS-CoV-2 target nucleic acids are NOT DETECTED.  The SARS-CoV-2 RNA is generally detectable in upper respiratory specimens during the acute phase of infection. The lowest concentration of SARS-CoV-2 viral copies this assay can detect is 138 copies/mL. A negative result does not preclude SARS-Cov-2 infection and should not be used as the sole basis for treatment or other patient management decisions. A negative result may occur with  improper specimen collection/handling, submission of specimen other than nasopharyngeal swab, presence of viral mutation(s) within the areas targeted by this assay, and inadequate number of viral copies(<138 copies/mL). A negative result must be combined with clinical observations, patient history, and epidemiological information. The expected result is Negative.  Fact Sheet for Patients:  BloggerCourse.com  Fact Sheet for Healthcare Providers:  SeriousBroker.it  This test is no t yet approved or cleared by the [place] FDA and  has been authorized for detection and/or diagnosis of SARS-CoV-2 by FDA under an Emergency Use  Authorization (EUA). This EUA will remain  in effect (meaning this test can be used) for the duration of the COVID-19 declaration under Section 564(b)(1) of the Act, 21 U.S.C.section 360bbb-3(b)(1), unless the authorization is terminated  or revoked sooner.       Influenza A by PCR NEGATIVE NEGATIVE Final   Influenza B by PCR NEGATIVE NEGATIVE Final    Comment: (NOTE) The Xpert Xpress SARS-CoV-2/FLU/RSV plus assay is intended as an aid in the diagnosis of influenza from Nasopharyngeal swab specimens and should not be used as a sole basis for treatment. Nasal washings and aspirates are unacceptable for Xpert Xpress SARS-CoV-2/FLU/RSV testing.  Fact Sheet for Patients: BloggerCourse.com  Fact Sheet for Healthcare Providers: SeriousBroker.it  This test is not yet approved or cleared by the [place] FDA and has been authorized for detection and/or diagnosis of SARS-CoV-2 by FDA under an Emergency Use Authorization (EUA). This EUA will remain in effect (meaning this test can be used) for the duration of the COVID-19 declaration under Section 564(b)(1) of the Act, 21 U.S.C. section 360bbb-3(b)(1), unless the authorization is terminated or revoked.     Resp Syncytial Virus by PCR NEGATIVE NEGATIVE Final    Comment: (NOTE) Fact Sheet for Patients: BloggerCourse.com  Fact Sheet for Healthcare Providers: SeriousBroker.it  This test is not yet approved or cleared by the [place] FDA and has been authorized for detection and/or diagnosis of SARS-CoV-2 by FDA under an Emergency Use Authorization (EUA). This EUA will remain in effect (meaning this test can be used) for the duration of the COVID-19 declaration under Section 564(b)(1) of the Act, 21 U.S.C. section 360bbb-3(b)(1), unless the authorization is terminated or revoked.  Performed at [facility], [address], [place], [place] [address]          Radiology Studies: MR ABDOMEN MRCP W WO CONTAST  Result Date: 01/14/2023 CLINICAL DATA:  Characterize intrahepatic biliary ductal dilatation identified by prior CT EXAM: MRI ABDOMEN WITHOUT AND WITH CONTRAST (INCLUDING MRCP) TECHNIQUE: Multiplanar multisequence MR imaging of the abdomen was performed both before and after the administration of intravenous contrast. Heavily T2-weighted images of the biliary and pancreatic ducts were obtained, and three-dimensional MRCP images were rendered by post processing. CONTRAST:  7mL GADAVIST GADOBUTROL 1 MMOL/ML IV SOLN COMPARISON:  CT chest abdomen pelvis, 01/13/2023 FINDINGS: Lower chest: No acute abnormality.  Cardiomegaly. Hepatobiliary: No solid liver abnormality is seen. Small benign hepatic parenchymal cysts of the right lobe of the liver, for which no further follow-up or characterization is required (  series 4, image 14). No gallstones or gallbladder wall thickening. Mild intra and extrahepatic biliary ductal dilatation, similar to prior examination, the common bile duct measuring up to 0.8 cm, however tapering smoothly to the ampulla without calculus or other obstruction. In the left lobe of the liver, there is a subcentimeter cyst or diverticulum involving a segmental bile duct of hepatic segment II measuring 0.7 cm (series 14, image 42). Pancreas: Unremarkable. No pancreatic ductal dilatation or surrounding inflammatory changes. Spleen: Normal in size without significant abnormality. Adrenals/Urinary Tract: Adrenal glands are unremarkable. Kidneys are normal, without renal calculi, solid lesion, or hydronephrosis. Stomach/Bowel: Stomach is within normal limits. No evidence of bowel wall thickening, distention, or inflammatory changes. Vascular/Lymphatic: No significant vascular findings are present. No enlarged abdominal lymph nodes. Other: No abdominal wall hernia or abnormality. No ascites. Musculoskeletal: No  acute or significant osseous findings. IMPRESSION: 1. Mild intra and extrahepatic biliary ductal dilatation, similar to prior examination, the common bile duct measuring up to 0.8 cm, however tapering smoothly to the ampulla without calculus or other obstruction. This is of uncertain significance. ERCP may be considered to further evaluate if there is clinical or biochemical evidence of cholestasis. 2. In the left lobe of the liver, there is a solitary subcentimeter cyst or diverticulum involving a segmental bile duct of hepatic segment II measuring 0.7 cm, of uncertain nature or significance. In general, bile duct cysts and diverticula are thought to carry some lifetime risk of cholangiocarcinoma, and therefore hepatobiliary referral and surveillance may be warranted. Electronically Signed   By: Jearld Lesch M.D.   On: 01/14/2023 21:46   MR 3D Recon At Scanner  Result Date: 01/14/2023 CLINICAL DATA:  Characterize intrahepatic biliary ductal dilatation identified by prior CT EXAM: MRI ABDOMEN WITHOUT AND WITH CONTRAST (INCLUDING MRCP) TECHNIQUE: Multiplanar multisequence MR imaging of the abdomen was performed both before and after the administration of intravenous contrast. Heavily T2-weighted images of the biliary and pancreatic ducts were obtained, and three-dimensional MRCP images were rendered by post processing. CONTRAST:  7mL GADAVIST GADOBUTROL 1 MMOL/ML IV SOLN COMPARISON:  CT chest abdomen pelvis, 01/13/2023 FINDINGS: Lower chest: No acute abnormality.  Cardiomegaly. Hepatobiliary: No solid liver abnormality is seen. Small benign hepatic parenchymal cysts of the right lobe of the liver, for which no further follow-up or characterization is required (series 4, image 14). No gallstones or gallbladder wall thickening. Mild intra and extrahepatic biliary ductal dilatation, similar to prior examination, the common bile duct measuring up to 0.8 cm, however tapering smoothly to the ampulla without calculus  or other obstruction. In the left lobe of the liver, there is a subcentimeter cyst or diverticulum involving a segmental bile duct of hepatic segment II measuring 0.7 cm (series 14, image 42). Pancreas: Unremarkable. No pancreatic ductal dilatation or surrounding inflammatory changes. Spleen: Normal in size without significant abnormality. Adrenals/Urinary Tract: Adrenal glands are unremarkable. Kidneys are normal, without renal calculi, solid lesion, or hydronephrosis. Stomach/Bowel: Stomach is within normal limits. No evidence of bowel wall thickening, distention, or inflammatory changes. Vascular/Lymphatic: No significant vascular findings are present. No enlarged abdominal lymph nodes. Other: No abdominal wall hernia or abnormality. No ascites. Musculoskeletal: No acute or significant osseous findings. IMPRESSION: 1. Mild intra and extrahepatic biliary ductal dilatation, similar to prior examination, the common bile duct measuring up to 0.8 cm, however tapering smoothly to the ampulla without calculus or other obstruction. This is of uncertain significance. ERCP may be considered to further evaluate if there is clinical or biochemical evidence of  cholestasis. 2. In the left lobe of the liver, there is a solitary subcentimeter cyst or diverticulum involving a segmental bile duct of hepatic segment II measuring 0.7 cm, of uncertain nature or significance. In general, bile duct cysts and diverticula are thought to carry some lifetime risk of cholangiocarcinoma, and therefore hepatobiliary referral and surveillance may be warranted. Electronically Signed   By: Jearld Lesch M.D.   On: 01/14/2023 21:46        Scheduled Meds:  enoxaparin (LOVENOX) injection  0.5 mg/kg Subcutaneous Q24H   gabapentin  300 mg Oral BID   propranolol  20 mg Oral BID   Continuous Infusions:  promethazine (PHENERGAN) injection (IM or IVPB) 12.5 mg (01/14/23 2243)     LOS: 0 days      Charise Killian, MD [facility] Pager [phone]  If 7PM-7AM, please contact night-coverage [email or web address]  01/15/2023, 8:32 AM

## 2023-01-15 NOTE — Plan of Care (Signed)

## 2023-01-15 NOTE — Progress Notes (Signed)
[name] , MD [address], [place], [place], [address] [address], [place], [place], [address] Phone: [phone]  Fax: [phone]   [facility], Dr. is being followed for acute nausea vomiting and diarrhea day   Subjective: She is doing well today denies any diarrhea no nausea no vomiting tolerating oral intake   Objective: Vital signs in last 24 hours: Vitals:   01/14/23 1537 01/14/23 1609 01/14/23 2019 01/15/23 0838  BP: (!) 145/103 115/75 110/76 (!) 159/111  Pulse: 98 87 96 92  Resp: 14 14 17 18   Temp: 98.4 F (36.9 C)  98.1 F (36.7 C) 98.6 F (37 C)  TempSrc: Oral  Oral   SpO2: 100% 98% 99% 100%  Weight:      Height:       Weight change:   Intake/Output Summary (Last 24 hours) at 01/15/2023 1224 Last data filed at 01/15/2023 0300 Gross per 24 hour  Intake 546.62 ml  Output --  Net 546.62 ml     Exam:  Abdomen: soft, nontender, normal bowel sounds   Lab Results: @LABTEST2 @ Micro Results: Recent Results (from the past 240 hour(s))  Blood Culture (routine x 2)     Status: None (Preliminary result)   Collection Time: 01/13/23  3:28 AM   Specimen: BLOOD  Result Value Ref Range Status   Specimen Description BLOOD RIGHT ARM  Final   Special Requests   Final    BOTTLES DRAWN AEROBIC AND ANAEROBIC Blood Culture results may not be optimal due to an inadequate volume of blood received in culture bottles   Culture   Final    NO GROWTH 2 DAYS Performed at [facility], [address], [place], [place] [address]    Report Status PENDING  Incomplete  Blood Culture (routine x 2)     Status: None (Preliminary result)   Collection Time: 01/13/23  3:30 AM   Specimen: BLOOD  Result Value Ref Range Status   Specimen Description BLOOD LEFT ARM  Final   Special Requests   Final    BOTTLES DRAWN AEROBIC AND ANAEROBIC Blood Culture results may not be optimal due to an inadequate volume of blood received in culture  bottles   Culture   Final    NO GROWTH 2 DAYS Performed at [facility], [address] [place], [place] [address]    Report Status PENDING  Incomplete  Resp panel by RT-PCR (RSV, Flu A&B, Covid) Anterior Nasal Swab     Status: None   Collection Time: 01/13/23  9:34 AM   Specimen: Anterior Nasal Swab  Result Value Ref Range Status   SARS Coronavirus 2 by RT PCR NEGATIVE NEGATIVE Final    Comment: (NOTE) SARS-CoV-2 target nucleic acids are NOT DETECTED.  The SARS-CoV-2 RNA is generally detectable in upper respiratory specimens during the acute phase of infection. The lowest concentration of SARS-CoV-2 viral copies this assay can detect is 138 copies/mL. A negative result does not preclude SARS-Cov-2 infection and should not be used as the sole basis for treatment or other patient management decisions. A negative result may occur with  improper specimen collection/handling, submission of specimen other than nasopharyngeal swab, presence of viral mutation(s) within the areas targeted by this assay, and inadequate number of viral copies(<138 copies/mL). A negative result must be combined with clinical observations, patient history, and epidemiological information. The expected result is Negative.  Fact Sheet for Patients:  BloggerCourse.com  Fact Sheet for Healthcare Providers:  SeriousBroker.it  This test is no t yet approved or cleared by the [place] and  has been authorized for detection and/or diagnosis of SARS-CoV-2 by FDA under an Emergency Use Authorization (EUA). This EUA will remain  in effect (meaning this test can be used) for the duration of the COVID-19 declaration under Section 564(b)(1) of the Act, 21 U.S.C.section 360bbb-3(b)(1), unless the authorization is terminated  or revoked sooner.       Influenza A by PCR NEGATIVE NEGATIVE Final   Influenza B by PCR NEGATIVE NEGATIVE Final    Comment:  (NOTE) The Xpert Xpress SARS-CoV-2/FLU/RSV plus assay is intended as an aid in the diagnosis of influenza from Nasopharyngeal swab specimens and should not be used as a sole basis for treatment. Nasal washings and aspirates are unacceptable for Xpert Xpress SARS-CoV-2/FLU/RSV testing.  Fact Sheet for Patients: BloggerCourse.com  Fact Sheet for Healthcare Providers: SeriousBroker.it  This test is not yet approved or cleared by the [place] FDA and has been authorized for detection and/or diagnosis of SARS-CoV-2 by FDA under an Emergency Use Authorization (EUA). This EUA will remain in effect (meaning this test can be used) for the duration of the COVID-19 declaration under Section 564(b)(1) of the Act, 21 U.S.C. section 360bbb-3(b)(1), unless the authorization is terminated or revoked.     Resp Syncytial Virus by PCR NEGATIVE NEGATIVE Final    Comment: (NOTE) Fact Sheet for Patients: BloggerCourse.com  Fact Sheet for Healthcare Providers: SeriousBroker.it  This test is not yet approved or cleared by the [place] FDA and has been authorized for detection and/or diagnosis of SARS-CoV-2 by FDA under an Emergency Use Authorization (EUA). This EUA will remain in effect (meaning this test can be used) for the duration of the COVID-19 declaration under Section 564(b)(1) of the Act, 21 U.S.C. section 360bbb-3(b)(1), unless the authorization is terminated or revoked.  Performed at [facility], [address] [place], [place] [address]    Studies/Results: MR ABDOMEN MRCP W WO CONTAST  Result Date: 01/14/2023 CLINICAL DATA:  Characterize intrahepatic biliary ductal dilatation identified by prior CT EXAM: MRI ABDOMEN WITHOUT AND WITH CONTRAST (INCLUDING MRCP) TECHNIQUE: Multiplanar multisequence MR imaging of the abdomen was performed both before and after the  administration of intravenous contrast. Heavily T2-weighted images of the biliary and pancreatic ducts were obtained, and three-dimensional MRCP images were rendered by post processing. CONTRAST:  7mL GADAVIST GADOBUTROL 1 MMOL/ML IV SOLN COMPARISON:  CT chest abdomen pelvis, 01/13/2023 FINDINGS: Lower chest: No acute abnormality.  Cardiomegaly. Hepatobiliary: No solid liver abnormality is seen. Small benign hepatic parenchymal cysts of the right lobe of the liver, for which no further follow-up or characterization is required (series 4, image 14). No gallstones or gallbladder wall thickening. Mild intra and extrahepatic biliary ductal dilatation, similar to prior examination, the common bile duct measuring up to 0.8 cm, however tapering smoothly to the ampulla without calculus or other obstruction. In the left lobe of the liver, there is a subcentimeter cyst or diverticulum involving a segmental bile duct of hepatic segment II measuring 0.7 cm (series 14, image 42). Pancreas: Unremarkable. No pancreatic ductal dilatation or surrounding inflammatory changes. Spleen: Normal in size without significant abnormality. Adrenals/Urinary Tract: Adrenal glands are unremarkable. Kidneys are normal, without renal calculi, solid lesion, or hydronephrosis. Stomach/Bowel: Stomach is within normal limits. No evidence of bowel wall thickening, distention, or inflammatory changes. Vascular/Lymphatic: No significant vascular findings are present. No enlarged abdominal lymph nodes. Other: No abdominal wall hernia or abnormality. No ascites.  Musculoskeletal: No acute or significant osseous findings. IMPRESSION: 1. Mild intra and extrahepatic biliary ductal dilatation, similar to prior examination, the common bile duct measuring up to 0.8 cm, however tapering smoothly to the ampulla without calculus or other obstruction. This is of uncertain significance. ERCP may be considered to further evaluate if there is clinical or biochemical  evidence of cholestasis. 2. In the left lobe of the liver, there is a solitary subcentimeter cyst or diverticulum involving a segmental bile duct of hepatic segment II measuring 0.7 cm, of uncertain nature or significance. In general, bile duct cysts and diverticula are thought to carry some lifetime risk of cholangiocarcinoma, and therefore hepatobiliary referral and surveillance may be warranted. Electronically Signed   By: Jearld Lesch M.D.   On: 01/14/2023 21:46   MR 3D Recon At Scanner  Result Date: 01/14/2023 CLINICAL DATA:  Characterize intrahepatic biliary ductal dilatation identified by prior CT EXAM: MRI ABDOMEN WITHOUT AND WITH CONTRAST (INCLUDING MRCP) TECHNIQUE: Multiplanar multisequence MR imaging of the abdomen was performed both before and after the administration of intravenous contrast. Heavily T2-weighted images of the biliary and pancreatic ducts were obtained, and three-dimensional MRCP images were rendered by post processing. CONTRAST:  7mL GADAVIST GADOBUTROL 1 MMOL/ML IV SOLN COMPARISON:  CT chest abdomen pelvis, 01/13/2023 FINDINGS: Lower chest: No acute abnormality.  Cardiomegaly. Hepatobiliary: No solid liver abnormality is seen. Small benign hepatic parenchymal cysts of the right lobe of the liver, for which no further follow-up or characterization is required (series 4, image 14). No gallstones or gallbladder wall thickening. Mild intra and extrahepatic biliary ductal dilatation, similar to prior examination, the common bile duct measuring up to 0.8 cm, however tapering smoothly to the ampulla without calculus or other obstruction. In the left lobe of the liver, there is a subcentimeter cyst or diverticulum involving a segmental bile duct of hepatic segment II measuring 0.7 cm (series 14, image 42). Pancreas: Unremarkable. No pancreatic ductal dilatation or surrounding inflammatory changes. Spleen: Normal in size without significant abnormality. Adrenals/Urinary Tract: Adrenal  glands are unremarkable. Kidneys are normal, without renal calculi, solid lesion, or hydronephrosis. Stomach/Bowel: Stomach is within normal limits. No evidence of bowel wall thickening, distention, or inflammatory changes. Vascular/Lymphatic: No significant vascular findings are present. No enlarged abdominal lymph nodes. Other: No abdominal wall hernia or abnormality. No ascites. Musculoskeletal: No acute or significant osseous findings. IMPRESSION: 1. Mild intra and extrahepatic biliary ductal dilatation, similar to prior examination, the common bile duct measuring up to 0.8 cm, however tapering smoothly to the ampulla without calculus or other obstruction. This is of uncertain significance. ERCP may be considered to further evaluate if there is clinical or biochemical evidence of cholestasis. 2. In the left lobe of the liver, there is a solitary subcentimeter cyst or diverticulum involving a segmental bile duct of hepatic segment II measuring 0.7 cm, of uncertain nature or significance. In general, bile duct cysts and diverticula are thought to carry some lifetime risk of cholangiocarcinoma, and therefore hepatobiliary referral and surveillance may be warranted. Electronically Signed   By: Jearld Lesch M.D.   On: 01/14/2023 21:46   Medications: I have reviewed the patient's current medications. Scheduled Meds:  enoxaparin (LOVENOX) injection  0.5 mg/kg Subcutaneous Q24H   gabapentin  300 mg Oral BID   propranolol  20 mg Oral BID   Continuous Infusions:  promethazine (PHENERGAN) injection (IM or IVPB) 12.5 mg (01/14/23 2243)   PRN Meds:.acetaminophen **OR** acetaminophen, ALPRAZolam, HYDROcodone-acetaminophen, promethazine (PHENERGAN) injection (IM or IVPB), promethazine, tiZANidine,  zolpidem   Assessment: Principal Problem:   Intractable pain Active Problems:   Headache, migraine   Spastic pelvic floor syndrome   GAD (generalized anxiety disorder)   Primary hypertension   Diabetes mellitus  without complication (HCC)   AKI (acute kidney injury) (HCC)   Intractable Abdominal pain   S/P screening colonoscopy 12/22/2022   Chronic daily headache/chronic migraine   TMJ arthralgia   Hypotension [name], Dr. is a 47 y.o. y/o female admitted to the hospital 11 days after her colonoscopy with abdominal pain discomfort diarrhea AKI.  She had initially presented the day after the procedure to the ER and no abnormalities were seen in the CAT scan except central hepatic dilation.  On repeat CT scan as well no other abnormalities have been found.  Liver function tests are completely normal seen by general surgery.  With a history of diarrhea abdominal discomfort very likely had an episode of infectious possibly viral gastroenteritis.It could be change in bowel flora of the colon vulnerable to infections   Plan 1.   GI PCR and stool testing to rule out infectious gastroenteritis with diarrhea only if she has diarrhea if the stools are solid do not perform stool tests.   2.  MRCP Shows biliary dilation but no clear obstruction tends to require an EUS as an outpatient which I will schedule at [facility].  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  Unclear if this is a type IV choledochocele.  I will refer her to [facility] as an outpatient to have the MRI reviewed by their hepatologists/surgeons to determine if further evaluation or intervention is required.  I do not believe that these incidental findings are related to her present issues with nausea vomiting abdominal pain 3.  Advance diet as tolerated when eating and drinking well and keeping it down should go home  I have discussed the plan with Dr. Mayford Knife    LOS: 0 days   Nancy Mood, MD 01/15/2023, 12:24 PM

## 2023-01-15 NOTE — Plan of Care (Signed)
Progressing towards all set goals

## 2023-01-16 DIAGNOSIS — R109 Unspecified abdominal pain: Secondary | ICD-10-CM | POA: Diagnosis not present

## 2023-01-16 DIAGNOSIS — G43809 Other migraine, not intractable, without status migrainosus: Secondary | ICD-10-CM | POA: Diagnosis not present

## 2023-01-16 DIAGNOSIS — R52 Pain, unspecified: Secondary | ICD-10-CM | POA: Diagnosis not present

## 2023-01-16 MED ORDER — HYDROCODONE-ACETAMINOPHEN 5-325 MG PO TABS: 1.0000 | ORAL_TABLET | Freq: Four times a day (QID) | ORAL | 0 refills | Status: AC | PRN

## 2023-01-16 MED ORDER — PROMETHAZINE HCL 25 MG PO TABS: 25.0000 mg | ORAL_TABLET | Freq: Four times a day (QID) | ORAL | 0 refills | Status: DC | PRN

## 2023-01-16 MED ORDER — ZOLPIDEM TARTRATE 5 MG PO TABS: 5.0000 mg | ORAL_TABLET | Freq: Every evening | ORAL | 0 refills | Status: DC | PRN

## 2023-01-16 NOTE — Plan of Care (Signed)

## 2023-01-16 NOTE — Progress Notes (Signed)
Reviewed discharge notes with patient. Patient acknowledged understanding. Patient discharged with personal belongings. Patient wheeled out by staff. No distress in patient observed. Patient transported via family v\vehicle.

## 2023-01-16 NOTE — Discharge Summary (Signed)
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 Difficile Quick Screen w PCR reflex     Status: None   Collection Time: 01/13/23  9:42 AM   Specimen: STOOL  Result Value Ref Range Status   C Diff antigen NEGATIVE NEGATIVE Final   C Diff toxin NEGATIVE NEGATIVE Final   C Diff interpretation No C. difficile detected.  Final    Comment: Performed at [facility], [address] [place], [place] [address]  Gastrointestinal Panel by PCR , Stool     Status: None   Collection Time: 01/13/23  9:42 AM   Specimen: Stool  Result Value Ref Range Status   Campylobacter species NOT DETECTED NOT DETECTED Final   Plesimonas shigelloides NOT DETECTED NOT DETECTED Final   Salmonella species NOT DETECTED NOT DETECTED Final   Yersinia enterocolitica NOT  DETECTED NOT DETECTED Final   Vibrio species NOT DETECTED NOT DETECTED Final   Vibrio cholerae NOT DETECTED NOT DETECTED Final   Enteroaggregative E coli (EAEC) NOT DETECTED NOT DETECTED Final   Enteropathogenic E coli (EPEC) NOT DETECTED NOT DETECTED Final   Enterotoxigenic E coli (ETEC) NOT DETECTED NOT DETECTED Final   Shiga like toxin producing E coli (STEC) NOT DETECTED NOT DETECTED Final   Shigella/Enteroinvasive E coli (EIEC) NOT DETECTED NOT DETECTED Final   Cryptosporidium NOT DETECTED NOT DETECTED Final   Cyclospora cayetanensis NOT DETECTED NOT DETECTED Final   Entamoeba histolytica NOT DETECTED NOT DETECTED Final   Giardia lamblia NOT DETECTED NOT DETECTED Final   Adenovirus F40/41 NOT DETECTED NOT DETECTED  Final   Astrovirus NOT DETECTED NOT DETECTED Final   Norovirus GI/GII NOT DETECTED NOT DETECTED Final   Rotavirus A NOT DETECTED NOT DETECTED Final   Sapovirus (I, II, IV, and V) NOT DETECTED NOT DETECTED Final    Comment: Performed at [facility], [address] [place], [place] [address]     Labs: BNP (last 3 results) No results for input(s): "BNP" in the last 8760 hours. Basic Metabolic Panel: Recent Labs  Lab 01/12/23 2200 01/13/23 0328 01/14/23 0830  NA 139 137 139  K 3.7 3.7 3.7  CL 106 104 106  CO2 23 26 26   GLUCOSE 81 96 85  BUN 15 12 5*  CREATININE 1.84* 1.29* 0.80  CALCIUM 8.6* 8.1* 8.6*  MG  --   --  1.8   Liver Function Tests: Recent Labs  Lab 01/12/23 2200 01/13/23 0328  AST 17 15  ALT 13 13  ALKPHOS 50 46  BILITOT 0.6 0.6  PROT 6.8 6.3*  ALBUMIN 4.0 3.7   No results for input(s): "LIPASE", "AMYLASE" in the last 168 hours. No results for input(s): "AMMONIA" in the last 168 hours. CBC: Recent Labs  Lab 01/12/23 2200 01/13/23 0328  WBC 9.3 9.1  NEUTROABS 5.3  --   HGB 12.6 12.5  HCT 37.3 36.8  MCV 91.4 92.0  PLT 336 325   Cardiac Enzymes: No results for input(s): "CKTOTAL", "CKMB", "CKMBINDEX", "TROPONINI" in the  last 168 hours. BNP: Invalid input(s): "POCBNP" CBG: No results for input(s): "GLUCAP" in the last 168 hours. D-Dimer No results for input(s): "DDIMER" in the last 72 hours. Hgb A1c No results for input(s): "HGBA1C" in the last 72 hours. Lipid Profile No results for input(s): "CHOL", "HDL", "LDLCALC", "TRIG", "CHOLHDL", "LDLDIRECT" in the last 72 hours. Thyroid function studies No results for input(s): "TSH", "T4TOTAL", "T3FREE", "THYROIDAB" in the last 72 hours.  Invalid input(s): "FREET3" Anemia work up No results for input(s): "VITAMINB12", "FOLATE", "FERRITIN", "TIBC", "IRON", "RETICCTPCT" in the last 72 hours. Urinalysis    Component Value Date/Time   COLORURINE COLORLESS (A) 01/13/2023 1201   APPEARANCEUR CLEAR (A) 01/13/2023 1201   LABSPEC 1.006 01/13/2023 1201   PHURINE 7.0 01/13/2023 1201   GLUCOSEU 50 (A) 01/13/2023 1201   HGBUR NEGATIVE 01/13/2023 1201   BILIRUBINUR NEGATIVE 01/13/2023 1201   KETONESUR NEGATIVE 01/13/2023 1201   PROTEINUR NEGATIVE 01/13/2023 1201   UROBILINOGEN 0.2 12/15/2008 1019   NITRITE NEGATIVE 01/13/2023 1201   LEUKOCYTESUR NEGATIVE 01/13/2023 1201   Sepsis Labs Recent Labs  Lab 01/12/23 2200 01/13/23 0328  WBC 9.3 9.1   Microbiology Recent Results (from the past 240 hour(s))  Blood Culture (routine x 2)     Status: None (Preliminary result)   Collection Time: 01/13/23  3:28 AM   Specimen: BLOOD  Result Value Ref Range Status   Specimen Description BLOOD RIGHT ARM  Final   Special Requests   Final    BOTTLES DRAWN AEROBIC AND ANAEROBIC Blood Culture results may not be optimal due to an inadequate volume of blood received in culture bottles   Culture   Final    NO GROWTH 3 DAYS Performed at [facility], [address] [place], [place] [address]    Report Status PENDING  Incomplete  Blood Culture (routine x 2)     Status: None (Preliminary result)   Collection Time: 01/13/23  3:30 AM   Specimen: BLOOD  Result Value  Ref Range Status   Specimen Description BLOOD LEFT ARM  Final  Special Requests   Final    BOTTLES DRAWN AEROBIC AND ANAEROBIC Blood Culture results may not be optimal due to an inadequate volume of blood received in culture bottles   Culture   Final    NO GROWTH 3 DAYS Performed at [facility], [address] [place], [place] [address]    Report Status PENDING  Incomplete  Resp panel by RT-PCR (RSV, Flu A&B, Covid) Anterior Nasal Swab     Status: None   Collection Time: 01/13/23  9:34 AM   Specimen: Anterior Nasal Swab  Result Value Ref Range Status   SARS Coronavirus 2 by RT PCR NEGATIVE NEGATIVE Final    Comment: (NOTE) SARS-CoV-2 target nucleic acids are NOT DETECTED.  The SARS-CoV-2 RNA is generally detectable in upper respiratory specimens during the acute phase of infection. The lowest concentration of SARS-CoV-2 viral copies this assay can detect is 138 copies/mL. A negative result does not preclude SARS-Cov-2 infection and should not be used as the sole basis for treatment or other patient management decisions. A negative result may occur with  improper specimen collection/handling, submission of specimen other than nasopharyngeal swab, presence of viral mutation(s) within the areas targeted by this assay, and inadequate number of viral copies(<138 copies/mL). A negative result must be combined with clinical observations, patient history, and epidemiological information. The expected result is Negative.  Fact Sheet for Patients:  BloggerCourse.com  Fact Sheet for Healthcare Providers:  SeriousBroker.it  This test is no t yet approved or cleared by the [place] FDA and  has been authorized for detection and/or diagnosis of SARS-CoV-2 by FDA under an Emergency Use Authorization (EUA). This EUA will remain  in effect (meaning this test can be used) for the duration of the COVID-19 declaration under  Section 564(b)(1) of the Act, 21 U.S.C.section 360bbb-3(b)(1), unless the authorization is terminated  or revoked sooner.       Influenza A by PCR NEGATIVE NEGATIVE Final   Influenza B by PCR NEGATIVE NEGATIVE Final    Comment: (NOTE) The Xpert Xpress SARS-CoV-2/FLU/RSV plus assay is intended as an aid in the diagnosis of influenza from Nasopharyngeal swab specimens and should not be used as a sole basis for treatment. Nasal washings and aspirates are unacceptable for Xpert Xpress SARS-CoV-2/FLU/RSV testing.  Fact Sheet for Patients: BloggerCourse.com  Fact Sheet for Healthcare Providers: SeriousBroker.it  This test is not yet approved or cleared by the [place] FDA and has been authorized for detection and/or diagnosis of SARS-CoV-2 by FDA under an Emergency Use Authorization (EUA). This EUA will remain in effect (meaning this test can be used) for the duration of the COVID-19 declaration under Section 564(b)(1) of the Act, 21 U.S.C. section 360bbb-3(b)(1), unless the authorization is terminated or revoked.     Resp Syncytial Virus by PCR NEGATIVE NEGATIVE Final    Comment: (NOTE) Fact Sheet for Patients: BloggerCourse.com  Fact Sheet for Healthcare Providers: SeriousBroker.it  This test is not yet approved or cleared by the [place] FDA and has been authorized for detection and/or diagnosis of SARS-CoV-2 by FDA under an Emergency Use Authorization (EUA). This EUA will remain in effect (meaning this test can be used) for the duration of the COVID-19 declaration under Section 564(b)(1) of the Act, 21 U.S.C. section 360bbb-3(b)(1), unless the authorization is terminated or revoked.  Performed at [facility], [address] [place], [place] [address]   C Difficile Quick Screen w PCR reflex     Status: None   Collection  Time: 01/13/23  9:42 AM    Specimen: STOOL  Result Value Ref Range Status   C Diff antigen NEGATIVE NEGATIVE Final   C Diff toxin NEGATIVE NEGATIVE Final   C Diff interpretation No C. difficile detected.  Final    Comment: Performed at [facility], [address] [place], [place] [address]  Gastrointestinal Panel by PCR , Stool     Status: None   Collection Time: 01/13/23  9:42 AM   Specimen: Stool  Result Value Ref Range Status   Campylobacter species NOT DETECTED NOT DETECTED Final   Plesimonas shigelloides NOT DETECTED NOT DETECTED Final   Salmonella species NOT DETECTED NOT DETECTED Final   Yersinia enterocolitica NOT DETECTED NOT DETECTED Final   Vibrio species NOT DETECTED NOT DETECTED Final   Vibrio cholerae NOT DETECTED NOT DETECTED Final   Enteroaggregative E coli (EAEC) NOT DETECTED NOT DETECTED Final   Enteropathogenic E coli (EPEC) NOT DETECTED NOT DETECTED Final   Enterotoxigenic E coli (ETEC) NOT DETECTED NOT DETECTED Final   Shiga like toxin producing E coli (STEC) NOT DETECTED NOT DETECTED Final   Shigella/Enteroinvasive E coli (EIEC) NOT DETECTED NOT DETECTED Final   Cryptosporidium NOT DETECTED NOT DETECTED Final   Cyclospora cayetanensis NOT DETECTED NOT DETECTED Final   Entamoeba histolytica NOT DETECTED NOT DETECTED Final   Giardia lamblia NOT DETECTED NOT DETECTED Final   Adenovirus F40/41 NOT DETECTED NOT DETECTED Final   Astrovirus NOT DETECTED NOT DETECTED Final   Norovirus GI/GII NOT DETECTED NOT DETECTED Final   Rotavirus A NOT DETECTED NOT DETECTED Final   Sapovirus (I, II, IV, and V) NOT DETECTED NOT DETECTED Final    Comment: Performed at [facility], [address], [place], [place] [address]     Time coordinating discharge: Over 30 minutes  SIGNED:   Charise Killian, MD  [facility] 01/16/2023, 10:03 AM Pager   If 7PM-7AM, please contact night-coverage [email or web address]

## 2023-01-18 LAB — CULTURE, BLOOD (ROUTINE X 2)
Culture: NO GROWTH
Culture: NO GROWTH

## 2023-02-03 ENCOUNTER — Encounter: Payer: Self-pay | Admitting: Gastroenterology

## 2023-02-03 ENCOUNTER — Ambulatory Visit (INDEPENDENT_AMBULATORY_CARE_PROVIDER_SITE_OTHER): Payer: BC Managed Care – PPO | Admitting: Gastroenterology

## 2023-02-03 VITALS — BP 145/89 | HR 86 | Temp 98.0°F | Ht 65.5 in | Wt 170.0 lb

## 2023-02-03 DIAGNOSIS — K838 Other specified diseases of biliary tract: Secondary | ICD-10-CM | POA: Diagnosis not present

## 2023-02-03 DIAGNOSIS — Z8719 Personal history of other diseases of the digestive system: Secondary | ICD-10-CM | POA: Diagnosis not present

## 2023-02-03 DIAGNOSIS — R933 Abnormal findings on diagnostic imaging of other parts of digestive tract: Secondary | ICD-10-CM | POA: Diagnosis not present

## 2023-02-03 NOTE — Patient Instructions (Addendum)
Please take charcoal tablets 50 to 100 grams orally every 4 to 6 hours as needed.  We will refer you to [facility]. The will be contacting you to schedule your appointment. Please give them about two weeks to reach out to you.

## 2023-02-03 NOTE — Progress Notes (Addendum)
Nancy Mood MD, MRCP(U.K) [address]  [address]  [place], [place] [address]  Main: [phone]  Fax: [phone]   Primary Care Physician: Wilmon Pali, MD  Primary Gastroenterologist:  Dr. Wyline Pearson   Chief Complaint  Patient presents with   Hospitalization Follow-up    HPI: [facility], Dr. is a 47 y.o. female was admitted to the hospital 11 days after her colonoscopy with abdominal pain,diarrhea ,AKI.  She had initially presented the day after the procedure to the ER and no abnormalities were seen in the CAT scan except central hepatic dilation.  On repeat CT scan as well no other abnormalities have been found.  Liver function tests are completely normal seen by general surgery.  With a history of diarrhea abdominal discomfort very likely had an episode of infectious possibly viral gastroenteritis.It could have been a change in bowel flora of the colon vulnerable to infections . During the process of evaluation of abdominal pain , imaging showed biliary tract abnormalities and MRCP Shows biliary dilation but no clear obstruction.   She is here today to discuss about the results. Continues to have right lower quadrant discomfort feels like gas improves when she passes gas.  Consuming some artificial sugars in her diet.  Denies any diarrhea. Current Outpatient Medications  Medication Sig Dispense Refill   acetaminophen (TYLENOL) 325 MG tablet Take 650 mg by mouth every 6 (six) hours as needed.     ascorbic acid (VITAMIN C) 500 MG tablet Take 1,000 mg by mouth daily.     gabapentin (NEURONTIN) 300 MG capsule Take two tablets qhs 90 capsule 3   Galcanezumab-gnlm (EMGALITY) 120 MG/ML SOSY INJECT 120 MG UNDER THE SKIN EVERY 28 DAYS     losartan (COZAAR) 50 MG tablet Take 50 mg by mouth daily.     metFORMIN (GLUCOPHAGE) 500 MG tablet TAKE 1 TABLET (500 MG TOTAL) BY MOUTH DAILY. 90 tablet 1   Multiple Vitamins-Minerals (MULTIVITAL PO) Take 30 mLs by mouth daily.      norethindrone (AYGESTIN) 5 MG tablet Take 1 tablet (5 mg total) by mouth daily.     promethazine (PHENERGAN) 25 MG tablet Take 1 tablet (25 mg total) by mouth every 6 (six) hours as needed for nausea or vomiting. 30 tablet 0   propranolol (INDERAL) 20 MG tablet Take 20-40 mg by mouth 2 (two) times daily. Monitor blood pressure and take as needed 20-40 mg up to twice a day.     Semaglutide,0.25 or 0.5MG /DOS, 2 MG/3ML SOPN Inject 0.5 mg [place] weekly 9 mL 1   tiZANidine (ZANAFLEX) 4 MG tablet Take 1 tablet (4 mg total) by mouth every 8 (eight) hours as needed for muscle spasms. 30 tablet 0   Ubrogepant 100 MG TABS Take 100 mg by mouth as directed. Take 1 tablet (100 mg total) by mouth daily as needed. Take 1 tablet by mouth at ONSET of migraine or aura. May repeat a second dose in 2 HOURS if needed. DO NOT TAKE MORE than 2 doses a day, 2 days a week, or 8 days a month.     zolpidem (AMBIEN) 5 MG tablet Take 1 tablet (5 mg total) by mouth at bedtime as needed for sleep. 30 tablet 0   ALPRAZolam (XANAX) 0.5 MG tablet Take 1 tablet (0.5 mg total) by mouth daily as needed for anxiety. (Patient not taking: Reported on 02/03/2023) 30 tablet 0   No current facility-administered medications for this visit.    Allergies as of  02/03/2023 - Review Complete 02/03/2023  Allergen Reaction Noted   Penicillins Anaphylaxis and Itching    Botox [onabotulinumtoxina] Other (See Comments) 01/13/2023   Percocet [oxycodone-acetaminophen] Nausea And Vomiting 12/18/2017   Naproxen Swelling 05/29/2015   Ibuprofen Rash 05/29/2015   Tramadol Rash 01/11/2016       ROS:  General: Negative for anorexia, weight loss, fever, chills, fatigue, weakness. ENT: Negative for hoarseness, difficulty swallowing , nasal congestion. CV: Negative for chest pain, angina, palpitations, dyspnea on exertion, peripheral edema.  Respiratory: Negative for dyspnea at rest, dyspnea on exertion, cough, sputum, wheezing.  GI: See history of  present illness. GU:  Negative for dysuria, hematuria, urinary incontinence, urinary frequency, nocturnal urination.  Endo: Negative for unusual weight change.    Physical Examination:   BP (!) 145/89   Pulse 86   Temp 98 F (36.7 C) (Oral)   Ht 5' 5.5" (1.664 m)   Wt 170 lb (77.1 kg)   BMI 27.86 kg/m   General: Well-nourished, well-developed in no acute distress.  Eyes: No icterus. Conjunctivae pink. Psych: Alert and cooperative, normal Pearson and affect.   Imaging Studies: MR ABDOMEN MRCP W WO CONTAST  Result Date: 01/14/2023 CLINICAL DATA:  Characterize intrahepatic biliary ductal dilatation identified by prior CT EXAM: MRI ABDOMEN WITHOUT AND WITH CONTRAST (INCLUDING MRCP) TECHNIQUE: Multiplanar multisequence MR imaging of the abdomen was performed both before and after the administration of intravenous contrast. Heavily T2-weighted images of the biliary and pancreatic ducts were obtained, and three-dimensional MRCP images were rendered by post processing. CONTRAST:  7mL GADAVIST GADOBUTROL 1 MMOL/ML IV SOLN COMPARISON:  CT chest abdomen pelvis, 01/13/2023 FINDINGS: Lower chest: No acute abnormality.  Cardiomegaly. Hepatobiliary: No solid liver abnormality is seen. Small benign hepatic parenchymal cysts of the right lobe of the liver, for which no further follow-up or characterization is required (series 4, image 14). No gallstones or gallbladder wall thickening. Mild intra and extrahepatic biliary ductal dilatation, similar to prior examination, the common bile duct measuring up to 0.8 cm, however tapering smoothly to the ampulla without calculus or other obstruction. In the left lobe of the liver, there is a subcentimeter cyst or diverticulum involving a segmental bile duct of hepatic segment II measuring 0.7 cm (series 14, image 42). Pancreas: Unremarkable. No pancreatic ductal dilatation or surrounding inflammatory changes. Spleen: Normal in size without significant abnormality.  Adrenals/Urinary Tract: Adrenal glands are unremarkable. Kidneys are normal, without renal calculi, solid lesion, or hydronephrosis. Stomach/Bowel: Stomach is within normal limits. No evidence of bowel wall thickening, distention, or inflammatory changes. Vascular/Lymphatic: No significant vascular findings are present. No enlarged abdominal lymph nodes. Other: No abdominal wall hernia or abnormality. No ascites. Musculoskeletal: No acute or significant osseous findings. IMPRESSION: 1. Mild intra and extrahepatic biliary ductal dilatation, similar to prior examination, the common bile duct measuring up to 0.8 cm, however tapering smoothly to the ampulla without calculus or other obstruction. This is of uncertain significance. ERCP may be considered to further evaluate if there is clinical or biochemical evidence of cholestasis. 2. In the left lobe of the liver, there is a solitary subcentimeter cyst or diverticulum involving a segmental bile duct of hepatic segment II measuring 0.7 cm, of uncertain nature or significance. In general, bile duct cysts and diverticula are thought to carry some lifetime risk of cholangiocarcinoma, and therefore hepatobiliary referral and surveillance may be warranted. Electronically Signed   By: Jearld Lesch M.D.   On: 01/14/2023 21:46   MR 3D Recon At Scanner  Result  Date: 01/14/2023 CLINICAL DATA:  Characterize intrahepatic biliary ductal dilatation identified by prior CT EXAM: MRI ABDOMEN WITHOUT AND WITH CONTRAST (INCLUDING MRCP) TECHNIQUE: Multiplanar multisequence MR imaging of the abdomen was performed both before and after the administration of intravenous contrast. Heavily T2-weighted images of the biliary and pancreatic ducts were obtained, and three-dimensional MRCP images were rendered by post processing. CONTRAST:  7mL GADAVIST GADOBUTROL 1 MMOL/ML IV SOLN COMPARISON:  CT chest abdomen pelvis, 01/13/2023 FINDINGS: Lower chest: No acute abnormality.  Cardiomegaly.  Hepatobiliary: No solid liver abnormality is seen. Small benign hepatic parenchymal cysts of the right lobe of the liver, for which no further follow-up or characterization is required (series 4, image 14). No gallstones or gallbladder wall thickening. Mild intra and extrahepatic biliary ductal dilatation, similar to prior examination, the common bile duct measuring up to 0.8 cm, however tapering smoothly to the ampulla without calculus or other obstruction. In the left lobe of the liver, there is a subcentimeter cyst or diverticulum involving a segmental bile duct of hepatic segment II measuring 0.7 cm (series 14, image 42). Pancreas: Unremarkable. No pancreatic ductal dilatation or surrounding inflammatory changes. Spleen: Normal in size without significant abnormality. Adrenals/Urinary Tract: Adrenal glands are unremarkable. Kidneys are normal, without renal calculi, solid lesion, or hydronephrosis. Stomach/Bowel: Stomach is within normal limits. No evidence of bowel wall thickening, distention, or inflammatory changes. Vascular/Lymphatic: No significant vascular findings are present. No enlarged abdominal lymph nodes. Other: No abdominal wall hernia or abnormality. No ascites. Musculoskeletal: No acute or significant osseous findings. IMPRESSION: 1. Mild intra and extrahepatic biliary ductal dilatation, similar to prior examination, the common bile duct measuring up to 0.8 cm, however tapering smoothly to the ampulla without calculus or other obstruction. This is of uncertain significance. ERCP may be considered to further evaluate if there is clinical or biochemical evidence of cholestasis. 2. In the left lobe of the liver, there is a solitary subcentimeter cyst or diverticulum involving a segmental bile duct of hepatic segment II measuring 0.7 cm, of uncertain nature or significance. In general, bile duct cysts and diverticula are thought to carry some lifetime risk of cholangiocarcinoma, and therefore  hepatobiliary referral and surveillance may be warranted. Electronically Signed   By: Jearld Lesch M.D.   On: 01/14/2023 21:46   CT CHEST ABDOMEN PELVIS W CONTRAST  Result Date: 01/13/2023 CLINICAL DATA:  Acute nonlocalized abdominal pain. Recent endoscopy. Rigid abdomen on exam. Rebound tenderness follow-through upper abdomen wrapping around to back. Low blood pressures. EXAM: CT CHEST, ABDOMEN, AND PELVIS WITH CONTRAST TECHNIQUE: Multidetector CT imaging of the chest, abdomen and pelvis was performed following the standard protocol during bolus administration of intravenous contrast. RADIATION DOSE REDUCTION: This exam was performed according to the departmental dose-optimization program which includes automated exposure control, adjustment of the mA and/or kV according to patient size and/or use of iterative reconstruction technique. CONTRAST:  80mL OMNIPAQUE IOHEXOL 300 MG/ML  SOLN COMPARISON:  CT abdomen and pelvis 01/02/2023 and chest radiograph 05/20/2021 FINDINGS: CT CHEST FINDINGS Cardiovascular: Normal heart size. No pericardial effusion. Normal caliber thoracic aorta. Mediastinum/Nodes: Esophagus is unremarkable. Patent trachea. No mediastinal fluid or gas. No adenopathy. Lungs/Pleura: No focal consolidation, pleural effusion, or pneumothorax. Musculoskeletal: No acute rib fracture. CT ABDOMEN PELVIS FINDINGS Hepatobiliary: Unchanged mild central intrahepatic biliary dilation with focal dilation in the left hepatic lobe on series 2/image 45. Hepatic cyst in the right hepatic lobe are unchanged. Pancreas: Unremarkable. Spleen: Unremarkable. Adrenals/Urinary Tract: Normal adrenal glands the kidneys. No urinary calculi or hydronephrosis.  Unremarkable bladder. Stomach/Bowel: Moderate stool in the rectum. No bowel obstruction or bowel wall thickening. Stomach and appendix are within normal limits. Vascular/Lymphatic: No significant vascular findings are present. No enlarged abdominal or pelvic lymph  nodes. Reproductive: Uterus and bilateral adnexa are unremarkable. Other: No free intraperitoneal fluid or air. Musculoskeletal: No acute fracture. IMPRESSION: 1. No acute abnormality in the chest, abdomen, or pelvis. 2. Unchanged mild central intrahepatic biliary dilation with focal dilation in the left hepatic lobe. Recommend correlation with LFTs and nonemergent MRI/MRCP. Electronically Signed   By: Minerva Fester M.D.   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 During the process of evaluation MRCP showed intrahepatic biliary dilation with normal LFT's. Concern for CBD dilation 8 mm but no clear obstruction , in addition in the left lobe of the liver there is a solitary subcentimeter cyst or diverticulum involving segmental bile duct or hepatic segment of unclear significance unclear if this is a choledochal cyst  Plan  LFT's Refer to [facility] or [facility] for possible cholangioscopy and further evaluation .  Unclear if the diverticulum within the liver is a choledochal cyst type IV which would require surgical evaluation.  In addition common bile duct is dilated but no clear obstruction seen may benefit from EUS to be performed at the same time of as possible cholangioscopy Advised to stop all artificial sugars and sweeteners.  Trial of charcoal tablets.  Advised her not to use charcoal tablets with other medications Basit with 2 to 3 hours time.    Dr Nancy Mood  MD,MRCP [facility]) Follow up in 3 months  Addendum 02/13/2023 1:54 PM  Patient informed us on review of my office note above that she did not have diarrhea during her recent hospitalization.  This addendum is to correct and note the fact that the patient denies she had diarrhea.  Chart review shows that stool tests were  performed on 01/13/2023.   Dr Nancy Mood MD,MRCP [facility]) Gastroenterology/Hepatology Pager: [phone]

## 2023-02-13 ENCOUNTER — Ambulatory Visit
Admission: EM | Admit: 2023-02-13 | Discharge: 2023-02-13 | Disposition: A | Discharge status: Home / Self Care | Payer: BC Managed Care – PPO | Attending: Emergency Medicine | Admitting: Emergency Medicine

## 2023-02-13 DIAGNOSIS — R509 Fever, unspecified: Secondary | ICD-10-CM

## 2023-02-13 DIAGNOSIS — B9789 Other viral agents as the cause of diseases classified elsewhere: Secondary | ICD-10-CM | POA: Diagnosis not present

## 2023-02-13 DIAGNOSIS — H66003 Acute suppurative otitis media without spontaneous rupture of ear drum, bilateral: Secondary | ICD-10-CM | POA: Diagnosis not present

## 2023-02-13 DIAGNOSIS — J988 Other specified respiratory disorders: Secondary | ICD-10-CM | POA: Diagnosis not present

## 2023-02-13 MED ORDER — CEFDINIR 300 MG PO CAPS: 300.0000 mg | ORAL_CAPSULE | Freq: Two times a day (BID) | ORAL | 0 refills | Status: AC

## 2023-02-13 MED ORDER — ACETAMINOPHEN 500 MG PO TABS: 1000.0000 mg | ORAL_TABLET | Freq: Three times a day (TID) | ORAL | 0 refills | Status: AC | PRN

## 2023-02-13 NOTE — Discharge Instructions (Addendum)
Please read below to learn more about the medications, dosages and frequencies that I recommend to help alleviate your symptoms and to get you feeling better soon:   Omnicef (cefdinir):  Please take one (1) dose twice daily for 10 days.  This antibiotic can cause upset stomach, this will resolve once antibiotics are complete.  You are welcome to take a probiotic, eat yogurt, take Imodium while taking this medication.  Please avoid other systemic medications such as Maalox, Pepto-Bismol or milk of magnesia as they can interfere with the body's ability to absorb the antibiotics.  Robitussin, Mucinex (guaifenesin): This is an expectorant.  This single symptom reliever helps break up chest congestion and loosen up thick nasal drainage making phlegm and drainage easier to cough up and to blow out from your nose.  I recommend taking 400 mg in either liquid or tablet form three times daily as needed.  I do not recommend the 12-hour extended relief version or doses higher than 400 mg per each dose as these often make some patients feel jittery or jumpy and can interfere with sleep.  I also do not recommend that you purchase guaifenesin with the ingredient " DM" which is dextromethorphan, a cough suppressant and that you plan on taking it at bedtime.  Guaifenesin 400 mg is a safe dose for people who are being treated for high blood pressure.  This medication is available over-the-counter.  Because you have active prescriptions for gabapentin, Phenergan, Xanax, hydrocodone, and zolpidem, we cannot safely prescribe you a muscle relaxer at this time.  While you are welcome to take any of those medications to help you sleep, I strongly discourage you from you taking Xanax or hydrocodone at this time because both will reduce your respiratory rate as well as your depth of breathing and can set you up for pneumonia.   If symptoms have not meaningfully improved in the next 5 to 7 days, please return for repeat evaluation or  follow-up with your regular provider.  If symptoms have worsened in the next 3 to 5 days, please return for repeat evaluation or follow-up with your regular provider.    Thank you for visiting urgent care today.  We appreciate the opportunity to participate in your care.

## 2023-02-13 NOTE — ED Provider Notes (Signed)
Renaldo Fiddler    CSN: [id number] Arrival date & time: 02/13/23  9528    HISTORY   Chief Complaint  Patient presents with   Cough   HPI [facility], Dr. is a pleasant, 47 y.o. female who presents to urgent care today. Patient complains of fever, intense body aches, weakness, fatigue, dizziness, "racking" cough, sore throat, for the past 5 days.  Patient states she has been taking TheraFlu, Benadryl and Tylenol 650 mg twice daily without meaningful relief of symptoms.  PDMP reviewed, patient has multiple, active prescriptions for Xanax, hydrocodone, gabapentin, zolpidem prescribed by multiple doctors in various states and paid for both privately and through her health insurance.  Patient is requesting narcotics and muscle relaxers for relief of her body aches at this time.  The history is provided by the patient.   Past Medical History:  Diagnosis Date   Acute recurrent maxillary sinusitis 03/01/2015   AD (atopic dermatitis) 06/23/2013   Anxiety    Arthralgia of temporomandibular joint 12/19/2013   B12 deficiency 06/20/2016   Bilateral occipital neuralgia 03/21/2017   Chicken pox    Migraine headache    Ovarian cyst 10/2014   right   Overweight 11/22/2018   TMJ click 05/12/2012   Patient Active Problem List   Diagnosis Date Noted   Intractable pain 01/13/2023   AKI (acute kidney injury) (HCC) 01/13/2023   Intractable Abdominal pain 01/13/2023   S/P screening colonoscopy 12/22/2022 01/13/2023   Hypotension 01/13/2023   Elevated hematocrit 12/02/2021   Diabetes mellitus without complication (HCC) 08/14/2021   Microalbuminuria 08/14/2021   Mixed hyperlipidemia 08/14/2021   Endometriosis 08/13/2021   Intractable migraine without aura and with status migrainosus 08/13/2021   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   GAD (generalized anxiety disorder) 08/13/2021   Obesity (BMI 30-39.9) 08/13/2021   Primary hypertension 08/13/2021   Allergy to food  08/13/2021   Screening for colon cancer 08/13/2021   Vitamin D deficiency 08/13/2021   Anemia due to vitamin B12 deficiency 08/13/2021   Chronic daily headache/chronic migraine 02/05/2017   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 ENDOSCOPY;  Service: Gastroenterology;  Laterality: N/A;  Would prefer arrival time at 10 am.  Has to drop her daughter off to school.   LAPAROSCOPIC OVARIAN CYSTECTOMY Right 12/18/2017   Procedure: Diagnostic LAPAROSCOPY/Excision of OVARIAN Mass;  Surgeon: Maxie Better, MD;  Location: [facility] ORS;  Service: Gynecology;  Laterality: Right;   TONSILLECTOMY  1980   OB History     Gravida  1   Para  1   Term  1   Preterm  0   AB  0   Living  1      SAB  0   IAB  0   Ectopic  0   Multiple  0   Live Births  1          Home Medications    Prior to Admission medications   Medication Sig Start Date End Date Taking? Authorizing Provider  acetaminophen (TYLENOL) 325 MG tablet Take 650 mg by mouth every 6 (six) hours as needed.   Yes [provider]  acetaminophen (TYLENOL) 500 MG tablet Take 2 tablets (1,000 mg total) by mouth every 8 (eight) hours as needed for up to 30  doses for mild pain (pain score 1-3) or fever. 02/13/23  Yes Theadora Rama Scales, PA-C  ascorbic acid (VITAMIN C) 500 MG tablet Take 1,000 mg by mouth daily.   Yes [provider]  cefdinir (OMNICEF) 300 MG capsule Take 1 capsule (300 mg total) by mouth 2 (two) times daily for 10 days. 02/13/23 02/23/23 Yes Theadora Rama Scales, PA-C  gabapentin (NEURONTIN) 300 MG capsule Take two tablets qhs 08/13/21  Yes Dugal, Tabitha, FNP  losartan (COZAAR) 50 MG tablet Take 50 mg by mouth daily.   Yes [provider]  metFORMIN (GLUCOPHAGE) 500 MG tablet TAKE 1 TABLET (500 MG TOTAL) BY MOUTH DAILY. 11/27/21 02/13/23 Yes Dugal, Wyatt Mage, FNP  norethindrone (AYGESTIN) 5 MG tablet Take 1 tablet (5 mg total) by mouth daily. 08/13/21  Yes Dugal, Wyatt Mage, FNP  propranolol (INDERAL) 20 MG tablet Take 20-40 mg by mouth 2 (two) times daily. Monitor blood pressure and take as needed 20-40 mg up to twice a day. 01/06/23  Yes [provider]  Semaglutide,0.25 or 0.5MG /DOS, 2 MG/3ML SOPN Inject 0.5 mg [place] weekly 11/27/21  Yes Dugal, Tabitha, FNP  tiZANidine (ZANAFLEX) 4 MG tablet Take 1 tablet (4 mg total) by mouth every 8 (eight) hours as needed for muscle spasms. 07/25/22 07/25/23 Yes Delton Prairie, MD  Ubrogepant 100 MG TABS Take 100 mg by mouth as directed. Take 1 tablet (100 mg total) by mouth daily as needed. Take 1 tablet by mouth at ONSET of migraine or aura. May repeat a second dose in 2 HOURS if needed. DO NOT TAKE MORE than 2 doses a day, 2 days a week, or 8 days a month. 06/22/21  Yes [provider]  zolpidem (AMBIEN) 5 MG tablet Take 1 tablet (5 mg total) by mouth at bedtime as needed for sleep. 01/16/23 02/15/23 Yes Charise Killian, MD  ALPRAZolam Prudy Feeler) 0.5 MG tablet Take 1 tablet (0.5 mg total) by mouth daily as needed for anxiety. 12/24/21   Dugal, Wyatt Mage, FNP  Galcanezumab-gnlm (EMGALITY) 120 MG/ML SOSY INJECT 120 MG UNDER THE SKIN EVERY 28 DAYS 03/07/21   [provider]  Multiple Vitamins-Minerals (MULTIVITAL PO) Take 30 mLs by mouth daily.    [provider]  promethazine (PHENERGAN) 25 MG tablet Take 1 tablet (25 mg total) by mouth every 6 (six) hours as needed for nausea or vomiting. 01/16/23 02/15/23  Charise Killian, MD    Family History Family History  Problem Relation Age of Onset   Hyperlipidemia Mother    Heart disease Father        died at age 32 had pacemaker and defib   Diabetes Father    COPD Father    Arthritis Maternal Aunt    Social  History Social History   Tobacco Use   Smoking status: Never   Smokeless tobacco: Never  Vaping Use   Vaping status: Never Used  Substance Use Topics   Alcohol use: Yes    Alcohol/week: 1.0 standard drink of alcohol    Types: 1 Glasses of wine per week    Comment: occassionally   Drug use: No   Allergies   Penicillins, Botox [onabotulinumtoxina], Percocet [oxycodone-acetaminophen], Naproxen, Ibuprofen, and Tramadol  Review of Systems Review of Systems Pertinent findings revealed after performing a 14 point review of systems has been noted in the history of present illness.  Physical Exam Vital Signs BP (!) 156/105 (BP Location: Left Arm)   Pulse (!) 134   Temp (!) 103.1 F (39.5 C) (  Oral)   Resp 18   LMP  (LMP Unknown)   SpO2 94%   No data found.  Physical Exam Vitals and nursing note reviewed.  Constitutional:      General: She is awake. She is not in acute distress.    Appearance: Normal appearance. She is well-developed and well-groomed. She is ill-appearing.  HENT:     Head: Normocephalic and atraumatic.     Salivary Glands: Right salivary gland is not diffusely enlarged or tender. Left salivary gland is not diffusely enlarged or tender.     Right Ear: Hearing, ear canal and external ear normal. No drainage. A middle ear effusion is present. There is no impacted cerumen. Tympanic membrane is injected, erythematous and bulging.     Left Ear: Hearing, ear canal and external ear normal. No drainage. A middle ear effusion is present. There is no impacted cerumen. Tympanic membrane is injected, erythematous and bulging.     Nose: Nose normal. No nasal deformity, septal deviation, mucosal edema, congestion or rhinorrhea.     Right Turbinates: Not enlarged, swollen or pale.     Left Turbinates: Not enlarged, swollen or pale.     Right Sinus: No maxillary sinus tenderness or frontal sinus tenderness.     Left Sinus: No maxillary sinus tenderness or frontal sinus  tenderness.     Mouth/Throat:     Lips: Pink. No lesions.     Mouth: Mucous membranes are moist. No oral lesions.     Pharynx: Oropharynx is clear. Uvula midline. No posterior oropharyngeal erythema or uvula swelling.     Tonsils: No tonsillar exudate. 0 on the right. 0 on the left.  Eyes:     General: Lids are normal.        Right eye: No discharge.        Left eye: No discharge.     Extraocular Movements: Extraocular movements intact.     Conjunctiva/sclera: Conjunctivae normal.     Right eye: Right conjunctiva is not injected.     Left eye: Left conjunctiva is not injected.  Neck:     Trachea: Trachea and phonation normal.  Cardiovascular:     Rate and Rhythm: Normal rate and regular rhythm.     Pulses: Normal pulses.     Heart sounds: Normal heart sounds. No murmur heard.    No friction rub. No gallop.  Pulmonary:     Effort: Pulmonary effort is normal. No accessory muscle usage, prolonged expiration or respiratory distress.     Breath sounds: Normal breath sounds. No stridor, decreased air movement or transmitted upper airway sounds. No decreased breath sounds, wheezing, rhonchi or rales.  Chest:     Chest wall: No tenderness.  Musculoskeletal:        General: Normal range of motion.     Cervical back: Normal range of motion and neck supple. Normal range of motion.  Lymphadenopathy:     Cervical: No cervical adenopathy.  Skin:    General: Skin is warm and dry.     Findings: No erythema or rash.  Neurological:     General: No focal deficit present.     Mental Status: She is alert and oriented to person, place, and time.  Psychiatric:        Mood and Affect: Mood normal.        Behavior: Behavior normal. Behavior is cooperative.     Visual Acuity Right Eye Distance:   Left Eye Distance:   Bilateral Distance:  Right Eye Near:   Left Eye Near:    Bilateral Near:     UC Couse / Diagnostics / Procedures:     Radiology No results  found.  Procedures Procedures (including critical care time) EKG  Pending results:  Labs Reviewed - No data to display  Medications Ordered in UC: Medications - No data to display  UC Diagnoses / Final Clinical Impressions(s)   I have reviewed the triage vital signs and the nursing notes.  Pertinent labs & imaging results that were available during my care of the patient were reviewed by me and considered in my medical decision making (see chart for details).    Final diagnoses:  Acute suppurative otitis media of both ears without spontaneous rupture of tympanic membranes, recurrence not specified  Acute febrile illness  Viral respiratory infection   Patient provided with a 10-day course of cefdinir for empiric treatment of presumed bacterial otitis media.  Patient advised that I will not be able to prescribe her muscle relaxer or narcotics given her current active prescriptions for both in addition to her active prescriptions for Xanax and gabapentin.  Recommend Robitussin for cough.  Conservative care recommended.  Return precautions advised.  Please see discharge instructions below for details of plan of care as provided to patient. ED Prescriptions     Medication Sig Dispense Auth. Provider   cefdinir (OMNICEF) 300 MG capsule Take 1 capsule (300 mg total) by mouth 2 (two) times daily for 10 days. 20 capsule Theadora Rama Scales, PA-C   acetaminophen (TYLENOL) 500 MG tablet Take 2 tablets (1,000 mg total) by mouth every 8 (eight) hours as needed for up to 30 doses for mild pain (pain score 1-3) or fever. 60 tablet Theadora Rama Scales, PA-C      I have reviewed the PDMP during this encounter.  Pending results:  Labs Reviewed - No data to display    Discharge Instructions      Please read below to learn more about the medications, dosages and frequencies that I recommend to help alleviate your symptoms and to get you feeling better soon:   Omnicef (cefdinir):   Please take one (1) dose twice daily for 10 days.  This antibiotic can cause upset stomach, this will resolve once antibiotics are complete.  You are welcome to take a probiotic, eat yogurt, take Imodium while taking this medication.  Please avoid other systemic medications such as Maalox, Pepto-Bismol or milk of magnesia as they can interfere with the body's ability to absorb the antibiotics.  Robitussin, Mucinex (guaifenesin): This is an expectorant.  This single symptom reliever helps break up chest congestion and loosen up thick nasal drainage making phlegm and drainage easier to cough up and to blow out from your nose.  I recommend taking 400 mg in either liquid or tablet form three times daily as needed.  I do not recommend the 12-hour extended relief version or doses higher than 400 mg per each dose as these often make some patients feel jittery or jumpy and can interfere with sleep.  I also do not recommend that you purchase guaifenesin with the ingredient " DM" which is dextromethorphan, a cough suppressant and that you plan on taking it at bedtime.  Guaifenesin 400 mg is a safe dose for people who are being treated for high blood pressure.  This medication is available over-the-counter.  Because you have active prescriptions for gabapentin, Phenergan, Xanax, hydrocodone, and zolpidem, we cannot safely prescribe you a muscle relaxer at  this time.  While you are welcome to take any of those medications to help you sleep, I strongly discourage you from you taking Xanax or hydrocodone at this time because both will reduce your respiratory rate as well as your depth of breathing and can set you up for pneumonia.   If symptoms have not meaningfully improved in the next 5 to 7 days, please return for repeat evaluation or follow-up with your regular provider.  If symptoms have worsened in the next 3 to 5 days, please return for repeat evaluation or follow-up with your regular provider.    Thank you for  visiting urgent care today.  We appreciate the opportunity to participate in your care.     Disposition Upon Discharge:  Condition: stable for discharge home  Patient presented with an acute illness with associated systemic symptoms and significant discomfort requiring urgent management. In my opinion, this is a condition that a prudent lay person (someone who possesses an average knowledge of health and medicine) may potentially expect to result in complications if not addressed urgently such as respiratory distress, impairment of bodily function or dysfunction of bodily organs.   Routine symptom specific, illness specific and/or disease specific instructions were discussed with the patient and/or caregiver at length.   As such, the patient has been evaluated and assessed, work-up was performed and treatment was provided in alignment with urgent care protocols and evidence based medicine.  Patient/parent/caregiver has been advised that the patient may require follow up for further testing and treatment if the symptoms continue in spite of treatment, as clinically indicated and appropriate.  Patient/parent/caregiver has been advised to return to the [facility] or PCP if no better; to PCP or the Emergency Department if new signs and symptoms develop, or if the current signs or symptoms continue to change or worsen for further workup, evaluation and treatment as clinically indicated and appropriate  The patient will follow up with their current PCP if and as advised. If the patient does not currently have a PCP we will assist them in obtaining one.   The patient may need specialty follow up if the symptoms continue, in spite of conservative treatment and management, for further workup, evaluation, consultation and treatment as clinically indicated and appropriate.  Patient/parent/caregiver verbalized understanding and agreement of plan as discussed.  All questions were addressed during visit.  Please see  discharge instructions below for further details of plan.  This office note has been dictated using Teaching laboratory technician.  Unfortunately, this method of dictation can sometimes lead to typographical or grammatical errors.  I apologize for your inconvenience in advance if this occurs.  Please do not hesitate to reach out to me if clarification is needed.      Theadora Rama Scales, PA-C 02/13/23 1128

## 2023-02-13 NOTE — ED Triage Notes (Signed)
Nausea, dizziness, body aches, temperature 101, weakness, cough x 5 days. Taking Theraflu and benadryl, tylenol with no relief of symptoms.

## 2023-02-21 ENCOUNTER — Ambulatory Visit
Admission: RE | Admit: 2023-02-21 | Discharge: 2023-02-21 | Disposition: A | Discharge status: Home / Self Care | Payer: BC Managed Care – PPO | Attending: Family Medicine | Admitting: Family Medicine

## 2023-02-21 ENCOUNTER — Other Ambulatory Visit: Payer: Self-pay | Admitting: Family Medicine

## 2023-02-21 ENCOUNTER — Ambulatory Visit
Admission: RE | Admit: 2023-02-21 | Discharge: 2023-02-21 | Disposition: A | Discharge status: Home / Self Care | Payer: BC Managed Care – PPO | Source: Ambulatory Visit | Attending: Family Medicine | Admitting: Family Medicine

## 2023-02-21 DIAGNOSIS — R059 Cough, unspecified: Secondary | ICD-10-CM

## 2023-04-30 ENCOUNTER — Ambulatory Visit (INDEPENDENT_AMBULATORY_CARE_PROVIDER_SITE_OTHER): Payer: PRIVATE HEALTH INSURANCE | Admitting: Neurology

## 2023-04-30 ENCOUNTER — Encounter: Payer: Self-pay | Admitting: Neurology

## 2023-04-30 VITALS — BP 162/105 | HR 89 | Ht 65.5 in | Wt 168.2 lb

## 2023-04-30 DIAGNOSIS — G43009 Migraine without aura, not intractable, without status migrainosus: Secondary | ICD-10-CM

## 2023-04-30 DIAGNOSIS — G43711 Chronic migraine without aura, intractable, with status migrainosus: Secondary | ICD-10-CM | POA: Diagnosis not present

## 2023-04-30 MED ORDER — ZAVZPRET 10 MG/ACT NA SOLN: 1.0000 | Freq: Every day | NASAL | Status: DC | PRN

## 2023-04-30 MED ORDER — KETOROLAC TROMETHAMINE 60 MG/2ML IM SOLN
60.0000 mg | Freq: Once | INTRAMUSCULAR | Status: AC
  Administered 2023-04-30: 60 mg via INTRAMUSCULAR

## 2023-04-30 MED ORDER — PROMETHAZINE HCL 25 MG RE SUPP: 25.0000 mg | Freq: Four times a day (QID) | RECTAL | 0 refills | Status: AC | PRN

## 2023-04-30 MED ORDER — EMGALITY 120 MG/ML ~~LOC~~ SOAJ: 120.0000 mg | SUBCUTANEOUS | 11 refills | Status: DC

## 2023-04-30 MED ORDER — KETOROLAC TROMETHAMINE 60 MG/2ML IM SOLN: INTRAMUSCULAR | 4 refills | Status: AC

## 2023-04-30 MED ORDER — BD SAFETYGLIDE SYRINGE/NEEDLE 25G X 1" 3 ML MISC: 5 refills | Status: AC

## 2023-04-30 NOTE — Progress Notes (Signed)
 Per v.o. Dr Lucia Gaskins, administered Toradol 60 mg IM x 1 in RUOQ of R  buttock. Maintained aseptic technique. Patient tolerated well.  Also provided education both verbally and on paper to patient/SO for administration at home. They verbalized understanding and their questions were answered.

## 2023-04-30 NOTE — Patient Instructions (Addendum)
 At onset of migraine; Can try Zavzpret. Also have ubrelvy. Then if needed toradol and phenergan Continue the emgality for prevention  Meds ordered this encounter  Medications   ketorolac (TORADOL) 60 MG/2ML SOLN injection    Sig: Inject 1-46ml (30-60mg ) intramuscularly at onset of migraine. May repeat in 6 hours. Max twice a day and 4 days per month.    Dispense:  10 mL    Refill:  4   SYRINGE-NEEDLE, DISP, 3 ML (BD SAFETYGLIDE SYRINGE/NEEDLE) 25G X 1" 3 ML MISC    Sig: Attach needle to syringe and use to draw up and administer Toradol. Do not reuse.    Dispense:  4 each    Refill:  5   Zavegepant HCl (ZAVZPRET) 10 MG/ACT SOLN    Sig: Place 1 spray into the nose daily as needed.   promethazine (PHENERGAN) 25 MG suppository    Sig: Place 1 suppository (25 mg total) rectally every 6 (six) hours as needed for nausea or vomiting.    Dispense:  12 each    Refill:  0   Galcanezumab-gnlm (EMGALITY) 120 MG/ML SOAJ    Sig: Inject 120 mg into the skin every 30 (thirty) days. Please use copay card: BIN [id number] PCN PDMI GRP [id number] ID [id number] EXP 02/22/2024. This is a 90-day prescription however if insurance does not approve that please dispense monthly.    Dispense:  3 mL    Refill:  11    Please use copay card: BIN [id number] PCN PDMI GRP [id number] ID [id number] EXP 02/22/2024     Zavegepant Nasal Spray What is this medication? ZAVEGEPANT (za VE je pant) treats migraines. It works by blocking a substance in the body that causes migraines. It is not used to prevent migraines. This medicine may be used for other purposes; ask your health care provider or pharmacist if you have questions. COMMON BRAND NAME(S): ZAVZPRET What should I tell my care team before I take this medication? They need to know if you have any of these conditions: Kidney disease Liver disease An unusual or allergic reaction to zavegepant, other medications, foods, dyes, or preservatives Pregnant or trying to get  pregnant Breast-feeding How should I use this medication? This medication is for use in the nose. Take it as directed on the prescription label. Do not use it more often than directed. Make sure that you are using your nasal spray correctly. Ask your care team if you have any questions. Talk to your care team about the use of this medication in children. Special care may be needed. Overdosage: If you think you have taken too much of this medicine contact a poison control center or emergency room at once. NOTE: This medicine is only for you. Do not share this medicine with others. What if I miss a dose? This does not apply. This medication is not for regular use. It should only be used as needed. What may interact with this medication? Decongestant nasal sprays This medication may affect how other medications work, and other medications may affect the way this medication works. Talk with your care team about all of the medications you take. They may suggest changes to your treatment plan to lower the risk of side effects and to make sure your medications work as intended. This list may not describe all possible interactions. Give your health care provider a list of all the medicines, herbs, non-prescription drugs, or dietary supplements you use. Also tell them if you smoke, drink alcohol, or use illegal  drugs. Some items may interact with your medicine. What should I watch for while using this medication? Visit your care team for regular checks on your progress. Tell your care team if your symptoms do not start to get better or if they get worse. What side effects may I notice from receiving this medication? Side effects that you should report to your care team as soon as possible: Allergic reactions--skin rash, itching, hives, swelling of the face, lips, tongue, or throat Side effects that usually do not require medical attention (report to your care team if they continue or are  bothersome): Change in taste Dryness or irritation inside the nose Nausea Vomiting This list may not describe all possible side effects. Call your doctor for medical advice about side effects. You may report side effects to FDA at 1-800-FDA-1088. Where should I keep my medication? Keep out of the reach of children and pets. Store at room temperature between 20 and 25 degrees C (68 and 77 degrees F). Do not freeze. Get rid of any unused medication after the expiration date. To get rid of medications that are no longer needed or have expired: Take the medication to a medication take-back program. Check with your pharmacy or law enforcement to find a location. If you cannot return the medication, ask your pharmacist or care team how to get rid of this medication safely. NOTE: This sheet is a summary. It may not cover all possible information. If you have questions about this medicine, talk to your doctor, pharmacist, or health care provider.  2024 Elsevier/Gold Standard (2021-05-17 00:00:00)

## 2023-04-30 NOTE — Progress Notes (Signed)
 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   Prior to emgality she was having she was having daily headaches and > 20 migraine days a month. She has had exceptional results. She has breakthrough migraines still and uses ubrelvy but she needs something for acute horrible headaches. She has been on Emgality for years since 2019.  She has 4 migraine days a month but when they hit she does not have a good rescue medication. Never had a problem with ketorolac and always has to go to the ED or pcp for a ketorolac injection. And that usually stops the headaches. Prior to emgaity has daily headaches and 20 migraine days a month that would last 24 hours and be severe . Now has 4 migraine days a month and < 10 total headache days a month. Stress and sleep issues makes migraines worse. She has failed multiple medications, reaction to botox, doing well on emgality just needs acute management.  Patient's migraines are pulsating pounding throbbing, can be behind the eyes unilaterally or both sides, photo, phono, osmophobia, nausea, hurts to move, no aura, no medication overuse and no autonomic symptoms but they arer moderate to severe when they occur. Patient complains of symptoms per HPI as well as the following symptoms: none . Pertinent negatives and positives per HPI. All others negative   Medications tried that can be used in migraine management greater than 3 months includes: Tylenol, amitriptyline, Emgality, Botox, Ubrelvy, baclofen, Fioricet, Flexeril, gabapentin, losartan, Zofran, Compazine, Phenergan, propranolol, Maxalt, Imitrex, Topamax, tizanidine, Effexor, sumatriptan, zonisamide. Botox x 1 had severe reaction skin, nortriptyline, nurtec made her sick., aimovig  contraindicated due to constipation, Eletriptan made her sick.   Reviewed notes, labs and imaging from outside physicians, which showed:   11/30/2024L MRI brain: CLINICAL INDICATION: 48 years old Female with increased frequency of migraines following head/neck injury  - G43.019 - Intractable migraine without aura and without status migrainosus    COMPARISON: MRI of the brain 12/21/2016  TECHNIQUE: Multiplanar, multisequence MR imaging of the brain was performed without and with I.V. contrast.  FINDINGS:   There is no focal parenchymal signal abnormality. Ventricles are normal in size. There is no midline shift. No extra-axial fluid collection. No evidence of intracranial hemorrhage. No diffusion weighted signal abnormality to suggest acute infarct.  No mass. There is no abnormal enhancement. Procedure Note  Howard Pouch, MD - 02/02/2023 Formatting of this note might be different from the original. EXAM: Magnetic resonance imaging, brain, without and with contrast material. ACCESSION: [id number] UN  CLINICAL INDICATION: 48 years old Female with increased frequency of migraines following head/neck injury  - G43.019 - Intractable migraine without aura and without status migrainosus    COMPARISON: MRI of the brain 12/21/2016  TECHNIQUE: Multiplanar, multisequence MR imaging of the brain was performed without and with I.V. contrast.  FINDINGS:   There is no focal parenchymal signal abnormality. Ventricles are normal in size. There is no midline shift. No extra-axial fluid collection. No evidence of intracranial hemorrhage. No diffusion weighted signal abnormality to suggest acute infarct.  No mass. There is no abnormal enhancement.  IMPRESSION: Unremarkable MRI of the brain.  CT head 07/25/2022: COMPARISON:  Brain MRI 02/20/2017.  Sinuses   FINDINGS: Brain: Scaphocephaly redemonstrated, normal variant. Normal cerebral volume.  No midline shift, ventriculomegaly, mass  effect, evidence of mass lesion, intracranial hemorrhage or evidence of cortically based acute infarction. Gray-white matter differentiation is within normal limits throughout the brain.   Vascular: No suspicious intracranial vascular hyperdensity.   Skull: No fracture identified.   Sinuses/Orbits: Visualized paranasal sinuses and mastoids are clear.   Other: No orbit or scalp soft tissue injury identified.   IMPRESSION: 1. No acute traumatic injury identified. 2.  Normal noncontrast Head CT.      Latest Ref Rng & Units 01/13/2023    3:28 AM 01/12/2023   10:00 PM 01/02/2023    1:39 PM  CBC  WBC 4.0 - 10.5 K/uL 9.1  9.3  8.0   Hemoglobin 12.0 - 15.0 g/dL 01.0  27.2  53.6   Hematocrit 36.0 - 46.0 % 36.8  37.3  37.6   Platelets 150 - 400 K/uL 325  336  381       Latest Ref Rng & Units 01/14/2023    8:30 AM 01/13/2023    3:28 AM 01/12/2023   10:00 PM  CMP  Glucose 70 - 99 mg/dL 85  96  81   BUN 6 - 20 mg/dL 5  12  15    Creatinine 0.44 - 1.00 mg/dL 6.44  0.34  7.42   Sodium 135 - 145 mmol/L 139  137  139   Potassium 3.5 - 5.1 mmol/L 3.7  3.7  3.7   Chloride 98 - 111 mmol/L 106  104  106   CO2 22 - 32 mmol/L 26  26  23    Calcium 8.9 - 10.3 mg/dL 8.6  8.1  8.6   Total Protein 6.5 - 8.1 g/dL  6.3  6.8   Total Bilirubin <1.2 mg/dL  0.6  0.6   Alkaline Phos 38 - 126 U/L  46  50   AST 15 - 41 U/L  15  17   ALT 0 - 44 U/L  13  13     Review of Systems: Patient complains of symptoms per HPI as well as the following symptoms none. Pertinent negatives and positives per HPI. All others negative.   Social History   Socioeconomic History   Marital status: Married    Spouse name: Not on file   Number of children: 1   Years of education: Not on file   Highest education level: Not on file  Occupational History    Comment: [employer]  Tobacco Use   Smoking status: Never   Smokeless tobacco: Never  Vaping Use   Vaping status: Never Used  Substance and  Sexual Activity   Alcohol use: Yes    Alcohol/week: 1.0 standard drink of alcohol    Types: 1 Glasses of wine per week    Comment: occassionally   Drug use: No   Sexual activity: Yes    Partners: Male    Birth control/protection: Pill, OCP  Other Topics Concern   Not on file  Social History Narrative   Daughter age 52 tillaya   Caffiene 1 cup once daily in am,    Herbal tea (caff free)   Married, no pets   Not working (rest season).   Social Drivers of Corporate investment banker Strain: Not on file  Food Insecurity: No Food Insecurity (01/13/2023)   Hunger Vital Sign    Worried About Running Out of Food in the Last Year: Never true    Ran Out of Food in the Last Year: Never true  Transportation Needs: No Transportation Needs (01/13/2023)   PRAPARE - Administrator, Civil Service (Medical): No    Lack of Transportation (Non-Medical): No  Physical Activity: Not on file  Stress: Not on file  Social Connections: Not on file  Intimate Partner Violence: Not At Risk (01/13/2023)   Humiliation, Afraid, Rape, and Kick questionnaire    Fear of Current or Ex-Partner: No    Emotionally Abused: No    Physically Abused: No    Sexually Abused: No    Family History  Problem Relation Age of Onset   Hyperlipidemia Mother    Heart disease Father        died at age 57 had pacemaker and defib   Diabetes Father    COPD Father    Arthritis Maternal Aunt    Migraines Neg Hx     Past Medical History:  Diagnosis Date   Acute recurrent maxillary sinusitis 03/01/2015   AD (atopic dermatitis) 06/23/2013   Anxiety    Arthralgia of temporomandibular joint 12/19/2013   B12 deficiency 06/20/2016   Bilateral occipital neuralgia 03/21/2017   Chicken pox    Migraine headache    Ovarian cyst 10/2014   right   Overweight 11/22/2018   TMJ click 05/12/2012    Patient Active Problem List   Diagnosis Date Noted   Intractable pain 01/13/2023   AKI (acute kidney injury) (HCC)  01/13/2023   Intractable Abdominal pain 01/13/2023   S/P screening colonoscopy 12/22/2022 01/13/2023   Hypotension 01/13/2023   Elevated hematocrit 12/02/2021   Diabetes mellitus without complication (HCC) 08/14/2021   Microalbuminuria 08/14/2021   Mixed hyperlipidemia 08/14/2021   Endometriosis 08/13/2021   Chronic migraine without aura, with intractable migraine, so stated, with status migrainosus 08/13/2021   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   GAD (generalized anxiety disorder) 08/13/2021   Obesity (BMI 30-39.9) 08/13/2021   Primary hypertension 08/13/2021   Allergy to food 08/13/2021   Screening for colon cancer 08/13/2021   Vitamin D deficiency 08/13/2021   Anemia due to vitamin B12 deficiency 08/13/2021   Chronic daily headache/chronic migraine 02/05/2017   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  Has to drop her daughter off to school.   LAPAROSCOPIC OVARIAN CYSTECTOMY Right 12/18/2017   Procedure: Diagnostic LAPAROSCOPY/Excision of OVARIAN Mass;  Surgeon: Maxie Better, MD;  Location: [facility];  Service: Gynecology;  Laterality: Right;   TONSILLECTOMY  1980    Current Outpatient Medications  Medication Sig Dispense Refill   acetaminophen (TYLENOL) 500 MG tablet Take 2 tablets (1,000 mg total) by mouth every 8 (eight) hours as needed for up to 30 doses for mild pain (pain score 1-3) or fever. 60 tablet 0   gabapentin (NEURONTIN) 300 MG capsule Take two tablets qhs 90 capsule 3   Galcanezumab-gnlm (EMGALITY) 120 MG/ML SOAJ Inject 120 mg into the skin every 30 (thirty) days. Please use copay card: BIN [id number] PCN PDMI GRP  [id number] ID [id number] EXP 02/22/2024. This is a 90-day prescription however if insurance does not approve that please dispense monthly. 3 mL 11   ketorolac (TORADOL) 60 MG/2ML SOLN injection Inject 1-79ml (30-60mg ) intramuscularly at onset of migraine. May repeat in  6 hours. Max twice a day and 4 days per month. 10 mL 4   losartan (COZAAR) 50 MG tablet Take 50 mg by mouth daily.     metFORMIN (GLUCOPHAGE) 500 MG tablet TAKE 1 TABLET (500 MG TOTAL) BY MOUTH DAILY. 90 tablet 1   Multiple Vitamins-Minerals (MULTIVITAL PO) Take 30 mLs by mouth daily.     norethindrone (AYGESTIN) 5 MG tablet Take 1 tablet (5 mg total) by mouth daily.     promethazine (PHENERGAN) 25 MG suppository Place 1 suppository (25 mg total) rectally every 6 (six) hours as needed for nausea or vomiting. 12 each 0   propranolol (INDERAL) 20 MG tablet Take 20-40 mg by mouth 2 (two) times daily. Monitor blood pressure and take as needed 20-40 mg up to twice a day.     Semaglutide,0.25 or 0.5MG /DOS, 2 MG/3ML SOPN Inject 0.5 mg [place] weekly 9 mL 1   SYRINGE-NEEDLE, DISP, 3 ML (BD SAFETYGLIDE SYRINGE/NEEDLE) 25G X 1" 3 ML MISC Attach needle to syringe and use to draw up and administer Toradol. Do not reuse. 4 each 5   tiZANidine (ZANAFLEX) 4 MG tablet Take 1 tablet (4 mg total) by mouth every 8 (eight) hours as needed for muscle spasms. 30 tablet 0   Ubrogepant 100 MG TABS Take 100 mg by mouth as directed. Take 1 tablet (100 mg total) by mouth daily as needed. Take 1 tablet by mouth at ONSET of migraine or aura. May repeat a second dose in 2 HOURS if needed. DO NOT TAKE MORE than 2 doses a day, 2 days a week, or 8 days a month.     Zavegepant HCl (ZAVZPRET) 10 MG/ACT SOLN Place 1 spray into the nose daily as needed.     No current facility-administered medications for this visit.    Allergies as of 04/30/2023 - Review Complete 04/30/2023  Allergen Reaction Noted   Penicillins Anaphylaxis and Itching    Botox [onabotulinumtoxina] Other  (See Comments) 01/13/2023   Percocet [oxycodone-acetaminophen] Nausea And Vomiting 12/18/2017   Naproxen Swelling 05/29/2015   Ibuprofen Rash 05/29/2015   Tramadol Rash 01/11/2016    Vitals: BP (!) 162/105 (BP Location: Right Arm, Patient Position: Sitting, Cuff Size: Normal)   Pulse 89   Ht 5' 5.5" (1.664 m)   Wt 168 lb 3.2 oz (76.3 kg)   BMI 27.56 kg/m  Last Weight:  Wt Readings from Last 1 Encounters:  04/30/23 168 lb 3.2 oz (76.3 kg)   Last Height:   Ht Readings from Last 1 Encounters:  04/30/23 5' 5.5" (1.664 m)     Physical exam: Exam: Gen: NAD, conversant, well nourised, well groomed                     CV: RRR, no MRG. No Carotid Bruits. No peripheral edema, warm, nontender Eyes: Conjunctivae clear without exudates or hemorrhage  Neuro: Detailed Neurologic Exam  Speech:    Speech is normal; fluent and spontaneous with normal comprehension.  Cognition:    The patient is oriented to person, place, and time;     recent and remote memory intact;     language fluent;     normal attention, concentration,     fund of knowledge Cranial Nerves:    The pupils are equal, round, and reactive to light. The fundi are normal and spontaneous venous pulsations are present. Visual fields are full to finger confrontation. Extraocular movements are intact. Trigeminal sensation is intact and the muscles of mastication are  normal. The face is symmetric. The palate elevates in the midline. Hearing intact. Voice is normal. Shoulder shrug is normal. The tongue has normal motion without fasciculations.   Coordination:    Normal finger to nose and heel to shin. Normal rapid alternating movements.   Gait: nml  Motor Observation: nml Tone:    Normal muscle tone.    Posture:    Posture is normal. normal erect    Strength:    Strength is V/V in the upper and lower limbs.      Sensation: intact to LT     Reflex Exam:  DTR's:    Deep tendon reflexes in the upper and lower  extremities are normal bilaterally.   Toes:    The toes are equiv bilaterally.   Clonus:    Clonus is absent.    Assessment/Plan: Patient with chronic migraines fortunately doing very well on Emgality.  Prior to being on Emgality she was having daily headaches and more than 20 total migraine days a month.  Since being on Emgality she has 4 moderate to severe migraine days a month and less than 10 total headache days a month.  Her problem is that when she has a severe migraine and her acute medications do not work and she ends up going to her primary care office or the ED to get a Toradol injection.  To keep her from going to the emergency room her or her PCPs office we will give her Toradol injections to do at home sparingly, asked her to watch her kidney function, we will also give her Phenergan she says that works the best.  Also gave her samples of Zavzpret to see if nasal CGRP would help acutely.  Refilled her Emgality.  She will let us know when she needs her Bernita Raisin refilled.  No orders of the defined types were placed in this encounter.  Meds ordered this encounter  Medications   ketorolac (TORADOL) 60 MG/2ML SOLN injection    Sig: Inject 1-37ml (30-60mg ) intramuscularly at onset of migraine. May repeat in 6 hours. Max twice a day and 4 days per month.    Dispense:  10 mL    Refill:  4   SYRINGE-NEEDLE, DISP, 3 ML (BD SAFETYGLIDE SYRINGE/NEEDLE) 25G X 1" 3 ML MISC    Sig: Attach needle to syringe and use to draw up and administer Toradol. Do not reuse.    Dispense:  4 each    Refill:  5   Zavegepant HCl (ZAVZPRET) 10 MG/ACT SOLN    Sig: Place 1 spray into the nose daily as needed.   promethazine (PHENERGAN) 25 MG suppository    Sig: Place 1 suppository (25 mg total) rectally every 6 (six) hours as needed for nausea or vomiting.    Dispense:  12 each    Refill:  0   Galcanezumab-gnlm (EMGALITY) 120 MG/ML SOAJ    Sig: Inject 120 mg into the skin every 30 (thirty) days. Please use  copay card: BIN [id number] PCN PDMI GRP [id number] ID [id number] EXP 02/22/2024. This is a 90-day prescription however if insurance does not approve that please dispense monthly.    Dispense:  3 mL    Refill:  11    Please use copay card: BIN [id number] PCN PDMI GRP [id number] ID [id number] EXP 02/22/2024   ketorolac (TORADOL) injection 60 mg    Cc: Harvie Bridge, MD,  Wilmon Pali, MD  Naomie Dean, MD  [facility] [address] [place],  [place] [address]  Phone [phone] Fax [phone]

## 2023-05-03 NOTE — Addendum Note (Signed)
 Addended by: Naomie Dean B on: 05/03/2023 07:59 PM   Modules accepted: Level of Service

## 2023-05-05 ENCOUNTER — Telehealth: Payer: Self-pay

## 2023-05-05 ENCOUNTER — Ambulatory Visit: Payer: BC Managed Care – PPO | Admitting: Gastroenterology

## 2023-05-05 NOTE — Telephone Encounter (Signed)
 Called patient to let her know that Dr. Tobi Bastos had reviewed her noted from [facility] and that once she was done with them, to please give [place] a call to schedule an appointment with [place] if needed to. Patient then stated that "yes, I was seen at [facility] and at the moment they were not sure if they needed to proceed with an EUS or ERCP since she had a bad experience with her colonoscopy". So, she was still waiting to hear back from them. Therefore, patient stated that she had called out office to cancel her appointment after that visit at [facility] since she no longer wanted to be seen by Dr. Tobi Bastos due to the horrible experience she had.  Patient also wanted me to notify Dr. Tobi Bastos that she is requesting a correction to be done on her chart. She stated that when she looked into mychart and saw Dr. Johnney Killian notes, she disagreed with his notations stating that they were false and incorrect by stating that she had sepsis and she also didn't appreciate that no stool samples were done to confirm anything. Patient also stated that she would be more than happy to speak to Dr. Tobi Bastos about the situation so once everything had been corrected as the truth, that she wants a confirmation or a courtesy call so she could look at her chart for the corrections.

## 2023-05-05 NOTE — Progress Notes (Deleted)
 [name] MD, MRCP(U.K) [address]  [address]  [place], [place] [address]  Main: [phone]  Fax: [phone]   Primary Care Physician: Wilmon Pali, MD  Primary Gastroenterologist:  Dr. [name]   No chief complaint on file.   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  She had initially presented the day after the procedure to the ER and no abnormalities were seen in the CAT scan except central hepatic dilation.  On repeat CT scan as well no other abnormalities have been found.  Liver function tests are completely normal seen by general surgery. The abdominal discomfort very likely had an episode of infectious possibly viral gastroenteritis or It could have been a change in bowel flora of the colon vulnerable to infections . During the process of evaluation of abdominal pain , imaging showed biliary tract abnormalities and MRCP Shows biliary dilation but no clear obstruction.     She was seen by [facility] on 04/25/2023.  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  We discussed the possibility of repeat MRI as well as ERCP.  We had the MRI performed at our hospital reinterpreted at [facility] and it showed that there was no intrahepatic or extrahepatic biliary duct dilation.  The extrahepatic common duct is minimally dilated to 0.7 cm and possibly a variant anatomy.  2 tiny cyst lesions likely simple hepatic epithelial cysts but could also be related to biliary diverticulum.  Repeat MRI MRCP in 6 months is recommended.  Current Outpatient Medications  Medication Sig Dispense Refill   acetaminophen (TYLENOL) 500 MG tablet Take 2 tablets (1,000 mg total) by mouth every 8 (eight) hours as needed for up to 30 doses for mild pain (pain score 1-3) or fever. 60 tablet 0   gabapentin (NEURONTIN) 300 MG capsule Take two tablets qhs 90 capsule 3    Galcanezumab-gnlm (EMGALITY) 120 MG/ML SOAJ Inject 120 mg into the skin every 30 (thirty) days. Please use copay card: BIN [id number] PCN PDMI GRP [id number] ID [id number] EXP 02/22/2024. This is a 90-day prescription however if insurance does not approve that please dispense monthly. 3 mL 11   ketorolac (TORADOL) 60 MG/2ML SOLN injection Inject 1-92ml (30-60mg ) intramuscularly at onset of migraine. May repeat in 6 hours. Max twice a day and 4 days per month. 10 mL 4   losartan (COZAAR) 50 MG tablet Take 50 mg by mouth daily.     metFORMIN (GLUCOPHAGE) 500 MG tablet TAKE 1 TABLET (500 MG TOTAL) BY MOUTH DAILY. 90 tablet 1   Multiple Vitamins-Minerals (MULTIVITAL PO) Take 30 mLs by mouth daily.     norethindrone (AYGESTIN) 5 MG tablet Take 1 tablet (5 mg total) by mouth daily.     promethazine (PHENERGAN) 25 MG suppository Place 1 suppository (25 mg total) rectally every 6 (six) hours as needed for nausea or vomiting. 12 each 0   propranolol (INDERAL) 20 MG tablet Take 20-40 mg by mouth 2 (two) times daily. Monitor blood pressure and take as needed 20-40 mg up to twice a day.     Semaglutide,0.25 or 0.5MG /DOS, 2 MG/3ML SOPN Inject 0.5 mg [place] weekly 9 mL 1   SYRINGE-NEEDLE, DISP, 3 ML (BD SAFETYGLIDE SYRINGE/NEEDLE) 25G X 1" 3 ML MISC Attach needle to syringe and use to draw up and administer Toradol. Do not reuse. 4 each 5   tiZANidine (  ZANAFLEX) 4 MG tablet Take 1 tablet (4 mg total) by mouth every 8 (eight) hours as needed for muscle spasms. 30 tablet 0   Ubrogepant 100 MG TABS Take 100 mg by mouth as directed. Take 1 tablet (100 mg total) by mouth daily as needed. Take 1 tablet by mouth at ONSET of migraine or aura. May repeat a second dose in 2 HOURS if needed. DO NOT TAKE MORE than 2 doses a day, 2 days a week, or 8 days a month.     Zavegepant HCl (ZAVZPRET) 10 MG/ACT SOLN Place 1 spray into the nose daily as needed.     No current facility-administered medications for this visit.    Allergies as of  05/05/2023 - Review Complete 04/30/2023  Allergen Reaction Noted   Penicillins Anaphylaxis and Itching    Botox [onabotulinumtoxina] Other (See Comments) 01/13/2023   Percocet [oxycodone-acetaminophen] Nausea And Vomiting 12/18/2017   Naproxen Swelling 05/29/2015   Ibuprofen Rash 05/29/2015   Tramadol Rash 01/11/2016       Interval history   ***/***/202*   ***/***/2024   ROS:  General: Negative for anorexia, weight loss, fever, chills, fatigue, weakness. ENT: Negative for hoarseness, difficulty swallowing , nasal congestion. CV: Negative for chest pain, angina, palpitations, dyspnea on exertion, peripheral edema.  Respiratory: Negative for dyspnea at rest, dyspnea on exertion, cough, sputum, wheezing.  GI: See history of present illness. GU:  Negative for dysuria, hematuria, urinary incontinence, urinary frequency, nocturnal urination.  Endo: Negative for unusual weight change.    Physical Examination:   There were no vitals taken for this visit.  General: Well-nourished, well-developed in no acute distress.  Eyes: No icterus. Conjunctivae pink. Mouth: Oropharyngeal mucosa moist and pink , no lesions erythema or exudate. Lungs: Clear to auscultation bilaterally. Non-labored. Heart: Regular rate and rhythm, no murmurs rubs or gallops.  Abdomen: Bowel sounds are normal, nontender, nondistended, no hepatosplenomegaly or masses, no abdominal bruits or hernia , no rebound or guarding.   Extremities: No lower extremity edema. No clubbing or deformities. Neuro: Alert and oriented x 3.  Grossly intact. Skin: Warm and dry, no jaundice.   Psych: Alert and cooperative, normal mood and affect.   Imaging Studies: No results found.  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 During the process of evaluation MRCP showed  intrahepatic biliary dilation with normal LFT's. Concern for CBD dilation 8 mm but no clear obstruction , in addition in the left lobe of the liver there is a solitary subcentimeter cyst or diverticulum involving segmental bile duct or hepatic segment of unclear significance unclear if this is a choledochal cyst further follow-up was performed at [facility] and reinterpretation of the MRI that we performed and no clear abnormalities were seen except mild biliary extrahepatic dilation.  No clear evidence of biliary cysts were noted except possibly benign epithelial cyst they recommended repeating an MRI MRCP with contrast in 6 months after the last 1 which would be in May 2025.   Plan   MRI MRCP with [facility] in May 2025 and follow-up with [facility] subsequently    Dr [name]  MD,MRCP [facility]) Follow up in ***

## 2023-05-13 NOTE — Telephone Encounter (Signed)
 I looked into patient's chart to see if she had read her message via MyChart and had replied back to me. She hd read the message I set her but had not replied back as of yet. I'll wait and see if she replies back to me.

## 2023-05-13 NOTE — Telephone Encounter (Signed)
 Sent patient a message via MyChart so she could review her chart and then cut and paste for Dr. Tobi Bastos to review and make corrections if needed to.

## 2023-09-10 ENCOUNTER — Telehealth: Payer: Self-pay | Admitting: Neurology

## 2023-09-10 DIAGNOSIS — G43711 Chronic migraine without aura, intractable, with status migrainosus: Secondary | ICD-10-CM

## 2023-09-10 MED ORDER — EMGALITY 120 MG/ML ~~LOC~~ SOAJ: 120.0000 mg | SUBCUTANEOUS | 2 refills | Status: DC

## 2023-09-10 NOTE — Telephone Encounter (Signed)
 Received message from phone room stating pt is on phone to let us  know she just found out she needs PA for Emgality  that she is trying to fill.  Needs 3 month supply please. Thank you!

## 2023-09-10 NOTE — Telephone Encounter (Signed)
 [facility]  [facility] and [facility]. I called pt and confirmed she wants Rx to go to [facility] #[id number] at [facility] and [place]. Per patient, I removed the other [facility] and [facility] from her list. Emgality  now sent to [facility] #[id number]. Patient appreciative.

## 2023-09-10 NOTE — Telephone Encounter (Signed)
 Pt called stating that she is needing a refill on her Galcanezumab -gnlm (EMGALITY ) 120 MG/ML SOAJ . Pt would like a 3 month supply so that she does not have to go through the medication being late every month. Please send to the [facility] on [address] In [place].

## 2023-09-10 NOTE — Telephone Encounter (Signed)
 Pt called again stating that her pharmacy is needing a PA for her Emgality . I spoke to the RN and I was informed to let the pt know that she will put it through the PA team as urgent. I informed pt and she was really appreciative and thanked the staff for doing this for her.

## 2023-10-29 ENCOUNTER — Telehealth: Payer: Self-pay | Admitting: Neurology

## 2023-10-29 NOTE — Telephone Encounter (Signed)
 Pt called back , Confirm Appt  Change with Pt , Pt is fine wit Change

## 2023-10-29 NOTE — Telephone Encounter (Signed)
 LVM and sent mychart msg informing pt of appt change due to MD schedule change

## 2023-11-05 ENCOUNTER — Telehealth: Payer: PRIVATE HEALTH INSURANCE | Admitting: Neurology

## 2023-11-06 ENCOUNTER — Telehealth: Payer: Self-pay | Admitting: Neurology

## 2023-11-06 NOTE — Telephone Encounter (Signed)
 Pt called stating that she is needing her Emgality  refill as soon as possible. She would like a 3 month supply sent in to the [facility] in [place].

## 2023-11-07 ENCOUNTER — Other Ambulatory Visit (HOSPITAL_COMMUNITY): Payer: Self-pay

## 2023-11-07 ENCOUNTER — Telehealth: Payer: Self-pay

## 2023-11-07 NOTE — Telephone Encounter (Signed)
   Will need to call the plan above to initiate PA-can not be submitted via Latent.

## 2023-11-10 ENCOUNTER — Other Ambulatory Visit: Payer: Self-pay

## 2023-11-10 DIAGNOSIS — G43711 Chronic migraine without aura, intractable, with status migrainosus: Secondary | ICD-10-CM

## 2023-11-10 MED ORDER — EMGALITY 120 MG/ML ~~LOC~~ SOAJ: 120.0000 mg | SUBCUTANEOUS | 2 refills | Status: DC

## 2023-11-16 ENCOUNTER — Other Ambulatory Visit: Payer: Self-pay | Admitting: Neurology

## 2023-11-16 DIAGNOSIS — G43019 Migraine without aura, intractable, without status migrainosus: Secondary | ICD-10-CM

## 2023-11-17 NOTE — Telephone Encounter (Signed)
 last office visit 04/30/23 per patient's last visit return in 6 months - next office visit 12/05/23  Please advise on filling Ubrelvy last filled by [facility] in 06/22/21 per med list

## 2023-11-19 ENCOUNTER — Other Ambulatory Visit (HOSPITAL_COMMUNITY): Payer: Self-pay

## 2023-11-19 NOTE — Telephone Encounter (Signed)
 I called insurance-PT has been getting the syringes filled by previous provider and not the auto injector. However PT just filled a 90ds on 11/08/2023. If PT wishes to switch to utoinjector after this 90 day sill then we try to submit again at that time. No pa needed at this time from the [facility] (PICA PLAN for [employer] employees)  Case ID number 1: [id number]  Approval dates 10/20/2023-11/18/2024  Case ID number 2: [id number]  Approval dates 10/20/2023-12/19/2023

## 2023-11-27 ENCOUNTER — Telehealth: Payer: Self-pay | Admitting: Neurology

## 2023-11-27 NOTE — Telephone Encounter (Signed)
 MYC canc

## 2023-12-01 ENCOUNTER — Encounter (INDEPENDENT_AMBULATORY_CARE_PROVIDER_SITE_OTHER): Payer: Self-pay | Admitting: Neurology

## 2023-12-01 DIAGNOSIS — G43019 Migraine without aura, intractable, without status migrainosus: Secondary | ICD-10-CM | POA: Diagnosis not present

## 2023-12-02 MED ORDER — NORTRIPTYLINE HCL 10 MG PO CAPS: 20.0000 mg | ORAL_CAPSULE | Freq: Every day | ORAL | 11 refills | Status: DC

## 2023-12-02 NOTE — Telephone Encounter (Signed)
Please see the MyChart message reply(ies) for my assessment and plan.    This patient gave consent for this Medical Advice Message and is aware that it may result in a bill to [employer], as well as the possibility of receiving a bill for a co-payment or deductible. They are an established patient, but are not seeking medical advice exclusively about a problem treated during an in person or video visit in the last seven days. I did not recommend an in person or video visit within seven days of my reply.    I spent a total of 7 minutes cumulative time within 7 days through [employer].  Marcial Pacas, MD

## 2023-12-02 NOTE — Telephone Encounter (Signed)
 Noted thanks

## 2023-12-03 ENCOUNTER — Encounter: Payer: Self-pay | Admitting: Neurology

## 2023-12-03 NOTE — Telephone Encounter (Signed)
 Handled in other encounter.

## 2023-12-05 ENCOUNTER — Telehealth: Payer: PRIVATE HEALTH INSURANCE | Admitting: Neurology

## 2024-02-16 ENCOUNTER — Telehealth: Payer: Self-pay | Admitting: Neurology

## 2024-02-16 ENCOUNTER — Ambulatory Visit: Admitting: Neurology

## 2024-02-16 ENCOUNTER — Encounter: Payer: Self-pay | Admitting: Neurology

## 2024-02-16 VITALS — BP 145/98 | HR 94 | Ht 63.5 in | Wt 175.6 lb

## 2024-02-16 DIAGNOSIS — G43711 Chronic migraine without aura, intractable, with status migrainosus: Secondary | ICD-10-CM

## 2024-02-16 DIAGNOSIS — F5104 Psychophysiologic insomnia: Secondary | ICD-10-CM | POA: Diagnosis not present

## 2024-02-16 DIAGNOSIS — M542 Cervicalgia: Secondary | ICD-10-CM

## 2024-02-16 DIAGNOSIS — G43019 Migraine without aura, intractable, without status migrainosus: Secondary | ICD-10-CM

## 2024-02-16 DIAGNOSIS — G8929 Other chronic pain: Secondary | ICD-10-CM

## 2024-02-16 MED ORDER — TRAZODONE HCL 50 MG PO TABS: 100.0000 mg | ORAL_TABLET | Freq: Every day | ORAL | 11 refills | Status: AC

## 2024-02-16 MED ORDER — UBRELVY 100 MG PO TABS: 100.0000 mg | ORAL_TABLET | ORAL | 11 refills | Status: AC | PRN

## 2024-02-16 MED ORDER — TIZANIDINE HCL 4 MG PO TABS: 4.0000 mg | ORAL_TABLET | Freq: Four times a day (QID) | ORAL | 6 refills | Status: AC | PRN

## 2024-02-16 MED ORDER — EMGALITY 120 MG/ML ~~LOC~~ SOAJ: 120.0000 mg | SUBCUTANEOUS | 4 refills | Status: AC

## 2024-02-16 MED ORDER — PROMETHAZINE HCL 25 MG RE SUPP: 25.0000 mg | Freq: Four times a day (QID) | RECTAL | 6 refills | Status: DC | PRN

## 2024-02-16 MED ORDER — PROMETHAZINE HCL 12.5 MG PO TABS: 12.5000 mg | ORAL_TABLET | Freq: Four times a day (QID) | ORAL | 6 refills | Status: AC | PRN

## 2024-02-16 NOTE — Progress Notes (Signed)
 Chief Complaint  Patient presents with   Follow-up    Pt in room 15.  Alone. Here for migraines former Dr.Ahern patient. Pt needs refill Ubrelvy  and Emgailty.      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 Ines in February 2025, primary care physician was Dr. Rebbecca, Priscille,     History is obtained from the patient and review of electronic medical records. I personally reviewed pertinent available imaging films in PACS.   PMHx of  HTN,  Pre-DM  She has long history of chronic migraine started around age 36, typical migraine are lateralized severe retro-orbital area pounding headache with light, noise sensitivity, nauseous, neck muscle tension  Preventive Medication Tried: Amitriptyline  Nortriptyline ,  BOTOX, once  Baclofen  Gabapentin  Effexor   Zonisamide Topamax. Trigger point injection  Abortive Medication: Imitrex  Maxalt   She has been using Emgality  since 2019, helps her migraine well, had 2-3 migraine at the end of injection cycle, if she catch it earlier taking Ubrelvy , works well for her, but if she delayed in taking Ubrelvy  few hours later, she would have a lingering headache lasting 1 to 2 days  She is using Toradol  60 mg intramuscular injection as needed, which does get rid of the headache, but she is worried about the long-term  side effect of Toradol , also does not like the intramuscular injection  MRI of the brain November 2024, no significant abnormality  She also complains of stress, chronic insomnia, has difficulty sleeping 5 days in a week over the past couple years, tried multiple over-the-counter medications, Benadryl, doxylamine without helping her sleep, is taking gabapentin  600 mg at night for abdominal discomfort with limited help   PHYSICAL EXAM:   Vitals:   02/16/24 1459 02/16/24 1506  BP: (!) 165/102 (!) 145/98  Pulse: 94   Weight: 175 lb 9.6 oz (79.7 kg)   Height: 5' 3.5 (1.613 m)    Not recorded     Body mass index is 30.62 kg/m.  PHYSICAL EXAMNIATION:  Gen: NAD, conversant, well nourised, well groomed                     Cardiovascular: Regular rate rhythm, no peripheral edema, warm, nontender. Eyes: Conjunctivae clear without exudates or hemorrhage Neck: Supple, no carotid bruits. Pulmonary: Clear to auscultation bilaterally   NEUROLOGICAL EXAM:  MENTAL STATUS: Speech/cognition: Awake, alert, oriented to history taking and casual conversation CRANIAL NERVES: CN II: Visual fields are full to confrontation. Pupils are round equal and briskly reactive to light.  Funduscopy examination were normal bilaterally CN III, IV, VI: extraocular movement are normal. No ptosis. CN V: Facial sensation is intact to light touch CN VII: Face is symmetric with normal eye closure  CN VIII: Hearing is normal to causal conversation. CN IX, X: Phonation is normal. CN XI: Head turning and shoulder shrug are intact  MOTOR: There is no pronator  drift of out-stretched arms. Muscle bulk and tone are normal. Muscle strength is normal.  REFLEXES: Reflexes are 2+ and symmetric at the biceps, triceps, knees, and ankles. Plantar responses are flexor.  SENSORY: Intact to light touch, pinprick and vibratory sensation are intact in fingers and toes.  COORDINATION: There is no trunk or limb dysmetria  noted.  GAIT/STANCE: Posture is normal. Gait is steady   REVIEW OF SYSTEMS:  Full 14 system review of systems performed and notable only for as above All other review of systems were negative.   ALLERGIES: Allergies[1]  HOME MEDICATIONS: Current Outpatient Medications  Medication Sig Dispense Refill   acetaminophen  (TYLENOL ) 500 MG tablet Take 2 tablets (1,000 mg total) by mouth every 8 (eight) hours as needed for up to 30 doses for mild pain (pain score 1-3) or fever. 60 tablet 0   gabapentin  (NEURONTIN ) 300 MG capsule Take two tablets qhs 90 capsule 3   Galcanezumab -gnlm (EMGALITY ) 120 MG/ML SOAJ Inject 120 mg into the skin every 30 (thirty) days. Please use copay card: BIN [id number] PCN PDMI GRP [id number] ID [id number] EXP 02/22/2024. This is a 90-day prescription however if insurance does not approve that please dispense monthly. 3 mL 2   losartan  (COZAAR ) 50 MG tablet Take 50 mg by mouth daily.     metFORMIN  (GLUCOPHAGE ) 500 MG tablet TAKE 1 TABLET (500 MG TOTAL) BY MOUTH DAILY. 90 tablet 1   Multiple Vitamins-Minerals (MULTIVITAL PO) Take 30 mLs by mouth daily.     norethindrone  (AYGESTIN ) 5 MG tablet Take 1 tablet (5 mg total) by mouth daily.     promethazine  (PHENERGAN ) 25 MG suppository Place 1 suppository (25 mg total) rectally every 6 (six) hours as needed for nausea or vomiting. 12 each 0   propranolol  (INDERAL ) 20 MG tablet Take 20-40 mg by mouth 2 (two) times daily. Monitor blood pressure and take as needed 20-40 mg up to twice a day.     Semaglutide ,0.25 or 0.5MG /DOS, 2 MG/3ML SOPN Inject 0.5 mg [place] weekly 9 mL 1   SYRINGE-NEEDLE, DISP, 3 ML (BD SAFETYGLIDE SYRINGE/NEEDLE) 25G X 1 3 ML MISC Attach needle to syringe and use to draw up and administer Toradol . Do not reuse. 4 each 5   Ubrogepant  (UBRELVY ) 100 MG TABS Take 1 tablet (100 mg total) by mouth as needed. Take 1 (100mg ) tablet onset of Migraines Can repeat in 2 hours don't  exceed 200mg  in a 24 hour period 16 tablet 1    ketorolac  (TORADOL ) 60 MG/2ML SOLN injection Inject 1-2ml (30-60mg ) intramuscularly at onset of migraine. May repeat in 6 hours. Max twice a day and 4 days per month. (Patient not taking: Reported on 02/16/2024) 10 mL 4   nortriptyline  (PAMELOR ) 10 MG capsule Take 2 capsules (20 mg total) by mouth at bedtime. (Patient not taking: Reported on 02/16/2024) 60 capsule 11   Zavegepant HCl (ZAVZPRET ) 10 MG/ACT SOLN Place 1 spray into the nose daily as needed. (Patient not taking: Reported on 02/16/2024)     No current facility-administered medications for this visit.    PAST MEDICAL HISTORY: Past Medical History:  Diagnosis Date   Acute recurrent maxillary sinusitis 03/01/2015   AD (atopic dermatitis) 06/23/2013   Anxiety    Arthralgia of temporomandibular joint 12/19/2013   B12 deficiency 06/20/2016   Bilateral occipital neuralgia 03/21/2017   Chicken pox    Migraine headache    Ovarian cyst 10/2014   right   Overweight 11/22/2018   TMJ click 05/12/2012  PAST SURGICAL HISTORY: Past Surgical History:  Procedure Laterality Date   COLONOSCOPY WITH PROPOFOL  N/A 01/01/2023   Procedure: COLONOSCOPY WITH PROPOFOL ;  Surgeon: Therisa Bi, MD;  Location: [facility] ENDOSCOPY;  Service: Gastroenterology;  Laterality: N/A;  Would prefer arrival time at 10 am.  Has to drop her daughter off to school.   LAPAROSCOPIC OVARIAN CYSTECTOMY Right 12/18/2017   Procedure: Diagnostic LAPAROSCOPY/Excision of OVARIAN Mass;  Surgeon: Rutherford Gain, MD;  Location: [facility] ORS;  Service: Gynecology;  Laterality: Right;   TONSILLECTOMY  1980    FAMILY HISTORY: Family History  Problem Relation Age of Onset   Hyperlipidemia Mother    Heart disease Father        died at age 69 had pacemaker and defib   Diabetes Father    COPD Father    Arthritis Maternal Aunt    Migraines Neg Hx     SOCIAL HISTORY: Social History   Socioeconomic History   Marital status: Married    Spouse name: Not on file   Number of  children: 1   Years of education: Not on file   Highest education level: Not on file  Occupational History    Comment: [employer]  Tobacco Use   Smoking status: Never   Smokeless tobacco: Never  Vaping Use   Vaping status: Never Used  Substance and Sexual Activity   Alcohol use: Yes    Alcohol/week: 1.0 standard drink of alcohol    Types: 1 Glasses of wine per week    Comment: occassionally   Drug use: No   Sexual activity: Yes    Partners: Male    Birth control/protection: Pill, OCP  Other Topics Concern   Not on file  Social History Narrative   Daughter age 46 tillaya   Caffiene 1 cup once daily in am,    Herbal tea (caff free)   Married, no pets   Not working (rest season).   Social Drivers of Health   Tobacco Use: Low Risk (02/16/2024)   Patient History    Smoking Tobacco Use: Never    Smokeless Tobacco Use: Never    Passive Exposure: Not on file  Financial Resource Strain: Not on file  Food Insecurity: No Food Insecurity (01/13/2023)   Hunger Vital Sign    Worried About Running Out of Food in the Last Year: Never true    Ran Out of Food in the Last Year: Never true  Transportation Needs: No Transportation Needs (01/13/2023)   PRAPARE - Administrator, Civil Service (Medical): No    Lack of Transportation (Non-Medical): No  Physical Activity: Not on file  Stress: Not on file  Social Connections: Not on file  Intimate Partner Violence: Not At Risk (01/13/2023)   Humiliation, Afraid, Rape, and Kick questionnaire    Fear of Current or Ex-Partner: No    Emotionally Abused: No    Physically Abused: No    Sexually Abused: No  Depression (PHQ2-9): Low Risk (08/14/2021)   Depression (PHQ2-9)    PHQ-2 Score: 0  Alcohol Screen: Not on file  Housing: Unknown (07/19/2023)   Received from [facility]   Epic    At any time in the past 12 months, were you homeless or living in a shelter (including now)?: No    Unable to  Pay for Housing in the Last Year: Not on file    Number of Times Moved in the Last Year: Not on file  Utilities: Not At Risk (  01/13/2023)   [employer] Utilities    Threatened with loss of utilities: No  Health Literacy: Not on file      Modena Callander, M.D. Ph.D.  [facility] [address] [place], [place] [address] Ph: [phone] Fax: [phone]  CC:  Rebbecca Staggers, MD [address] [place],  [place] [address]  Rebbecca Staggers, MD       [1]  Allergies Allergen Reactions   Penicillins Anaphylaxis and Itching    Facial swelling Has patient had a PCN reaction causing immediate rash, facial/tongue/throat swelling, SOB or lightheadedness with hypotension: Yes Has patient had a PCN reaction causing severe rash involving mucus membranes or skin necrosis: No Has patient had a PCN reaction that required hospitalization: No Has patient had a PCN reaction occurring within the last 10 years: No If all of the above answers are NO, then may proceed with Cephalosporin use.    Botox [Onabotulinumtoxina] Other (See Comments)    Burning, skin sloughing, blood clots   Percocet [Oxycodone -Acetaminophen ] Nausea And Vomiting   Naproxen  Swelling    Swelling in fingers up to arm.   Ibuprofen  Rash   Tramadol  Rash    Headaches

## 2024-02-16 NOTE — Patient Instructions (Addendum)
 Meds ordered this encounter  Medications   traZODone  (DESYREL ) 50 MG tablet-- slow titration for chronic insomnia    Sig: Take 2 tablets (100 mg total) by mouth at bedtime.    Dispense:  60 tablet    Refill:  11   Galcanezumab -gnlm (EMGALITY ) 120 MG/ML SOAJ    Sig: Inject 120 mg into the skin every 30 (thirty) days. Please use copay card: BIN [id number] PCN PDMI GRP [id number] ID [id number] EXP 02/22/2024. This is a 90-day prescription however if insurance does not approve that please dispense monthly.    Dispense:  3 mL    Refill:  4    Please use copay card: BIN [id number] PCN PDMI GRP [id number] ID [id number] EXP 02/22/2024  1 Ubrogepant  (UBRELVY ) 100 MG TABS    Sig: Take 1 tablet (100 mg total) by mouth as needed. Take 1 (100mg ) tablet onset of Migraines Can repeat in 2 hours don't  exceed 200mg  in a 24 hour period    Dispense:  16 tablet    Refill:  11  2 tiZANidine  (ZANAFLEX ) 4 MG tablet    Sig: Take 1 tablet (4 mg total) by mouth every 6 (six) hours as needed.    Dispense:  30 tablet    Refill:  6  3 promethazine  (PHENERGAN ) 12.5    Sig: 12.5mg  every 6 (six) hours as needed for nausea or vomiting.    Dispense:  30 each    Refill:  6      1+2+3+ aleve  as needed for prolonged severe headaches,

## 2024-02-16 NOTE — Telephone Encounter (Signed)
 Patient called to schedule appointment with another neurologist to be able to get refill due to Dr. Ines no longer with [facility].

## 2024-02-22 ENCOUNTER — Other Ambulatory Visit: Payer: Self-pay

## 2024-02-22 ENCOUNTER — Emergency Department

## 2024-02-22 ENCOUNTER — Emergency Department
Admission: EM | Admit: 2024-02-22 | Discharge: 2024-02-22 | Disposition: A | Discharge status: Home / Self Care | Attending: Emergency Medicine | Admitting: Emergency Medicine

## 2024-02-22 DIAGNOSIS — E119 Type 2 diabetes mellitus without complications: Secondary | ICD-10-CM | POA: Insufficient documentation

## 2024-02-22 DIAGNOSIS — R059 Cough, unspecified: Secondary | ICD-10-CM | POA: Diagnosis present

## 2024-02-22 DIAGNOSIS — J101 Influenza due to other identified influenza virus with other respiratory manifestations: Secondary | ICD-10-CM | POA: Insufficient documentation

## 2024-02-22 LAB — CBC WITH DIFFERENTIAL/PLATELET
Abs Immature Granulocytes: 0.04 K/uL (ref 0.00–0.07)
Basophils Absolute: 0.1 K/uL (ref 0.0–0.1)
Basophils Relative: 1 %
Eosinophils Absolute: 0.1 K/uL (ref 0.0–0.5)
Eosinophils Relative: 1 %
HCT: 37 % (ref 36.0–46.0)
Hemoglobin: 12.2 g/dL (ref 12.0–15.0)
Immature Granulocytes: 0 %
Lymphocytes Relative: 15 %
Lymphs Abs: 1.5 K/uL (ref 0.7–4.0)
MCH: 30.1 pg (ref 26.0–34.0)
MCHC: 33 g/dL (ref 30.0–36.0)
MCV: 91.4 fL (ref 80.0–100.0)
Monocytes Absolute: 0.5 K/uL (ref 0.1–1.0)
Monocytes Relative: 5 %
Neutro Abs: 7.8 K/uL — ABNORMAL HIGH (ref 1.7–7.7)
Neutrophils Relative %: 78 %
Platelets: 390 K/uL (ref 150–400)
RBC: 4.05 MIL/uL (ref 3.87–5.11)
RDW: 13.7 % (ref 11.5–15.5)
WBC: 10.1 K/uL (ref 4.0–10.5)
nRBC: 0 % (ref 0.0–0.2)

## 2024-02-22 LAB — BASIC METABOLIC PANEL WITH GFR
Anion gap: 11 (ref 5–15)
BUN: 15 mg/dL (ref 6–20)
CO2: 23 mmol/L (ref 22–32)
Calcium: 8.6 mg/dL — ABNORMAL LOW (ref 8.9–10.3)
Chloride: 105 mmol/L (ref 98–111)
Creatinine, Ser: 1.43 mg/dL — ABNORMAL HIGH (ref 0.44–1.00)
GFR, Estimated: 44 mL/min — ABNORMAL LOW
Glucose, Bld: 130 mg/dL — ABNORMAL HIGH (ref 70–99)
Potassium: 4.2 mmol/L (ref 3.5–5.1)
Sodium: 139 mmol/L (ref 135–145)

## 2024-02-22 LAB — RESP PANEL BY RT-PCR (RSV, FLU A&B, COVID)  RVPGX2
Influenza A by PCR: POSITIVE — AB
Influenza B by PCR: NEGATIVE
Resp Syncytial Virus by PCR: NEGATIVE
SARS Coronavirus 2 by RT PCR: NEGATIVE

## 2024-02-22 LAB — GROUP A STREP BY PCR: Group A Strep by PCR: NOT DETECTED

## 2024-02-22 LAB — CK: Total CK: 78 U/L (ref 38–234)

## 2024-02-22 MED ORDER — OSELTAMIVIR PHOSPHATE 30 MG PO CAPS: 30.0000 mg | ORAL_CAPSULE | Freq: Two times a day (BID) | ORAL | 0 refills | Status: AC

## 2024-02-22 MED ORDER — OSELTAMIVIR PHOSPHATE 30 MG PO CAPS
30.0000 mg | ORAL_CAPSULE | Freq: Once | ORAL | Status: AC
  Administered 2024-02-22: 30 mg via ORAL
  Filled 2024-02-22: qty 1

## 2024-02-22 MED ORDER — ACETAMINOPHEN 500 MG PO TABS
500.0000 mg | ORAL_TABLET | Freq: Once | ORAL | Status: AC
  Administered 2024-02-22: 500 mg via ORAL
  Filled 2024-02-22: qty 1

## 2024-02-22 MED ORDER — SODIUM CHLORIDE 0.9 % IV BOLUS
1000.0000 mL | Freq: Once | INTRAVENOUS | Status: AC
  Administered 2024-02-22: 1000 mL via INTRAVENOUS

## 2024-02-22 NOTE — ED Notes (Signed)
 See triage note  Presents with body aches and cough  States her sx's started yesterday  Afebrile on arrival

## 2024-02-22 NOTE — ED Provider Notes (Signed)
 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  Daughter is sick with similar symptoms.  Patient did not take her temperature prior to arrival, though did take either DayQuil or NyQuil this morning at approximately 5 AM.  No chest pain or shortness of breath.  No nausea or vomiting.  No abdominal pain.  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   GAD (generalized anxiety disorder) 08/13/2021   Obesity (BMI 30-39.9) 08/13/2021   Primary hypertension 08/13/2021   Allergy to food 08/13/2021   Screening for colon cancer 08/13/2021   Vitamin D  deficiency 08/13/2021   Anemia due to vitamin B12 deficiency 08/13/2021   Chronic daily headache/chronic migraine 02/05/2017   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   Allergic rhinitis 06/10/2013   Headache, migraine 05/11/2012   Chronic insomnia 09/25/2010   Chronic neck pain 03/09/2009          Physical Exam   Triage Vital Signs: ED Triage Vitals  Encounter Vitals Group     BP 02/22/24 0718 (!) 133/98     Girls Systolic BP Percentile --      Girls Diastolic BP  Percentile --      Boys Systolic BP Percentile --      Boys Diastolic BP Percentile --      Pulse Rate 02/22/24 0718 84     Resp 02/22/24 0718 20     Temp 02/22/24 0718 98.3 F (36.8 C)     Temp Source 02/22/24 0718 Oral     SpO2 02/22/24 0718 98 %     Weight 02/22/24 0715 172 lb (78 kg)     Height 02/22/24 0715 5' 5 (1.651 m)     Head Circumference --      Peak Flow --      Pain Score 02/22/24 0714 7     Pain Loc --      Pain Education --      Exclude from Growth Chart --     Most recent vital signs: Vitals:   02/22/24 0718  BP: (!) 133/98  Pulse: 84  Resp: 20  Temp: 98.3 F (36.8 C)  SpO2: 98%    Physical Exam Vitals and nursing note reviewed.  Constitutional:      General: Awake and alert. No acute distress.    Appearance: Normal appearance. The patient is normal weight.  HENT:     Head: Normocephalic and atraumatic.     Mouth: Mucous membranes are moist.  Eyes:     General: PERRL. Normal EOMs        Right eye: No discharge.        Left eye: No discharge.     Conjunctiva/sclera:  Conjunctivae normal.  Cardiovascular:     Rate and Rhythm: Normal rate and regular rhythm.     Pulses: Normal pulses.  Pulmonary:     Effort: Pulmonary effort is normal. No respiratory distress.  Able to speak easily in complete sentences, no accessory muscle use.    Breath sounds: Normal breath sounds.  Abdominal:     Abdomen is soft. There is no abdominal tenderness. No rebound or guarding. No distention. Musculoskeletal:        General: No swelling. Normal range of motion.     Cervical back: Normal range of motion and neck supple.  Skin:    General: Skin is warm and dry.     Capillary Refill: Capillary refill takes less than 2 seconds.     Findings: No rash.  Neurological:     Mental Status: The patient is awake and alert.      ED Results / Procedures / Treatments   Labs (all labs ordered are listed, but only abnormal results are displayed) Labs Reviewed  RESP PANEL  BY RT-PCR (RSV, FLU A&B, COVID)  RVPGX2 - Abnormal; Notable for the following components:      Result Value   Influenza A by PCR POSITIVE (*)    All other components within normal limits  BASIC METABOLIC PANEL WITH GFR - Abnormal; Notable for the following components:   Glucose, Bld 130 (*)    Creatinine, Ser 1.43 (*)    Calcium 8.6 (*)    GFR, Estimated 44 (*)    All other components within normal limits  CBC WITH DIFFERENTIAL/PLATELET - Abnormal; Notable for the following components:   Neutro Abs 7.8 (*)    All other components within normal limits  GROUP A STREP BY PCR  CK     EKG     RADIOLOGY I independently reviewed and interpreted imaging and agree with radiologists findings.     PROCEDURES:  Critical Care performed:   Procedures   MEDICATIONS ORDERED IN ED: Medications  sodium chloride  0.9 % bolus 1,000 mL (0 mLs Intravenous Stopped 02/22/24 0955)  acetaminophen  (TYLENOL ) tablet 500 mg (500 mg Oral Given 02/22/24 0817)  oseltamivir  (TAMIFLU ) capsule 30 mg (30 mg Oral Given 02/22/24 0920)     IMPRESSION / MDM / ASSESSMENT AND PLAN / ED COURSE  I reviewed the triage vital signs and the nursing notes.   Differential diagnosis includes, but is not limited to, influenza, strep pharyngitis, bronchitis, pneumonia, other URI.  Patient is awake and alert, hemodynamically stable and afebrile.  She is nontoxic in appearance.  Labs were obtained which are overall reassuring.  She does have a mild AKI, she was given a liter of normal saline for dehydration.  She was also given a small dose of Tylenol , advised that she cannot exceed 4 g in 24 hours and made aware that DayQuil/NyQuil also has Tylenol  in it.  Her swab returned positive for influenza A which I discussed with her at length.  CK obtained given family history of viral myositis, this was negative.  Chest x-ray ordered in triage is negative for cardiopulmonary abnormality.  Lungs are clear to auscultation  bilaterally.  She requested Tamiflu , we discussed the side effects of Tamiflu .  She requested the first dose of Tamiflu  to be given to her in the emergency department.  Creatinine clearance calculated, her creatinine clearance is 58.  Her Tamiflu  was renally dosed.  She was given the remaining 4 days sent to her pharmacy and advised to start it tomorrow.  We discussed symptomatic management and return precautions.  Patient understands and agrees with plan.  She was discharged in stable condition.  Patient's presentation is most consistent with acute complicated illness / injury requiring diagnostic workup.   Clinical Course as of 02/22/24 1223  Sun Feb 22, 2024  0851 Mom reports that daughter had viral myositis when she had flu, and she would like to be checked for this as well [JP]  [phone] Mom is requesting first dose of Tamiflu  [JP]    Clinical Course User Index [JP] Katiria Calame E, PA-C     FINAL CLINICAL IMPRESSION(S) / ED DIAGNOSES   Final diagnoses:  Influenza A     Rx / DC Orders   ED Discharge Orders          Ordered    oseltamivir  (TAMIFLU ) 30 MG capsule  2 times daily        02/22/24 0940             Note:  This document was prepared using Dragon voice recognition software and may include unintentional dictation errors.   Naevia Unterreiner E, PA-C 02/22/24 1223    Goodman, Graydon, MD 02/22/24 1432  "

## 2024-02-22 NOTE — Discharge Instructions (Signed)
 Your blood work was reassuring today.  You were given 1 L of normal saline to help with your symptoms.  Your flu test was positive for influenza A.  You were given the first dose of your Tamiflu , and the rest was sent to the pharmacy.  You may take this beginning tomorrow as you are already given today's dose.  You may also take Tylenol /ibuprofen  per package instructions to help with your symptoms.  Please return for any new, worsening, or changing symptoms or other concerns.  It was a pleasure caring for you today.

## 2024-02-22 NOTE — ED Triage Notes (Signed)
 Pt to ED with husband and daughter for possible influenza. Daughter has been exposed recently. Pt has body aches, dry cough, nasal congestion since about 2 days ago. Respirations are unlabored.

## 2024-04-06 ENCOUNTER — Ambulatory Visit: Admitting: Physical Therapy

## 2024-08-17 ENCOUNTER — Ambulatory Visit: Admitting: Neurology
